# Patient Record
Sex: Female | Born: 1945
Health system: Southern US, Community
[De-identification: ages and names within clinical notes are randomized; demographics above are authoritative.]

## PROBLEM LIST (undated history)

## (undated) DIAGNOSIS — N200 Calculus of kidney: Secondary | ICD-10-CM

## (undated) DIAGNOSIS — B019 Varicella without complication: Secondary | ICD-10-CM

## (undated) DIAGNOSIS — L570 Actinic keratosis: Secondary | ICD-10-CM

## (undated) DIAGNOSIS — T7840XA Allergy, unspecified, initial encounter: Secondary | ICD-10-CM

## (undated) HISTORY — PX: SHOULDER SURGERY: SHX246

## (undated) HISTORY — DX: Calculus of kidney: N20.0

## (undated) HISTORY — PX: KNEE ARTHROCENTESIS: SUR44

## (undated) HISTORY — DX: Allergy, unspecified, initial encounter: T78.40XA

## (undated) HISTORY — DX: Varicella without complication: B01.9

## (undated) HISTORY — DX: Actinic keratosis: L57.0

## (undated) HISTORY — PX: TONSILLECTOMY: SUR1361

---

## 1998-06-05 ENCOUNTER — Other Ambulatory Visit: Admission: RE | Admit: 1998-06-05 | Discharge: 1998-06-05 | Payer: Self-pay | Admitting: Obstetrics and Gynecology

## 1999-07-02 ENCOUNTER — Other Ambulatory Visit: Admission: RE | Admit: 1999-07-02 | Discharge: 1999-07-02 | Payer: Self-pay | Admitting: Obstetrics and Gynecology

## 2001-01-05 ENCOUNTER — Other Ambulatory Visit: Admission: RE | Admit: 2001-01-05 | Discharge: 2001-01-05 | Payer: Self-pay | Admitting: Obstetrics and Gynecology

## 2002-03-25 ENCOUNTER — Other Ambulatory Visit: Admission: RE | Admit: 2002-03-25 | Discharge: 2002-03-25 | Payer: Self-pay | Admitting: Obstetrics and Gynecology

## 2003-05-26 ENCOUNTER — Other Ambulatory Visit: Admission: RE | Admit: 2003-05-26 | Discharge: 2003-05-26 | Payer: Self-pay | Admitting: Obstetrics and Gynecology

## 2004-07-27 ENCOUNTER — Ambulatory Visit: Payer: Self-pay | Admitting: Obstetrics and Gynecology

## 2005-08-20 ENCOUNTER — Ambulatory Visit: Payer: Self-pay | Admitting: Obstetrics and Gynecology

## 2005-08-23 ENCOUNTER — Ambulatory Visit: Payer: Self-pay | Admitting: Obstetrics and Gynecology

## 2005-09-18 ENCOUNTER — Other Ambulatory Visit: Admission: RE | Admit: 2005-09-18 | Discharge: 2005-09-18 | Payer: Self-pay | Admitting: Radiology

## 2009-09-12 DIAGNOSIS — C4491 Basal cell carcinoma of skin, unspecified: Secondary | ICD-10-CM

## 2009-09-12 HISTORY — DX: Basal cell carcinoma of skin, unspecified: C44.91

## 2011-01-11 ENCOUNTER — Encounter: Payer: Self-pay | Admitting: Family Medicine

## 2011-01-11 ENCOUNTER — Ambulatory Visit (INDEPENDENT_AMBULATORY_CARE_PROVIDER_SITE_OTHER): Payer: PRIVATE HEALTH INSURANCE | Admitting: Family Medicine

## 2011-01-11 VITALS — BP 125/81 | HR 72 | Temp 97.7°F | Ht 70.0 in | Wt 135.0 lb

## 2011-01-11 DIAGNOSIS — M25559 Pain in unspecified hip: Secondary | ICD-10-CM

## 2011-01-11 DIAGNOSIS — M25551 Pain in right hip: Secondary | ICD-10-CM

## 2011-01-11 NOTE — Patient Instructions (Signed)
Your history, exam are consistent with what started as a piriformis strain 6 months ago that has not been properly rehabilitated. Along the way you have developed overuse/weakness of hip external rotators, abductors, and flexors. Physical therapy is the most important part of your treatment at this point and should help you significantly. Tennis ball massage is something to consider as well for 15 minutes at a time. Ice after doing physical therapy for 15 minutes at a time. Absolutely avoid hills and stairs as much as possible. Use pain as a guide for activities (walking, elliptical should be ok - would avoid running for the time being). Start with 3 sets of 10 of each exercise, advance to 20-30 then add ankle weights. Focus on hip abduction and hip rotation exercises until you start physical therapy. For piriformis stretches - pick 2 or 3 - hold for 20-30 seconds (do not push beyond stretch feeling) and do 3 times. Follow up with me in 6 weeks for a recheck on your status.

## 2011-01-11 NOTE — Progress Notes (Signed)
  Subjective:    Patient ID: Julie Rivera, female    DOB: July 15, 1945, 65 y.o.   MRN: [id number]  PCP: Dr. Halina Maidens  HPI 65 yo F here for right hip/buttock pain.  Patient is very active and plays tennis regularly. States about 6 months ago during a match she went to the side and felt a sharp pain in right buttock. Continued with current match and one more but had to stop. She rested, saw a physician at [facility] as well. Pain radiates at times down posterior right leg to the knee. No numbness or tingling. No back pain. No bowel/bladder dysfunction. Reports pain did improve after several weeks - went back to some like tennis then when she went to regular pace in tennis pain became severe again in this area. Went to a [facility] and the physician at [facility] - referred for physical therapy (hasn't started yet) and started on naprosyn. Noticed if she drinks plenty of fluids now, pain does not seem to radiate down posterior leg.  History reviewed. No pertinent past medical history.  No current outpatient prescriptions on file prior to visit.    Past Surgical History  Procedure Date  . Shoulder surgery     No Known Allergies  History   Social History  . Marital Status: Unknown    Spouse Name: N/A    Number of Children: N/A  . Years of Education: N/A   Occupational History  . Not on file.   Social History Main Topics  . Smoking status: Never Smoker   . Smokeless tobacco: Not on file  . Alcohol Use: Not on file  . Drug Use: Not on file  . Sexually Active: Not on file   Other Topics Concern  . Not on file   Social History Narrative  . No narrative on file    Family History  Problem Relation Age of Onset  . Hypertension Sister   . Sudden death Neg Hx   . Hyperlipidemia Neg Hx   . Heart attack Neg Hx   . Diabetes Neg Hx     BP 125/81  Pulse 72  Temp(Src) 97.7 F (36.5 C) (Oral)  Ht 5\' 10"  (1.778 m)  Wt 135 lb (61.236 kg)  BMI 19.37 kg/m2  Review of Systems See  HPI above.    Objective:   Physical Exam Gen: NAD  Back/R hip: No gross deformity, scoliosis. No paraspinal TTP.  No midline or bony TTP.  TTP deep right piriformis.  No tenderness hamstrings. FROM without pain. Strength 3+/5 right hip abduction, 4/5 with hip external rotation, 5-/5 with knee flexion at 90 and 30 degrees. 2+ MSRs in patellar and achilles tendons, equal bilaterally. Negative SLRs. Sensation intact to light touch bilaterally. Negative logroll bilateral hips Stretch in painful area reproduced with right piriformis stretch.  Negative fabers.  MSK u/s: No evidence bursal inflammation, muscle tear in location of pain deep at piriformis.  No abnormalities hamstring tendons.    Assessment & Plan:  1. Right hip pain - history and exam consistent with piriformis strain/spasms that she has not adequately rehabilitated to this point.  Associated weakness in right lower extremity muscle groups as well.  Discussed possibility of lumbar radiculopathy but no numbness/tingling and pain specific to deep in right buttock especially with her acute injury 6 months ago.  Start PT, strengthening exercises and stretches.  Continue with naproxen.  See instructions for further.

## 2011-01-11 NOTE — Assessment & Plan Note (Signed)
history and exam consistent with piriformis strain/spasms that she has not adequately rehabilitated to this point.  Associated weakness in right lower extremity muscle groups as well.  Discussed possibility of lumbar radiculopathy but no numbness/tingling and pain specific to deep in right buttock especially with her acute injury 6 months ago.  Start PT, strengthening exercises and stretches.  Continue with naproxen.  See instructions for further.

## 2011-02-18 ENCOUNTER — Other Ambulatory Visit: Payer: Self-pay | Admitting: Sports Medicine

## 2011-02-21 ENCOUNTER — Ambulatory Visit: Payer: PRIVATE HEALTH INSURANCE | Admitting: Family Medicine

## 2011-02-22 ENCOUNTER — Ambulatory Visit: Payer: PRIVATE HEALTH INSURANCE | Admitting: Family Medicine

## 2012-12-29 DIAGNOSIS — M25519 Pain in unspecified shoulder: Secondary | ICD-10-CM | POA: Insufficient documentation

## 2013-01-26 DIAGNOSIS — M7541 Impingement syndrome of right shoulder: Secondary | ICD-10-CM | POA: Insufficient documentation

## 2013-03-18 DIAGNOSIS — M7520 Bicipital tendinitis, unspecified shoulder: Secondary | ICD-10-CM | POA: Insufficient documentation

## 2013-03-18 DIAGNOSIS — M67919 Unspecified disorder of synovium and tendon, unspecified shoulder: Secondary | ICD-10-CM | POA: Insufficient documentation

## 2013-03-18 DIAGNOSIS — S43499A Other sprain of unspecified shoulder joint, initial encounter: Secondary | ICD-10-CM | POA: Insufficient documentation

## 2013-03-19 DIAGNOSIS — M75120 Complete rotator cuff tear or rupture of unspecified shoulder, not specified as traumatic: Secondary | ICD-10-CM | POA: Insufficient documentation

## 2013-05-10 DIAGNOSIS — Z1231 Encounter for screening mammogram for malignant neoplasm of breast: Secondary | ICD-10-CM | POA: Diagnosis not present

## 2013-06-01 DIAGNOSIS — Z1211 Encounter for screening for malignant neoplasm of colon: Secondary | ICD-10-CM | POA: Diagnosis not present

## 2013-06-01 DIAGNOSIS — K648 Other hemorrhoids: Secondary | ICD-10-CM | POA: Diagnosis not present

## 2013-06-01 DIAGNOSIS — K5730 Diverticulosis of large intestine without perforation or abscess without bleeding: Secondary | ICD-10-CM | POA: Diagnosis not present

## 2013-06-08 DIAGNOSIS — L820 Inflamed seborrheic keratosis: Secondary | ICD-10-CM | POA: Diagnosis not present

## 2013-06-08 DIAGNOSIS — Z85828 Personal history of other malignant neoplasm of skin: Secondary | ICD-10-CM | POA: Diagnosis not present

## 2013-07-15 DIAGNOSIS — M75120 Complete rotator cuff tear or rupture of unspecified shoulder, not specified as traumatic: Secondary | ICD-10-CM | POA: Diagnosis not present

## 2014-01-25 DIAGNOSIS — Z779 Other contact with and (suspected) exposures hazardous to health: Secondary | ICD-10-CM | POA: Diagnosis not present

## 2014-01-25 DIAGNOSIS — R231 Pallor: Secondary | ICD-10-CM | POA: Diagnosis not present

## 2014-03-07 DIAGNOSIS — D0439 Carcinoma in situ of skin of other parts of face: Secondary | ICD-10-CM | POA: Diagnosis not present

## 2014-03-07 DIAGNOSIS — L720 Epidermal cyst: Secondary | ICD-10-CM | POA: Diagnosis not present

## 2014-03-07 DIAGNOSIS — Z85828 Personal history of other malignant neoplasm of skin: Secondary | ICD-10-CM | POA: Diagnosis not present

## 2014-03-07 DIAGNOSIS — L578 Other skin changes due to chronic exposure to nonionizing radiation: Secondary | ICD-10-CM | POA: Diagnosis not present

## 2014-05-24 DIAGNOSIS — R002 Palpitations: Secondary | ICD-10-CM | POA: Diagnosis not present

## 2014-05-24 DIAGNOSIS — R079 Chest pain, unspecified: Secondary | ICD-10-CM | POA: Diagnosis not present

## 2014-05-30 DIAGNOSIS — Z1231 Encounter for screening mammogram for malignant neoplasm of breast: Secondary | ICD-10-CM | POA: Diagnosis not present

## 2014-06-07 DIAGNOSIS — R079 Chest pain, unspecified: Secondary | ICD-10-CM | POA: Diagnosis not present

## 2014-06-14 DIAGNOSIS — R079 Chest pain, unspecified: Secondary | ICD-10-CM | POA: Diagnosis not present

## 2014-06-14 DIAGNOSIS — I340 Nonrheumatic mitral (valve) insufficiency: Secondary | ICD-10-CM | POA: Diagnosis not present

## 2014-07-01 DIAGNOSIS — R921 Mammographic calcification found on diagnostic imaging of breast: Secondary | ICD-10-CM | POA: Diagnosis not present

## 2014-07-13 DIAGNOSIS — Z0000 Encounter for general adult medical examination without abnormal findings: Secondary | ICD-10-CM | POA: Diagnosis not present

## 2014-07-13 DIAGNOSIS — N6012 Diffuse cystic mastopathy of left breast: Secondary | ICD-10-CM | POA: Diagnosis not present

## 2014-07-13 DIAGNOSIS — R920 Mammographic microcalcification found on diagnostic imaging of breast: Secondary | ICD-10-CM | POA: Diagnosis not present

## 2014-07-26 DIAGNOSIS — H2513 Age-related nuclear cataract, bilateral: Secondary | ICD-10-CM | POA: Diagnosis not present

## 2014-08-04 DIAGNOSIS — M1990 Unspecified osteoarthritis, unspecified site: Secondary | ICD-10-CM | POA: Diagnosis not present

## 2014-08-04 DIAGNOSIS — M19019 Primary osteoarthritis, unspecified shoulder: Secondary | ICD-10-CM | POA: Insufficient documentation

## 2014-09-21 DIAGNOSIS — M23301 Other meniscus derangements, unspecified lateral meniscus, left knee: Secondary | ICD-10-CM | POA: Diagnosis not present

## 2014-09-21 DIAGNOSIS — M11262 Other chondrocalcinosis, left knee: Secondary | ICD-10-CM | POA: Diagnosis not present

## 2014-09-21 DIAGNOSIS — M25562 Pain in left knee: Secondary | ICD-10-CM | POA: Diagnosis not present

## 2014-09-23 DIAGNOSIS — M23301 Other meniscus derangements, unspecified lateral meniscus, left knee: Secondary | ICD-10-CM | POA: Diagnosis not present

## 2014-09-23 DIAGNOSIS — M25562 Pain in left knee: Secondary | ICD-10-CM | POA: Diagnosis not present

## 2014-09-23 DIAGNOSIS — S83289A Other tear of lateral meniscus, current injury, unspecified knee, initial encounter: Secondary | ICD-10-CM | POA: Diagnosis not present

## 2014-10-05 DIAGNOSIS — S83282D Other tear of lateral meniscus, current injury, left knee, subsequent encounter: Secondary | ICD-10-CM | POA: Diagnosis not present

## 2014-10-05 DIAGNOSIS — M794 Hypertrophy of (infrapatellar) fat pad: Secondary | ICD-10-CM | POA: Diagnosis not present

## 2014-10-18 DIAGNOSIS — M65862 Other synovitis and tenosynovitis, left lower leg: Secondary | ICD-10-CM | POA: Diagnosis not present

## 2014-10-18 DIAGNOSIS — M2242 Chondromalacia patellae, left knee: Secondary | ICD-10-CM | POA: Diagnosis not present

## 2014-10-18 DIAGNOSIS — M6752 Plica syndrome, left knee: Secondary | ICD-10-CM | POA: Diagnosis not present

## 2014-10-18 DIAGNOSIS — S83282A Other tear of lateral meniscus, current injury, left knee, initial encounter: Secondary | ICD-10-CM | POA: Diagnosis not present

## 2014-10-18 DIAGNOSIS — M11262 Other chondrocalcinosis, left knee: Secondary | ICD-10-CM | POA: Diagnosis not present

## 2014-10-18 DIAGNOSIS — M1120 Other chondrocalcinosis, unspecified site: Secondary | ICD-10-CM | POA: Diagnosis not present

## 2014-10-18 DIAGNOSIS — M23242 Derangement of anterior horn of lateral meniscus due to old tear or injury, left knee: Secondary | ICD-10-CM | POA: Diagnosis not present

## 2014-10-18 DIAGNOSIS — S83282D Other tear of lateral meniscus, current injury, left knee, subsequent encounter: Secondary | ICD-10-CM | POA: Diagnosis not present

## 2014-10-18 DIAGNOSIS — M794 Hypertrophy of (infrapatellar) fat pad: Secondary | ICD-10-CM | POA: Diagnosis not present

## 2014-11-30 DIAGNOSIS — M79645 Pain in left finger(s): Secondary | ICD-10-CM | POA: Diagnosis not present

## 2014-11-30 DIAGNOSIS — M189 Osteoarthritis of first carpometacarpal joint, unspecified: Secondary | ICD-10-CM | POA: Diagnosis not present

## 2015-01-11 DIAGNOSIS — M659 Synovitis and tenosynovitis, unspecified: Secondary | ICD-10-CM | POA: Diagnosis not present

## 2015-02-08 DIAGNOSIS — M794 Hypertrophy of (infrapatellar) fat pad: Secondary | ICD-10-CM | POA: Diagnosis not present

## 2015-02-08 DIAGNOSIS — S83282D Other tear of lateral meniscus, current injury, left knee, subsequent encounter: Secondary | ICD-10-CM | POA: Diagnosis not present

## 2015-02-08 DIAGNOSIS — M2242 Chondromalacia patellae, left knee: Secondary | ICD-10-CM | POA: Diagnosis not present

## 2015-02-08 DIAGNOSIS — M6752 Plica syndrome, left knee: Secondary | ICD-10-CM | POA: Diagnosis not present

## 2015-02-08 DIAGNOSIS — M1120 Other chondrocalcinosis, unspecified site: Secondary | ICD-10-CM | POA: Diagnosis not present

## 2015-02-22 DIAGNOSIS — M899 Disorder of bone, unspecified: Secondary | ICD-10-CM | POA: Diagnosis not present

## 2015-02-23 DIAGNOSIS — Z23 Encounter for immunization: Secondary | ICD-10-CM | POA: Diagnosis not present

## 2015-02-28 DIAGNOSIS — G8929 Other chronic pain: Secondary | ICD-10-CM | POA: Diagnosis not present

## 2015-02-28 DIAGNOSIS — M25562 Pain in left knee: Secondary | ICD-10-CM | POA: Diagnosis not present

## 2015-03-21 DIAGNOSIS — M25562 Pain in left knee: Secondary | ICD-10-CM | POA: Diagnosis not present

## 2015-03-21 DIAGNOSIS — G8929 Other chronic pain: Secondary | ICD-10-CM | POA: Diagnosis not present

## 2015-04-07 DIAGNOSIS — H2513 Age-related nuclear cataract, bilateral: Secondary | ICD-10-CM | POA: Diagnosis not present

## 2015-04-09 HISTORY — PX: BACK SURGERY: SHX140

## 2015-04-19 DIAGNOSIS — Z1231 Encounter for screening mammogram for malignant neoplasm of breast: Secondary | ICD-10-CM | POA: Diagnosis not present

## 2015-04-25 DIAGNOSIS — Z23 Encounter for immunization: Secondary | ICD-10-CM | POA: Diagnosis not present

## 2015-05-04 DIAGNOSIS — M7052 Other bursitis of knee, left knee: Secondary | ICD-10-CM | POA: Diagnosis not present

## 2015-08-03 DIAGNOSIS — M5416 Radiculopathy, lumbar region: Secondary | ICD-10-CM | POA: Diagnosis not present

## 2015-08-03 DIAGNOSIS — M47896 Other spondylosis, lumbar region: Secondary | ICD-10-CM | POA: Diagnosis not present

## 2015-08-07 DIAGNOSIS — M545 Low back pain: Secondary | ICD-10-CM | POA: Diagnosis not present

## 2015-08-15 DIAGNOSIS — M545 Low back pain: Secondary | ICD-10-CM | POA: Diagnosis not present

## 2015-08-22 DIAGNOSIS — M545 Low back pain: Secondary | ICD-10-CM | POA: Diagnosis not present

## 2015-09-07 DIAGNOSIS — M545 Low back pain: Secondary | ICD-10-CM | POA: Diagnosis not present

## 2015-09-22 DIAGNOSIS — M5416 Radiculopathy, lumbar region: Secondary | ICD-10-CM | POA: Diagnosis not present

## 2015-10-17 ENCOUNTER — Other Ambulatory Visit: Payer: Self-pay | Admitting: Orthopedic Surgery

## 2015-10-17 DIAGNOSIS — M47816 Spondylosis without myelopathy or radiculopathy, lumbar region: Secondary | ICD-10-CM

## 2015-11-03 DIAGNOSIS — M47896 Other spondylosis, lumbar region: Secondary | ICD-10-CM | POA: Diagnosis not present

## 2015-11-09 DIAGNOSIS — S83282D Other tear of lateral meniscus, current injury, left knee, subsequent encounter: Secondary | ICD-10-CM | POA: Diagnosis not present

## 2015-11-09 DIAGNOSIS — M6752 Plica syndrome, left knee: Secondary | ICD-10-CM | POA: Diagnosis not present

## 2015-11-09 DIAGNOSIS — M2242 Chondromalacia patellae, left knee: Secondary | ICD-10-CM | POA: Diagnosis not present

## 2015-11-09 DIAGNOSIS — M47896 Other spondylosis, lumbar region: Secondary | ICD-10-CM | POA: Diagnosis not present

## 2015-11-09 DIAGNOSIS — M5416 Radiculopathy, lumbar region: Secondary | ICD-10-CM | POA: Diagnosis not present

## 2015-11-25 DIAGNOSIS — Z91030 Bee allergy status: Secondary | ICD-10-CM | POA: Diagnosis not present

## 2015-12-07 DIAGNOSIS — M5416 Radiculopathy, lumbar region: Secondary | ICD-10-CM | POA: Diagnosis not present

## 2015-12-07 DIAGNOSIS — M4807 Spinal stenosis, lumbosacral region: Secondary | ICD-10-CM | POA: Diagnosis not present

## 2015-12-20 DIAGNOSIS — Z23 Encounter for immunization: Secondary | ICD-10-CM | POA: Diagnosis not present

## 2016-01-02 DIAGNOSIS — M5416 Radiculopathy, lumbar region: Secondary | ICD-10-CM | POA: Diagnosis not present

## 2016-01-02 DIAGNOSIS — M7130 Other bursal cyst, unspecified site: Secondary | ICD-10-CM | POA: Diagnosis not present

## 2016-01-02 DIAGNOSIS — M47896 Other spondylosis, lumbar region: Secondary | ICD-10-CM | POA: Diagnosis not present

## 2016-01-05 DIAGNOSIS — L578 Other skin changes due to chronic exposure to nonionizing radiation: Secondary | ICD-10-CM | POA: Diagnosis not present

## 2016-01-05 DIAGNOSIS — Z85828 Personal history of other malignant neoplasm of skin: Secondary | ICD-10-CM | POA: Diagnosis not present

## 2016-01-05 DIAGNOSIS — L570 Actinic keratosis: Secondary | ICD-10-CM | POA: Diagnosis not present

## 2016-01-09 DIAGNOSIS — R030 Elevated blood-pressure reading, without diagnosis of hypertension: Secondary | ICD-10-CM | POA: Diagnosis not present

## 2016-01-09 DIAGNOSIS — Z681 Body mass index (BMI) 19 or less, adult: Secondary | ICD-10-CM | POA: Diagnosis not present

## 2016-01-09 DIAGNOSIS — M7138 Other bursal cyst, other site: Secondary | ICD-10-CM | POA: Diagnosis not present

## 2016-01-09 DIAGNOSIS — M4316 Spondylolisthesis, lumbar region: Secondary | ICD-10-CM | POA: Diagnosis not present

## 2016-01-09 DIAGNOSIS — M48062 Spinal stenosis, lumbar region with neurogenic claudication: Secondary | ICD-10-CM | POA: Diagnosis not present

## 2016-01-09 DIAGNOSIS — M5416 Radiculopathy, lumbar region: Secondary | ICD-10-CM | POA: Diagnosis not present

## 2016-02-12 DIAGNOSIS — M1120 Other chondrocalcinosis, unspecified site: Secondary | ICD-10-CM | POA: Diagnosis not present

## 2016-02-12 DIAGNOSIS — M25562 Pain in left knee: Secondary | ICD-10-CM | POA: Diagnosis not present

## 2016-02-12 DIAGNOSIS — S83282D Other tear of lateral meniscus, current injury, left knee, subsequent encounter: Secondary | ICD-10-CM | POA: Diagnosis not present

## 2016-02-12 DIAGNOSIS — M1712 Unilateral primary osteoarthritis, left knee: Secondary | ICD-10-CM | POA: Diagnosis not present

## 2016-02-12 DIAGNOSIS — M2242 Chondromalacia patellae, left knee: Secondary | ICD-10-CM | POA: Diagnosis not present

## 2016-02-15 DIAGNOSIS — M7138 Other bursal cyst, other site: Secondary | ICD-10-CM | POA: Diagnosis not present

## 2016-02-15 DIAGNOSIS — Z981 Arthrodesis status: Secondary | ICD-10-CM | POA: Diagnosis not present

## 2016-02-15 DIAGNOSIS — M48061 Spinal stenosis, lumbar region without neurogenic claudication: Secondary | ICD-10-CM | POA: Diagnosis not present

## 2016-02-16 DIAGNOSIS — M7138 Other bursal cyst, other site: Secondary | ICD-10-CM | POA: Diagnosis not present

## 2016-02-16 DIAGNOSIS — M48061 Spinal stenosis, lumbar region without neurogenic claudication: Secondary | ICD-10-CM | POA: Diagnosis not present

## 2016-03-06 DIAGNOSIS — H35363 Drusen (degenerative) of macula, bilateral: Secondary | ICD-10-CM | POA: Diagnosis not present

## 2016-03-06 DIAGNOSIS — H2513 Age-related nuclear cataract, bilateral: Secondary | ICD-10-CM | POA: Diagnosis not present

## 2016-03-06 DIAGNOSIS — H25013 Cortical age-related cataract, bilateral: Secondary | ICD-10-CM | POA: Diagnosis not present

## 2016-03-14 ENCOUNTER — Ambulatory Visit (INDEPENDENT_AMBULATORY_CARE_PROVIDER_SITE_OTHER): Payer: Medicare Other | Admitting: Family

## 2016-03-14 ENCOUNTER — Encounter: Payer: Self-pay | Admitting: Family

## 2016-03-14 VITALS — BP 116/86 | HR 79 | Temp 97.6°F | Ht 69.0 in | Wt 139.2 lb

## 2016-03-14 DIAGNOSIS — G4700 Insomnia, unspecified: Secondary | ICD-10-CM | POA: Diagnosis not present

## 2016-03-14 DIAGNOSIS — Z7689 Persons encountering health services in other specified circumstances: Secondary | ICD-10-CM

## 2016-03-14 DIAGNOSIS — Z136 Encounter for screening for cardiovascular disorders: Secondary | ICD-10-CM | POA: Insufficient documentation

## 2016-03-14 NOTE — Assessment & Plan Note (Signed)
Reviewed past medical, social, family history with patient. She'll return for CPE in 2018.

## 2016-03-14 NOTE — Progress Notes (Signed)
Subjective:    Patient ID: Julie Rivera, female    DOB: 30-Aug-1945, 70 y.o.   MRN: [id number]  CC: Julie Rivera is a 70 y.o. female who presents today to establish care.    HPI: Here to establish care as new patient. No prior PCP.   Insomnia- Wilburn Cornelia once every 2 months when husband's snoring 'keeps her up'. No refills needed at this time. No strange dreams, grogginess from medication.   Low back pain- chronic.  follows with orthopedic, Dr Jefferey Pica.3 weeks ago cyst removed from lumbar spine. Takes tramadol PRN.   Follows with GYN, Dr. Elvis Coil who prescribed HRT. Will do pap with her next year.      HISTORY:  Past Medical History:  Diagnosis Date  . Allergy   . Chicken pox   . Kidney stones    Past Surgical History:  Procedure Laterality Date  . BACK SURGERY    . KNEE ARTHROCENTESIS    . SHOULDER SURGERY     Family History  Problem Relation Age of Onset  . Hypertension Sister   . Sudden death Neg Hx   . Hyperlipidemia Neg Hx   . Heart attack Neg Hx   . Diabetes Neg Hx     Allergies: Patient has no known allergies. No current outpatient prescriptions on file prior to visit.   No current facility-administered medications on file prior to visit.     Social History  Substance Use Topics  . Smoking status: Never Smoker  . Smokeless tobacco: Never Used  . Alcohol use No    Review of Systems  Constitutional: Negative for chills and fever.  Respiratory: Negative for cough.   Cardiovascular: Negative for chest pain and palpitations.  Gastrointestinal: Negative for nausea and vomiting.  Psychiatric/Behavioral: Positive for sleep disturbance (occasional).      Objective:    BP 116/86   Pulse 79   Temp 97.6 F (36.4 C) (Oral)   Ht 5\' 9"  (1.753 m)   Wt 139 lb 3.2 oz (63.1 kg)   LMP 02/07/2016   SpO2 98%   BMI 20.56 kg/m  BP Readings from Last 3 Encounters:  03/14/16 116/86  01/11/11 125/81   Wt Readings from Last 3 Encounters:    03/14/16 139 lb 3.2 oz (63.1 kg)  01/11/11 135 lb (61.2 kg)    Physical Exam  Constitutional: She appears well-developed and well-nourished.  Eyes: Conjunctivae are normal.  Cardiovascular: Normal rate, regular rhythm, normal heart sounds and normal pulses.   Pulmonary/Chest: Effort normal and breath sounds normal. She has no wheezes. She has no rhonchi. She has no rales.  Neurological: She is alert.  Skin: Skin is warm and dry.  Psychiatric: She has a normal mood and affect. Her speech is normal and behavior is normal. Thought content normal.  Vitals reviewed.      Assessment & Plan:   Problem List Items Addressed This Visit      Other   Insomnia - Primary    Wellcontrolled with occasional use of Ambien. No AE. Will continue medication.      Encounter to establish care    Reviewed past medical, social, family history with patient. She'll return for CPE in 2018.          I have discontinued Ms. Paci's calcium carbonate, fish oil-omega-3 fatty acids, multivitamin, and naproxen. I am also having her maintain her conjugated estrogens-medroxyprogesteron, zolpidem, and traMADol.   Meds ordered this encounter  Medications  . conjugated estrogens-medroxyprogesteron (PREMPHASE) TABS  tablet    Sig: Take by mouth.  . zolpidem (AMBIEN) 5 MG tablet    Sig: Take 5 mg by mouth at bedtime as needed for sleep.  . traMADol (ULTRAM) 50 MG tablet    Sig: Take by mouth every 6 (six) hours as needed.    Return precautions given.   Risks, benefits, and alternatives of the medications and treatment plan prescribed today were discussed, and patient expressed understanding.   Education regarding symptom management and diagnosis given to patient on AVS.  Continue to follow with Mable Paris, FNP for routine health maintenance.   Julie Rivera and I agreed with plan.   Mable Paris, FNP

## 2016-03-14 NOTE — Assessment & Plan Note (Addendum)
Well controlled with occasional use of Ambien. No AE. Will continue medication.

## 2016-03-14 NOTE — Patient Instructions (Signed)
Pleasure meeting you.    Physical with fasting  Labs next year.

## 2016-03-14 NOTE — Progress Notes (Signed)
Pre visit review using our clinic review tool, if applicable. No additional management support is needed unless otherwise documented below in the visit note. 

## 2016-03-18 ENCOUNTER — Telehealth: Payer: Self-pay | Admitting: Family

## 2016-03-18 ENCOUNTER — Encounter: Payer: Self-pay | Admitting: Family

## 2016-03-18 DIAGNOSIS — Z79890 Hormone replacement therapy (postmenopausal): Secondary | ICD-10-CM

## 2016-03-18 NOTE — Telephone Encounter (Signed)
Patient stated she was seen last week to est care.  Patient stated she was going to stop going to OB/GYN. Please advise.

## 2016-03-18 NOTE — Telephone Encounter (Signed)
Pt called and was wondering if M. Arnett would refill a medication that she was getting from her Ob/Gyn. IT is conjugated estrogens-medroxyprogesteron (PREMPHASE) TABS tablet. Please advise, thank you!  Call pt @ [phone]

## 2016-03-18 NOTE — Telephone Encounter (Signed)
Ok to do so

## 2016-03-18 NOTE — Telephone Encounter (Signed)
Pt will need to schedule an appt to discuss HRT Sorry- just need to know entire h/o this before I am confortable prescribing. Otherwise, she could stay with GYN

## 2016-03-19 ENCOUNTER — Encounter: Payer: Self-pay | Admitting: Family

## 2016-03-19 DIAGNOSIS — Z79890 Hormone replacement therapy (postmenopausal): Secondary | ICD-10-CM | POA: Insufficient documentation

## 2016-03-19 MED ORDER — CONJ ESTROG-MEDROXYPROGEST ACE PO TABS: 1.0000 | ORAL_TABLET | Freq: Every day | ORAL | 1 refills | Status: DC

## 2016-03-19 NOTE — Telephone Encounter (Signed)
Spoke with pt-  Started HRT 56 yrs for hot flashes and wanted benefit of bone health which is 'worth the risk'.  No personal or family h/o ovarian, breast cancer.   Doesn't desire to see GYN anymore. Dr. Bayard MalesRonnald Ramp. Would like premphase refill from me.   Continues to have period since she started premphase 15+ years ago. Advised call GYN as concerned that this is abnormal.  She will call GYN and make an appt to refill premphase.   I dc/ed refill. pateint verbalized understanding

## 2016-03-25 DIAGNOSIS — M1712 Unilateral primary osteoarthritis, left knee: Secondary | ICD-10-CM | POA: Diagnosis not present

## 2016-03-25 DIAGNOSIS — M2242 Chondromalacia patellae, left knee: Secondary | ICD-10-CM | POA: Diagnosis not present

## 2016-03-25 DIAGNOSIS — S83282D Other tear of lateral meniscus, current injury, left knee, subsequent encounter: Secondary | ICD-10-CM | POA: Diagnosis not present

## 2016-03-27 DIAGNOSIS — R232 Flushing: Secondary | ICD-10-CM | POA: Diagnosis not present

## 2016-04-22 DIAGNOSIS — C4492 Squamous cell carcinoma of skin, unspecified: Secondary | ICD-10-CM

## 2016-04-22 DIAGNOSIS — L578 Other skin changes due to chronic exposure to nonionizing radiation: Secondary | ICD-10-CM | POA: Diagnosis not present

## 2016-04-22 DIAGNOSIS — Z1283 Encounter for screening for malignant neoplasm of skin: Secondary | ICD-10-CM | POA: Diagnosis not present

## 2016-04-22 DIAGNOSIS — Z85828 Personal history of other malignant neoplasm of skin: Secondary | ICD-10-CM | POA: Diagnosis not present

## 2016-04-22 DIAGNOSIS — D692 Other nonthrombocytopenic purpura: Secondary | ICD-10-CM | POA: Diagnosis not present

## 2016-04-22 DIAGNOSIS — L570 Actinic keratosis: Secondary | ICD-10-CM | POA: Diagnosis not present

## 2016-04-22 DIAGNOSIS — D485 Neoplasm of uncertain behavior of skin: Secondary | ICD-10-CM | POA: Diagnosis not present

## 2016-04-22 DIAGNOSIS — L820 Inflamed seborrheic keratosis: Secondary | ICD-10-CM | POA: Diagnosis not present

## 2016-04-22 DIAGNOSIS — L853 Xerosis cutis: Secondary | ICD-10-CM | POA: Diagnosis not present

## 2016-04-22 DIAGNOSIS — D0471 Carcinoma in situ of skin of right lower limb, including hip: Secondary | ICD-10-CM | POA: Diagnosis not present

## 2016-04-22 HISTORY — DX: Squamous cell carcinoma of skin, unspecified: C44.92

## 2016-04-24 DIAGNOSIS — M25551 Pain in right hip: Secondary | ICD-10-CM | POA: Diagnosis not present

## 2016-04-24 DIAGNOSIS — M545 Low back pain: Secondary | ICD-10-CM | POA: Diagnosis not present

## 2016-04-26 DIAGNOSIS — Z1231 Encounter for screening mammogram for malignant neoplasm of breast: Secondary | ICD-10-CM | POA: Diagnosis not present

## 2016-04-29 DIAGNOSIS — M545 Low back pain: Secondary | ICD-10-CM | POA: Diagnosis not present

## 2016-04-29 DIAGNOSIS — M25551 Pain in right hip: Secondary | ICD-10-CM | POA: Diagnosis not present

## 2016-05-01 DIAGNOSIS — M545 Low back pain: Secondary | ICD-10-CM | POA: Diagnosis not present

## 2016-05-01 DIAGNOSIS — M25551 Pain in right hip: Secondary | ICD-10-CM | POA: Diagnosis not present

## 2016-05-06 DIAGNOSIS — M25551 Pain in right hip: Secondary | ICD-10-CM | POA: Diagnosis not present

## 2016-05-06 DIAGNOSIS — M545 Low back pain: Secondary | ICD-10-CM | POA: Diagnosis not present

## 2016-05-08 DIAGNOSIS — M25551 Pain in right hip: Secondary | ICD-10-CM | POA: Diagnosis not present

## 2016-05-08 DIAGNOSIS — M545 Low back pain: Secondary | ICD-10-CM | POA: Diagnosis not present

## 2016-05-20 DIAGNOSIS — M25551 Pain in right hip: Secondary | ICD-10-CM | POA: Diagnosis not present

## 2016-05-20 DIAGNOSIS — M545 Low back pain: Secondary | ICD-10-CM | POA: Diagnosis not present

## 2016-05-29 DIAGNOSIS — M25551 Pain in right hip: Secondary | ICD-10-CM | POA: Diagnosis not present

## 2016-05-29 DIAGNOSIS — M545 Low back pain: Secondary | ICD-10-CM | POA: Diagnosis not present

## 2016-06-03 DIAGNOSIS — S83282D Other tear of lateral meniscus, current injury, left knee, subsequent encounter: Secondary | ICD-10-CM | POA: Diagnosis not present

## 2016-06-03 DIAGNOSIS — M25551 Pain in right hip: Secondary | ICD-10-CM | POA: Diagnosis not present

## 2016-06-03 DIAGNOSIS — M1120 Other chondrocalcinosis, unspecified site: Secondary | ICD-10-CM | POA: Diagnosis not present

## 2016-06-03 DIAGNOSIS — M2242 Chondromalacia patellae, left knee: Secondary | ICD-10-CM | POA: Diagnosis not present

## 2016-06-03 DIAGNOSIS — M1712 Unilateral primary osteoarthritis, left knee: Secondary | ICD-10-CM | POA: Diagnosis not present

## 2016-06-03 DIAGNOSIS — M545 Low back pain: Secondary | ICD-10-CM | POA: Diagnosis not present

## 2016-06-05 ENCOUNTER — Telehealth: Payer: Self-pay | Admitting: Family

## 2016-06-05 NOTE — Telephone Encounter (Signed)
I left pt a vm to call the office to sch AWV. Thank you!

## 2016-06-10 DIAGNOSIS — M545 Low back pain: Secondary | ICD-10-CM | POA: Diagnosis not present

## 2016-06-10 DIAGNOSIS — M25551 Pain in right hip: Secondary | ICD-10-CM | POA: Diagnosis not present

## 2016-06-12 DIAGNOSIS — M545 Low back pain: Secondary | ICD-10-CM | POA: Diagnosis not present

## 2016-06-12 DIAGNOSIS — M25551 Pain in right hip: Secondary | ICD-10-CM | POA: Diagnosis not present

## 2016-06-17 DIAGNOSIS — M25551 Pain in right hip: Secondary | ICD-10-CM | POA: Diagnosis not present

## 2016-06-17 DIAGNOSIS — M545 Low back pain: Secondary | ICD-10-CM | POA: Diagnosis not present

## 2016-06-19 DIAGNOSIS — M545 Low back pain: Secondary | ICD-10-CM | POA: Diagnosis not present

## 2016-06-19 DIAGNOSIS — M25551 Pain in right hip: Secondary | ICD-10-CM | POA: Diagnosis not present

## 2016-06-24 DIAGNOSIS — M545 Low back pain: Secondary | ICD-10-CM | POA: Diagnosis not present

## 2016-06-24 DIAGNOSIS — D0471 Carcinoma in situ of skin of right lower limb, including hip: Secondary | ICD-10-CM | POA: Diagnosis not present

## 2016-06-24 DIAGNOSIS — M25551 Pain in right hip: Secondary | ICD-10-CM | POA: Diagnosis not present

## 2016-06-30 NOTE — Progress Notes (Signed)
Subjective:    Patient ID: Julie Rivera, female    DOB: Mar 17, 1946, 71 y.o.   MRN: [id number]  CC: Julie Rivera is a 71 y.o. female who presents today for physical exam.    HPI: Insomnia- stable. Takes [place] as needed  HRT- still receives from [employer]. She started this medication years ago for bone health.   Low  Back pain- Takes PRN tramadol for chronic low back pain. Follows with Dr Jefferey Pica      Colorectal Cancer Screening: UTD 2017, no polyps. Returns in 10 years.  Breast Cancer Screening: Sent to [facility] 04/2016 Normal.  Cervical Cancer Screening: No longer. Last pap 2 years and states normal.  Bone Health screening/DEXA for 65+: No increased fracture risk. Had DEXA 20 years ago.  Lung Cancer Screening: Doesn't have 30 year pack year history and age > 1 years.       Tetanus - Due        Pneumococcal - Had pneumococcal vaccine at [facility].  Hepatitis C screening - Candidate for; declines  Labs: declines due to concern of Medicare  Not paying for.  Exercise: Gets regular exercise.  Alcohol use: None Smoking/tobacco use: Nonsmoker.  Regular dental exams: UTD Wears seat belt: Yes. Skin: Follows with dermatology. No melanoma.   HISTORY:  Past Medical History:  Diagnosis Date  . Allergy   . Chicken pox   . Kidney stones     Past Surgical History:  Procedure Laterality Date  . BACK SURGERY  2017  . KNEE ARTHROCENTESIS    . SHOULDER SURGERY     Family History  Problem Relation Age of Onset  . Hypertension Sister   . Sudden death Neg Hx   . Hyperlipidemia Neg Hx   . Heart attack Neg Hx   . Diabetes Neg Hx   . Ovarian cancer Neg Hx   . Breast cancer Neg Hx       ALLERGIES: Patient has no known allergies.  Current Outpatient Prescriptions on File Prior to Visit  Medication Sig Dispense Refill  . traMADol (ULTRAM) 50 MG tablet Take by mouth every 6 (six) hours as needed.    . zolpidem (AMBIEN) 5 MG tablet Take 5 mg by mouth at bedtime as needed  for sleep.     No current facility-administered medications on file prior to visit.     Social History  Substance Use Topics  . Smoking status: Never Smoker  . Smokeless tobacco: Never Used  . Alcohol use No    Review of Systems  Constitutional: Negative for chills, fever and unexpected weight change.  HENT: Negative for congestion.   Respiratory: Negative for cough.   Cardiovascular: Negative for chest pain, palpitations and leg swelling.  Gastrointestinal: Negative for nausea and vomiting.  Musculoskeletal: Negative for arthralgias and myalgias.  Skin: Negative for rash.  Neurological: Negative for headaches.  Hematological: Negative for adenopathy.  Psychiatric/Behavioral: Negative for confusion.      Objective:    BP 134/78   Pulse 63   Temp 97.6 F (36.4 C) (Oral)   Ht 5\' 9"  (1.753 m)   Wt 139 lb 14.4 oz (63.5 kg)   SpO2 97%   BMI 20.66 kg/m   BP Readings from Last 3 Encounters:  07/01/16 134/78  03/14/16 116/86  01/11/11 125/81   Wt Readings from Last 3 Encounters:  07/01/16 139 lb 14.4 oz (63.5 kg)  03/14/16 139 lb 3.2 oz (63.1 kg)  01/11/11 135 lb (61.2 kg)    Physical  Exam  Constitutional: She appears well-developed and well-nourished.  Eyes: Conjunctivae are normal.  Neck: No thyroid mass and no thyromegaly present.  Cardiovascular: Normal rate, regular rhythm, normal heart sounds and normal pulses.   Pulmonary/Chest: Effort normal and breath sounds normal. She has no wheezes. She has no rhonchi. She has no rales.  Lymphadenopathy:       Head (right side): No submental, no submandibular, no tonsillar, no preauricular, no posterior auricular and no occipital adenopathy present.       Head (left side): No submental, no submandibular, no tonsillar, no preauricular, no posterior auricular and no occipital adenopathy present.    She has no cervical adenopathy.  Neurological: She is alert.  Skin: Skin is warm and dry.  Psychiatric: She has a normal mood  and affect. Her speech is normal and behavior is normal. Thought content normal.  Vitals reviewed.      Assessment & Plan:   Problem List Items Addressed This Visit      Other   Insomnia - Primary    Doing well on ambien. Understands risks of medication. Takes very occasionally.  Will continue.       Encounter for screening and preventative care    UTD colonoscopy, mammogram. No longer does pap based on age and preference. Declines DEXA scan as would not want to start medication. Due for subsequent pneumococcal and tdap, advised to have done at local pharmacy. Concern as to whether medicare will cover screening labs, I advised to have them done however patient declines at this time. Follows with dermatology.       Hormone replacement therapy    On premphase. Takes for bone health per patient. No longer dose pap smear due to age and preference.       Low back pain    Chronic. Takes tramadol as needed for pain.       Relevant Medications   meloxicam (MOBIC) 7.5 MG tablet       I am having Ms. Stegner maintain her zolpidem, traMADol, meloxicam, and conjugated estrogens-medroxyprogesteron.   Meds ordered this encounter  Medications  . meloxicam (MOBIC) 7.5 MG tablet    Sig: Take 7.5 mg by mouth daily.  Marland Kitchen conjugated estrogens-medroxyprogesteron (PREMPHASE) TABS tablet    Sig: Take 1 tablet by mouth daily.    Return precautions given.   Risks, benefits, and alternatives of the medications and treatment plan prescribed today were discussed, and patient expressed understanding.   Education regarding symptom management and diagnosis given to patient on AVS.   Continue to follow with Mable Paris, FNP for routine health maintenance.   Julie Rivera and I agreed with plan.   Mable Paris, FNP

## 2016-07-01 ENCOUNTER — Ambulatory Visit (INDEPENDENT_AMBULATORY_CARE_PROVIDER_SITE_OTHER): Payer: Medicare Other | Admitting: Family

## 2016-07-01 ENCOUNTER — Encounter: Payer: Self-pay | Admitting: Family

## 2016-07-01 VITALS — BP 134/78 | HR 63 | Temp 97.6°F | Ht 69.0 in | Wt 139.9 lb

## 2016-07-01 DIAGNOSIS — M545 Low back pain, unspecified: Secondary | ICD-10-CM | POA: Insufficient documentation

## 2016-07-01 DIAGNOSIS — Z79890 Hormone replacement therapy (postmenopausal): Secondary | ICD-10-CM | POA: Diagnosis not present

## 2016-07-01 DIAGNOSIS — G4700 Insomnia, unspecified: Secondary | ICD-10-CM

## 2016-07-01 DIAGNOSIS — G8929 Other chronic pain: Secondary | ICD-10-CM

## 2016-07-01 DIAGNOSIS — Z0000 Encounter for general adult medical examination without abnormal findings: Secondary | ICD-10-CM

## 2016-07-01 NOTE — Assessment & Plan Note (Addendum)
On premphase. Takes for bone health per patient. No longer dose pap smear due to age and preference.

## 2016-07-01 NOTE — Patient Instructions (Addendum)
Great seeing you.  Please be sure to get subsequent pneumococcal vaccine and tdap ( tetanus)  at [facility].   Let me know if you need anything else.   Health Maintenance, Female Adopting a healthy lifestyle and getting preventive care can go a long way to promote health and wellness. Talk with your health care provider about what schedule of regular examinations is right for you. This is a good chance for you to check in with your provider about disease prevention and staying healthy. In between checkups, there are plenty of things you can do on your own. Experts have done a lot of research about which lifestyle changes and preventive measures are most likely to keep you healthy. Ask your health care provider for more information. Weight and diet Eat a healthy diet  Be sure to include plenty of vegetables, fruits, low-fat dairy products, and lean protein.  Do not eat a lot of foods high in solid fats, added sugars, or salt.  Get regular exercise. This is one of the most important things you can do for your health.  Most adults should exercise for at least 150 minutes each week. The exercise should increase your heart rate and make you sweat (moderate-intensity exercise).  Most adults should also do strengthening exercises at least twice a week. This is in addition to the moderate-intensity exercise. Maintain a healthy weight  Body mass index (BMI) is a measurement that can be used to identify possible weight problems. It estimates body fat based on height and weight. Your health care provider can help determine your BMI and help you achieve or maintain a healthy weight.  For females 43 years of age and older:  A BMI below 18.5 is considered underweight.  A BMI of 18.5 to 24.9 is normal.  A BMI of 25 to 29.9 is considered overweight.  A BMI of 30 and above is considered obese. Watch levels of cholesterol and blood lipids  You should start having your blood tested for lipids and  cholesterol at 70 years of age, then have this test every 5 years.  You may need to have your cholesterol levels checked more often if:  Your lipid or cholesterol levels are high.  You are older than 70 years of age.  You are at high risk for heart disease. Cancer screening Lung Cancer  Lung cancer screening is recommended for adults 65-51 years old who are at high risk for lung cancer because of a history of smoking.  A yearly low-dose CT scan of the lungs is recommended for people who:  Currently smoke.  Have quit within the past 15 years.  Have at least a 30-pack-year history of smoking. A pack year is smoking an average of one pack of cigarettes a day for 1 year.  Yearly screening should continue until it has been 15 years since you quit.  Yearly screening should stop if you develop a health problem that would prevent you from having lung cancer treatment. Breast Cancer  Practice breast self-awareness. This means understanding how your breasts normally appear and feel.  It also means doing regular breast self-exams. Let your health care provider know about any changes, no matter how small.  If you are in your 20s or 30s, you should have a clinical breast exam (CBE) by a health care provider every 1-3 years as part of a regular health exam.  If you are 26 or older, have a CBE every year. Also consider having a breast X-ray (mammogram) every  year.  If you have a family history of breast cancer, talk to your health care provider about genetic screening.  If you are at high risk for breast cancer, talk to your health care provider about having an MRI and a mammogram every year.  Breast cancer gene (BRCA) assessment is recommended for women who have family members with BRCA-related cancers. BRCA-related cancers include:  Breast.  Ovarian.  Tubal.  Peritoneal cancers.  Results of the assessment will determine the need for genetic counseling and BRCA1 and BRCA2  testing. Cervical Cancer  Your health care provider may recommend that you be screened regularly for cancer of the pelvic organs (ovaries, uterus, and vagina). This screening involves a pelvic examination, including checking for microscopic changes to the surface of your cervix (Pap test). You may be encouraged to have this screening done every 3 years, beginning at age 82.  For women ages 33-65, health care providers may recommend pelvic exams and Pap testing every 3 years, or they may recommend the Pap and pelvic exam, combined with testing for human papilloma virus (HPV), every 5 years. Some types of HPV increase your risk of cervical cancer. Testing for HPV may also be done on women of any age with unclear Pap test results.  Other health care providers may not recommend any screening for nonpregnant women who are considered low risk for pelvic cancer and who do not have symptoms. Ask your health care provider if a screening pelvic exam is right for you.  If you have had past treatment for cervical cancer or a condition that could lead to cancer, you need Pap tests and screening for cancer for at least 20 years after your treatment. If Pap tests have been discontinued, your risk factors (such as having a new sexual partner) need to be reassessed to determine if screening should resume. Some women have medical problems that increase the chance of getting cervical cancer. In these cases, your health care provider may recommend more frequent screening and Pap tests. Colorectal Cancer  This type of cancer can be detected and often prevented.  Routine colorectal cancer screening usually begins at 70 years of age and continues through 70 years of age.  Your health care provider may recommend screening at an earlier age if you have risk factors for colon cancer.  Your health care provider may also recommend using home test kits to check for hidden blood in the stool.  A small camera at the end of a  tube can be used to examine your colon directly (sigmoidoscopy or colonoscopy). This is done to check for the earliest forms of colorectal cancer.  Routine screening usually begins at age 69.  Direct examination of the colon should be repeated every 5-10 years through 70 years of age. However, you may need to be screened more often if early forms of precancerous polyps or small growths are found. Skin Cancer  Check your skin from head to toe regularly.  Tell your health care provider about any new moles or changes in moles, especially if there is a change in a mole's shape or color.  Also tell your health care provider if you have a mole that is larger than the size of a pencil eraser.  Always use sunscreen. Apply sunscreen liberally and repeatedly throughout the day.  Protect yourself by wearing long sleeves, pants, a wide-brimmed hat, and sunglasses whenever you are outside. Heart disease, diabetes, and high blood pressure  High blood pressure causes heart disease and increases  the risk of stroke. High blood pressure is more likely to develop in:  People who have blood pressure in the high end of the normal range (130-139/85-89 mm Hg).  People who are overweight or obese.  People who are African American.  If you are 75-35 years of age, have your blood pressure checked every 3-5 years. If you are 80 years of age or older, have your blood pressure checked every year. You should have your blood pressure measured twice-once when you are at a hospital or clinic, and once when you are not at a hospital or clinic. Record the average of the two measurements. To check your blood pressure when you are not at a hospital or clinic, you can use:  An automated blood pressure machine at a pharmacy.  A home blood pressure monitor.  If you are between 41 years and 32 years old, ask your health care provider if you should take aspirin to prevent strokes.  Have regular diabetes screenings. This  involves taking a blood sample to check your fasting blood sugar level.  If you are at a normal weight and have a low risk for diabetes, have this test once every three years after 70 years of age.  If you are overweight and have a high risk for diabetes, consider being tested at a younger age or more often. Preventing infection Hepatitis B  If you have a higher risk for hepatitis B, you should be screened for this virus. You are considered at high risk for hepatitis B if:  You were born in a country where hepatitis B is common. Ask your health care provider which countries are considered high risk.  Your parents were born in a high-risk country, and you have not been immunized against hepatitis B (hepatitis B vaccine).  You have HIV or AIDS.  You use needles to inject street drugs.  You live with someone who has hepatitis B.  You have had sex with someone who has hepatitis B.  You get hemodialysis treatment.  You take certain medicines for conditions, including cancer, organ transplantation, and autoimmune conditions. Hepatitis C  Blood testing is recommended for:  Everyone born from [birth year] through 1964.  Anyone with known risk factors for hepatitis C. Sexually transmitted infections (STIs)  You should be screened for sexually transmitted infections (STIs) including gonorrhea and chlamydia if:  You are sexually active and are younger than 70 years of age.  You are older than 70 years of age and your health care provider tells you that you are at risk for this type of infection.  Your sexual activity has changed since you were last screened and you are at an increased risk for chlamydia or gonorrhea. Ask your health care provider if you are at risk.  If you do not have HIV, but are at risk, it may be recommended that you take a prescription medicine daily to prevent HIV infection. This is called pre-exposure prophylaxis (PrEP). You are considered at risk if:  You are  sexually active and do not regularly use condoms or know the HIV status of your partner(s).  You take drugs by injection.  You are sexually active with a partner who has HIV. Talk with your health care provider about whether you are at high risk of being infected with HIV. If you choose to begin PrEP, you should first be tested for HIV. You should then be tested every 3 months for as long as you are taking PrEP. Pregnancy  If  you are premenopausal and you may become pregnant, ask your health care provider about preconception counseling.  If you may become pregnant, take 400 to 800 micrograms (mcg) of folic acid every day.  If you want to prevent pregnancy, talk to your health care provider about birth control (contraception). Osteoporosis and menopause  Osteoporosis is a disease in which the bones lose minerals and strength with aging. This can result in serious bone fractures. Your risk for osteoporosis can be identified using a bone density scan.  If you are 44 years of age or older, or if you are at risk for osteoporosis and fractures, ask your health care provider if you should be screened.  Ask your health care provider whether you should take a calcium or vitamin D supplement to lower your risk for osteoporosis.  Menopause may have certain physical symptoms and risks.  Hormone replacement therapy may reduce some of these symptoms and risks. Talk to your health care provider about whether hormone replacement therapy is right for you. Follow these instructions at home:  Schedule regular health, dental, and eye exams.  Stay current with your immunizations.  Do not use any tobacco products including cigarettes, chewing tobacco, or electronic cigarettes.  If you are pregnant, do not drink alcohol.  If you are breastfeeding, limit how much and how often you drink alcohol.  Limit alcohol intake to no more than 1 drink per day for nonpregnant women. One drink equals 12 ounces of  beer, 5 ounces of wine, or 1 ounces of hard liquor.  Do not use street drugs.  Do not share needles.  Ask your health care provider for help if you need support or information about quitting drugs.  Tell your health care provider if you often feel depressed.  Tell your health care provider if you have ever been abused or do not feel safe at home. This information is not intended to replace advice given to you by your health care provider. Make sure you discuss any questions you have with your health care provider. Document Released: 10/08/2010 Document Revised: 08/31/2015 Document Reviewed: 12/27/2014 Elsevier Interactive Patient Education  2017 [employer].

## 2016-07-01 NOTE — Assessment & Plan Note (Signed)
Chronic. Takes tramadol as needed for pain.

## 2016-07-01 NOTE — Assessment & Plan Note (Addendum)
UTD colonoscopy, mammogram. No longer does pap based on age and preference. Declines DEXA scan as would not want to start medication. Due for subsequent pneumococcal and tdap, advised to have done at local pharmacy. Concern as to whether medicare will cover screening labs, I advised to have them done however patient declines at this time. Follows with dermatology.

## 2016-07-01 NOTE — Progress Notes (Signed)
Pre visit review using our clinic review tool, if applicable. No additional management support is needed unless otherwise documented below in the visit note. 

## 2016-07-01 NOTE — Assessment & Plan Note (Addendum)
Doing well on ambien. Understands risks of medication. Takes very occasionally.  Will continue.

## 2016-07-05 DIAGNOSIS — Z23 Encounter for immunization: Secondary | ICD-10-CM | POA: Diagnosis not present

## 2016-07-07 ENCOUNTER — Telehealth: Payer: Self-pay | Admitting: Family

## 2016-07-07 NOTE — Telephone Encounter (Signed)
I

## 2016-07-08 ENCOUNTER — Telehealth: Payer: Self-pay | Admitting: Family

## 2016-07-08 NOTE — Telephone Encounter (Signed)
Left message for patient to return call back.  

## 2016-07-08 NOTE — Telephone Encounter (Signed)
Call pt-  I wanted to circle back as we didn't do labs last week for concern that medicare would not cover.   Most labs, they will not   Please advise patient that her plan will cover testing her cholesterol. If she would like  , we can order.   This is a fasting lab that she can do at her convenience.  Let me know

## 2016-07-10 NOTE — Telephone Encounter (Signed)
Patient stated that she does not wish to test for Cholesterol or be placed on medication,.

## 2016-07-12 DIAGNOSIS — M25551 Pain in right hip: Secondary | ICD-10-CM | POA: Diagnosis not present

## 2016-07-12 DIAGNOSIS — M545 Low back pain: Secondary | ICD-10-CM | POA: Diagnosis not present

## 2016-08-21 DIAGNOSIS — M48061 Spinal stenosis, lumbar region without neurogenic claudication: Secondary | ICD-10-CM | POA: Diagnosis not present

## 2016-08-21 DIAGNOSIS — M4310 Spondylolisthesis, site unspecified: Secondary | ICD-10-CM | POA: Diagnosis not present

## 2016-08-21 DIAGNOSIS — M545 Low back pain: Secondary | ICD-10-CM | POA: Diagnosis not present

## 2016-08-22 DIAGNOSIS — M961 Postlaminectomy syndrome, not elsewhere classified: Secondary | ICD-10-CM | POA: Diagnosis not present

## 2016-08-22 DIAGNOSIS — M5416 Radiculopathy, lumbar region: Secondary | ICD-10-CM | POA: Diagnosis not present

## 2016-08-22 DIAGNOSIS — M47816 Spondylosis without myelopathy or radiculopathy, lumbar region: Secondary | ICD-10-CM | POA: Diagnosis not present

## 2016-08-22 DIAGNOSIS — Z01812 Encounter for preprocedural laboratory examination: Secondary | ICD-10-CM | POA: Diagnosis not present

## 2016-08-27 ENCOUNTER — Telehealth: Payer: Self-pay | Admitting: Family

## 2016-08-27 DIAGNOSIS — G4700 Insomnia, unspecified: Secondary | ICD-10-CM

## 2016-08-27 NOTE — Telephone Encounter (Signed)
Spoke to pt. She is requesting refill on zolpidem (AMBIEN) 5 MG tablet  [facility]  Please advise

## 2016-08-27 NOTE — Telephone Encounter (Signed)
Spoke to pt. She declined to scheduled AWV at this time. Will be in and out of town for the summer. Pt would like me to call her back in September.

## 2016-08-28 DIAGNOSIS — M5416 Radiculopathy, lumbar region: Secondary | ICD-10-CM | POA: Diagnosis not present

## 2016-08-28 DIAGNOSIS — M47816 Spondylosis without myelopathy or radiculopathy, lumbar region: Secondary | ICD-10-CM | POA: Diagnosis not present

## 2016-08-28 MED ORDER — ZOLPIDEM TARTRATE 5 MG PO TABS: 5.0000 mg | ORAL_TABLET | Freq: Every evening | ORAL | 0 refills | Status: DC | PRN

## 2016-08-28 NOTE — Telephone Encounter (Signed)
done

## 2016-09-04 DIAGNOSIS — M25551 Pain in right hip: Secondary | ICD-10-CM | POA: Diagnosis not present

## 2016-09-04 DIAGNOSIS — M25552 Pain in left hip: Secondary | ICD-10-CM | POA: Diagnosis not present

## 2016-09-04 DIAGNOSIS — M961 Postlaminectomy syndrome, not elsewhere classified: Secondary | ICD-10-CM | POA: Diagnosis not present

## 2016-09-05 DIAGNOSIS — M47816 Spondylosis without myelopathy or radiculopathy, lumbar region: Secondary | ICD-10-CM | POA: Diagnosis not present

## 2016-09-05 DIAGNOSIS — M961 Postlaminectomy syndrome, not elsewhere classified: Secondary | ICD-10-CM | POA: Diagnosis not present

## 2016-09-05 DIAGNOSIS — M5416 Radiculopathy, lumbar region: Secondary | ICD-10-CM | POA: Diagnosis not present

## 2016-09-11 ENCOUNTER — Other Ambulatory Visit: Payer: Self-pay | Admitting: Physical Medicine and Rehabilitation

## 2016-09-12 ENCOUNTER — Other Ambulatory Visit: Payer: Self-pay | Admitting: Physical Medicine and Rehabilitation

## 2016-09-13 ENCOUNTER — Other Ambulatory Visit: Payer: Self-pay | Admitting: Physical Medicine and Rehabilitation

## 2016-09-13 DIAGNOSIS — R937 Abnormal findings on diagnostic imaging of other parts of musculoskeletal system: Secondary | ICD-10-CM

## 2016-10-04 ENCOUNTER — Ambulatory Visit
Admission: RE | Admit: 2016-10-04 | Discharge: 2016-10-04 | Disposition: A | Discharge status: Home / Self Care | Payer: Medicare Other | Source: Ambulatory Visit | Attending: Physical Medicine and Rehabilitation | Admitting: Physical Medicine and Rehabilitation

## 2016-10-04 DIAGNOSIS — R937 Abnormal findings on diagnostic imaging of other parts of musculoskeletal system: Secondary | ICD-10-CM

## 2016-10-04 DIAGNOSIS — R1907 Generalized intra-abdominal and pelvic swelling, mass and lump: Secondary | ICD-10-CM | POA: Diagnosis not present

## 2016-10-10 ENCOUNTER — Telehealth: Payer: Self-pay | Admitting: *Deleted

## 2016-10-10 NOTE — Telephone Encounter (Signed)
Patient was informed that we have not received the results.  Patient will have them resend the results to [place].

## 2016-10-10 NOTE — Telephone Encounter (Signed)
Patient has requested sonogram results , if available  Pt contact  [phone]

## 2016-10-21 DIAGNOSIS — Z85828 Personal history of other malignant neoplasm of skin: Secondary | ICD-10-CM | POA: Diagnosis not present

## 2016-10-21 DIAGNOSIS — L718 Other rosacea: Secondary | ICD-10-CM | POA: Diagnosis not present

## 2016-10-21 DIAGNOSIS — L570 Actinic keratosis: Secondary | ICD-10-CM | POA: Diagnosis not present

## 2016-10-21 DIAGNOSIS — L578 Other skin changes due to chronic exposure to nonionizing radiation: Secondary | ICD-10-CM | POA: Diagnosis not present

## 2016-11-01 ENCOUNTER — Other Ambulatory Visit: Payer: Self-pay | Admitting: Family

## 2016-11-01 DIAGNOSIS — M545 Low back pain, unspecified: Secondary | ICD-10-CM

## 2016-11-01 DIAGNOSIS — G8929 Other chronic pain: Secondary | ICD-10-CM

## 2016-11-01 NOTE — Telephone Encounter (Signed)
Refill request for Ultram, last seen [id number], last filled [id number].  Please advise.

## 2016-11-04 NOTE — Telephone Encounter (Signed)
Call pt  I thought her orthopedic, Dr Jefferey Pica wrote for her pain medication.  Please confirm

## 2016-11-05 ENCOUNTER — Telehealth: Payer: Self-pay | Admitting: Family

## 2016-11-05 NOTE — Telephone Encounter (Signed)
See RX note.

## 2016-11-05 NOTE — Telephone Encounter (Signed)
Spoke to patient, she stated that the medication was for her back surgery.  She hasnt needed any since then.  She was told it was easier if PCP followed the RX due to no longer seeing the surgeon.

## 2016-11-05 NOTE — Telephone Encounter (Signed)
Pt called back returning your call. Thank you! °

## 2016-11-05 NOTE — Telephone Encounter (Signed)
Left VM for patient to return call back

## 2016-11-05 NOTE — Telephone Encounter (Signed)
Left VM for patient to return call back.

## 2016-11-06 NOTE — Telephone Encounter (Signed)
Call pt-   Refilled for as needed use. I gave her 60 tabs. If feels like she needs a refill after this, please have her make an OV to discuss as it is a controlled substance       I looked up patient on [place] Controlled Substances Reporting System and saw no activity that raised concern of inappropriate use.

## 2016-11-15 DIAGNOSIS — M5416 Radiculopathy, lumbar region: Secondary | ICD-10-CM | POA: Diagnosis not present

## 2016-11-15 DIAGNOSIS — M961 Postlaminectomy syndrome, not elsewhere classified: Secondary | ICD-10-CM | POA: Diagnosis not present

## 2016-11-15 DIAGNOSIS — M47816 Spondylosis without myelopathy or radiculopathy, lumbar region: Secondary | ICD-10-CM | POA: Diagnosis not present

## 2016-12-10 ENCOUNTER — Other Ambulatory Visit: Payer: Self-pay | Admitting: Family

## 2016-12-10 DIAGNOSIS — M545 Low back pain, unspecified: Secondary | ICD-10-CM

## 2016-12-10 DIAGNOSIS — G8929 Other chronic pain: Secondary | ICD-10-CM

## 2016-12-10 NOTE — Telephone Encounter (Signed)
Last refilled on 11/06/2016, last OV was 07/01/2016, please advise, thanks

## 2016-12-11 ENCOUNTER — Telehealth: Payer: Self-pay | Admitting: Family

## 2016-12-11 DIAGNOSIS — M545 Low back pain, unspecified: Secondary | ICD-10-CM

## 2016-12-11 DIAGNOSIS — G8929 Other chronic pain: Secondary | ICD-10-CM

## 2016-12-11 MED ORDER — TRAMADOL HCL 50 MG PO TABS: 50.0000 mg | ORAL_TABLET | Freq: Two times a day (BID) | ORAL | 0 refills | Status: DC | PRN

## 2016-12-11 NOTE — Telephone Encounter (Signed)
Call pt Tramadol is a controlled substance and she is needs to be seen every 3-6 months for this medication Also, does she not receive this medication from Dr Jefferey Pica for her chronic low back pain?  I have given one month, no refills until she is seen.   I looked up patient on [place] Controlled Substances Reporting System and saw no activity that raised concern of inappropriate use.

## 2016-12-11 NOTE — Telephone Encounter (Signed)
Patient has been informed. She has also scheduled appointment

## 2016-12-11 NOTE — Telephone Encounter (Signed)
Left message for patient to return call back.  

## 2016-12-13 DIAGNOSIS — Z681 Body mass index (BMI) 19 or less, adult: Secondary | ICD-10-CM | POA: Diagnosis not present

## 2016-12-13 DIAGNOSIS — M4316 Spondylolisthesis, lumbar region: Secondary | ICD-10-CM | POA: Diagnosis not present

## 2016-12-13 DIAGNOSIS — R030 Elevated blood-pressure reading, without diagnosis of hypertension: Secondary | ICD-10-CM | POA: Diagnosis not present

## 2016-12-17 ENCOUNTER — Encounter: Payer: Self-pay | Admitting: Family

## 2016-12-17 ENCOUNTER — Ambulatory Visit (INDEPENDENT_AMBULATORY_CARE_PROVIDER_SITE_OTHER): Payer: Medicare Other | Admitting: Family

## 2016-12-17 VITALS — BP 140/80 | HR 83 | Temp 98.7°F | Ht 69.0 in | Wt 137.2 lb

## 2016-12-17 DIAGNOSIS — M545 Low back pain, unspecified: Secondary | ICD-10-CM

## 2016-12-17 DIAGNOSIS — M5416 Radiculopathy, lumbar region: Secondary | ICD-10-CM | POA: Diagnosis not present

## 2016-12-17 DIAGNOSIS — G8929 Other chronic pain: Secondary | ICD-10-CM

## 2016-12-17 DIAGNOSIS — Z23 Encounter for immunization: Secondary | ICD-10-CM

## 2016-12-17 MED ORDER — LIDOCAINE 5 % EX PTCH: 1.0000 | MEDICATED_PATCH | CUTANEOUS | 0 refills | Status: DC

## 2016-12-17 MED ORDER — DICLOFENAC SODIUM 75 MG PO TBEC: 75.0000 mg | DELAYED_RELEASE_TABLET | Freq: Two times a day (BID) | ORAL | 1 refills | Status: DC

## 2016-12-17 NOTE — Patient Instructions (Signed)
Trial lidocaine patches  Ensure you are taking diclofenac NOT meloxicam.   Sorry for confusion!   Pleasure seeing you!

## 2016-12-17 NOTE — Progress Notes (Signed)
Subjective:    Patient ID: Julie Rivera, female    DOB: 01-19-1946, 71 y.o.   MRN: [id number]  CC: Julie Rivera is a 71 y.o. female who presents today for follow up and medication management.  HPI: Chronic low back pain- No pain today. Had prednisone injection recently which usually lasts for a couple of months. tramadol helps with low back pain- uses sparingly. Mainly after golf. Also take diclofenac ( not mobic) and would like a refill as this helps when symptoms flare. No numbness, tingling.   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 Surgery not as effective as she has hoped.   Dr Julie Rivera - [facility] who gets prednisone injection. Recently had lumbar MRI and XR pelvis with no source for pain per patient ( unable to see these images in Epic).        normal renal function 02/2016  HISTORY:  Past Medical History:  Diagnosis Date  . Allergy   . Chicken pox   . Kidney stones    Past Surgical History:  Procedure Laterality Date  . BACK SURGERY  2017  . KNEE ARTHROCENTESIS    . SHOULDER SURGERY     Family History  Problem Relation Age of Onset  . Hypertension Sister   . Sudden death Neg Hx   . Hyperlipidemia Neg Hx   . Heart attack Neg Hx   . Diabetes Neg Hx   . Ovarian cancer Neg Hx   . Breast cancer Neg Hx     Allergies: Patient has no known allergies. Current Outpatient Prescriptions on File Prior to Visit  Medication Sig Dispense Refill  . conjugated estrogens-medroxyprogesteron (PREMPHASE) TABS tablet Take 1 tablet by mouth daily.    . traMADol (ULTRAM) 50 MG tablet Take 1 tablet (50 mg total) by mouth 2 (two) times daily as needed. For pain 60 tablet 0  . zolpidem (AMBIEN) 5 MG tablet Take 1 tablet (5 mg total) by mouth at bedtime as needed for sleep. 30 tablet 0   No current facility-administered medications on file prior to visit.     Social History  Substance Use Topics  . Smoking status: Never Smoker  .  Smokeless tobacco: Never Used  . Alcohol use No    Review of Systems  Constitutional: Negative for chills and fever.  Respiratory: Negative for cough.   Cardiovascular: Negative for chest pain and palpitations.  Gastrointestinal: Negative for nausea and vomiting.  Musculoskeletal: Positive for back pain.  Neurological: Negative for weakness and numbness.      Objective:    BP 140/80   Pulse 83   Temp 98.7 F (37.1 C) (Oral)   Ht 5\' 9"  (1.753 m)   Wt 137 lb 3.2 oz (62.2 kg)   SpO2 96%   BMI 20.26 kg/m  BP Readings from Last 3 Encounters:  12/17/16 140/80  07/01/16 134/78  03/14/16 116/86   Wt Readings from Last 3 Encounters:  12/17/16 137 lb 3.2 oz (62.2 kg)  07/01/16 139 lb 14.4 oz (63.5 kg)  03/14/16 139 lb 3.2 oz (63.1 kg)    Physical Exam  Constitutional: She appears well-developed and well-nourished.  Eyes: Conjunctivae are normal.  Cardiovascular: Normal rate, regular rhythm, normal heart sounds and normal pulses.   Pulmonary/Chest: Effort normal and breath sounds normal. She has no wheezes. She has no rhonchi. She has no rales.  Neurological: She is alert.  Skin: Skin is warm and dry.  Psychiatric: She has a  normal mood and affect. Her speech is normal and behavior is normal. Thought content normal.  Vitals reviewed.      Assessment & Plan:   Problem List Items Addressed This Visit      Other   Low back pain - Primary    Chronic. Patient using tramadol rarely. Discussed risk of medication. Will continue. Will also maintain on diclofenac. Trial of lidocaine. Patient will let me know if symptoms not well controlled.       Relevant Medications   diclofenac (VOLTAREN) 75 MG EC tablet   lidocaine (LIDODERM) 5 %    Other Visit Diagnoses    Encounter for immunization       Relevant Orders   Flu vaccine HIGH DOSE PF (Completed)       I have discontinued [name]'s meloxicam. I am also having her start on diclofenac and lidocaine. Additionally, I  am having her maintain her conjugated estrogens-medroxyprogesteron, zolpidem, and traMADol.   Meds ordered this encounter  Medications  . diclofenac (VOLTAREN) 75 MG EC tablet    Sig: Take 1 tablet (75 mg total) by mouth 2 (two) times daily.    Dispense:  90 tablet    Refill:  1    Order Specific Question:   Supervising Provider    Answer:   Julie Rivera [[id number]]  . lidocaine (LIDODERM) 5 %    Sig: Place 1 patch onto the skin daily. Remove & Discard patch within 12 hours.    Dispense:  30 patch    Refill:  0    Order Specific Question:   Supervising Provider    Answer:   Julie Rivera [[id number]]    Return precautions given.   Risks, benefits, and alternatives of the medications and treatment plan prescribed today were discussed, and patient expressed understanding.   Education regarding symptom management and diagnosis given to patient on AVS.  Continue to follow with Julie Hawthorne, FNP for routine health maintenance.   Julie Rivera and I agreed with plan.   Julie Paris, FNP

## 2016-12-17 NOTE — Assessment & Plan Note (Signed)
Chronic. Patient using tramadol rarely. Discussed risk of medication. Will continue. Will also maintain on diclofenac. Trial of lidocaine. Patient will let me know if symptoms not well controlled.

## 2016-12-26 NOTE — Telephone Encounter (Signed)
Pt declined AWV. °

## 2017-01-08 DIAGNOSIS — M47816 Spondylosis without myelopathy or radiculopathy, lumbar region: Secondary | ICD-10-CM | POA: Diagnosis not present

## 2017-01-08 DIAGNOSIS — M961 Postlaminectomy syndrome, not elsewhere classified: Secondary | ICD-10-CM | POA: Diagnosis not present

## 2017-01-08 DIAGNOSIS — M5416 Radiculopathy, lumbar region: Secondary | ICD-10-CM | POA: Diagnosis not present

## 2017-01-14 DIAGNOSIS — M545 Low back pain: Secondary | ICD-10-CM | POA: Diagnosis not present

## 2017-01-14 DIAGNOSIS — M48061 Spinal stenosis, lumbar region without neurogenic claudication: Secondary | ICD-10-CM | POA: Diagnosis not present

## 2017-01-14 DIAGNOSIS — M7062 Trochanteric bursitis, left hip: Secondary | ICD-10-CM | POA: Diagnosis not present

## 2017-01-20 DIAGNOSIS — L720 Epidermal cyst: Secondary | ICD-10-CM | POA: Diagnosis not present

## 2017-01-20 DIAGNOSIS — L821 Other seborrheic keratosis: Secondary | ICD-10-CM | POA: Diagnosis not present

## 2017-01-20 DIAGNOSIS — Z85828 Personal history of other malignant neoplasm of skin: Secondary | ICD-10-CM | POA: Diagnosis not present

## 2017-01-20 DIAGNOSIS — D2239 Melanocytic nevi of other parts of face: Secondary | ICD-10-CM | POA: Diagnosis not present

## 2017-01-20 DIAGNOSIS — L570 Actinic keratosis: Secondary | ICD-10-CM | POA: Diagnosis not present

## 2017-01-21 DIAGNOSIS — M25552 Pain in left hip: Secondary | ICD-10-CM | POA: Diagnosis not present

## 2017-01-28 DIAGNOSIS — M25552 Pain in left hip: Secondary | ICD-10-CM | POA: Diagnosis not present

## 2017-02-11 ENCOUNTER — Other Ambulatory Visit: Payer: Self-pay | Admitting: Family

## 2017-02-11 DIAGNOSIS — M545 Low back pain, unspecified: Secondary | ICD-10-CM

## 2017-02-11 DIAGNOSIS — G8929 Other chronic pain: Secondary | ICD-10-CM

## 2017-02-11 NOTE — Telephone Encounter (Signed)
Patient requesting refill on tramadol  Last fiil 9/18.

## 2017-02-12 DIAGNOSIS — M25552 Pain in left hip: Secondary | ICD-10-CM | POA: Diagnosis not present

## 2017-02-12 NOTE — Telephone Encounter (Signed)
I looked up patient on [place] Controlled Substances Reporting System and saw no activity that raised concern of inappropriate use.   

## 2017-03-03 ENCOUNTER — Ambulatory Visit: Payer: Medicare Other | Admitting: Internal Medicine

## 2017-03-07 ENCOUNTER — Other Ambulatory Visit: Payer: Self-pay | Admitting: Family

## 2017-03-07 DIAGNOSIS — G8929 Other chronic pain: Secondary | ICD-10-CM

## 2017-03-07 DIAGNOSIS — M545 Low back pain, unspecified: Secondary | ICD-10-CM

## 2017-03-13 DIAGNOSIS — M25551 Pain in right hip: Secondary | ICD-10-CM | POA: Diagnosis not present

## 2017-03-13 DIAGNOSIS — M545 Low back pain: Secondary | ICD-10-CM | POA: Diagnosis not present

## 2017-03-13 DIAGNOSIS — M4316 Spondylolisthesis, lumbar region: Secondary | ICD-10-CM | POA: Diagnosis not present

## 2017-03-13 DIAGNOSIS — M47816 Spondylosis without myelopathy or radiculopathy, lumbar region: Secondary | ICD-10-CM | POA: Diagnosis not present

## 2017-04-14 ENCOUNTER — Other Ambulatory Visit: Payer: Self-pay | Admitting: Family

## 2017-04-14 DIAGNOSIS — M545 Low back pain, unspecified: Secondary | ICD-10-CM

## 2017-04-14 DIAGNOSIS — G8929 Other chronic pain: Secondary | ICD-10-CM

## 2017-04-15 NOTE — Telephone Encounter (Signed)
Refill request for Tramadol, last seen 12/11/16, last filled 02/12/2017.  Please advise.

## 2017-04-16 DIAGNOSIS — M4316 Spondylolisthesis, lumbar region: Secondary | ICD-10-CM | POA: Diagnosis not present

## 2017-04-16 DIAGNOSIS — M47816 Spondylosis without myelopathy or radiculopathy, lumbar region: Secondary | ICD-10-CM | POA: Diagnosis not present

## 2017-04-18 NOTE — Telephone Encounter (Signed)
Printed, signed and faxed.  

## 2017-05-05 DIAGNOSIS — Z1231 Encounter for screening mammogram for malignant neoplasm of breast: Secondary | ICD-10-CM | POA: Diagnosis not present

## 2017-05-09 ENCOUNTER — Other Ambulatory Visit: Payer: Self-pay | Admitting: *Deleted

## 2017-05-09 DIAGNOSIS — M545 Low back pain, unspecified: Secondary | ICD-10-CM

## 2017-05-09 DIAGNOSIS — G8929 Other chronic pain: Secondary | ICD-10-CM

## 2017-05-09 MED ORDER — DICLOFENAC SODIUM 75 MG PO TBEC: 75.0000 mg | DELAYED_RELEASE_TABLET | Freq: Two times a day (BID) | ORAL | 1 refills | Status: DC

## 2017-05-09 MED ORDER — CONJ ESTROG-MEDROXYPROGEST ACE PO TABS: 1.0000 | ORAL_TABLET | Freq: Every day | ORAL | 0 refills | Status: DC

## 2017-05-09 NOTE — Progress Notes (Unsigned)
Refill sent for vo

## 2017-06-03 DIAGNOSIS — M47816 Spondylosis without myelopathy or radiculopathy, lumbar region: Secondary | ICD-10-CM | POA: Diagnosis not present

## 2017-06-03 DIAGNOSIS — M4316 Spondylolisthesis, lumbar region: Secondary | ICD-10-CM | POA: Diagnosis not present

## 2017-06-05 ENCOUNTER — Other Ambulatory Visit: Payer: Self-pay | Admitting: Internal Medicine

## 2017-06-05 DIAGNOSIS — Z79890 Hormone replacement therapy (postmenopausal): Secondary | ICD-10-CM

## 2017-06-06 NOTE — Telephone Encounter (Signed)
Refilled: 05/09/2017 Last OV: 12/17/2016 Next OV: not scheduled

## 2017-06-09 NOTE — Telephone Encounter (Signed)
Call pt I believe her GYN refills this medication. I refilled for one month.  Please have GYN refill future refills.

## 2017-06-12 DIAGNOSIS — M7062 Trochanteric bursitis, left hip: Secondary | ICD-10-CM | POA: Diagnosis not present

## 2017-06-16 DIAGNOSIS — M1712 Unilateral primary osteoarthritis, left knee: Secondary | ICD-10-CM | POA: Diagnosis not present

## 2017-06-20 NOTE — Telephone Encounter (Signed)
Patient advised of below and verbalized understanding , she will contact Gyn for future refills

## 2017-06-30 DIAGNOSIS — L570 Actinic keratosis: Secondary | ICD-10-CM | POA: Diagnosis not present

## 2017-07-30 DIAGNOSIS — J019 Acute sinusitis, unspecified: Secondary | ICD-10-CM | POA: Diagnosis not present

## 2017-08-21 DIAGNOSIS — Z1283 Encounter for screening for malignant neoplasm of skin: Secondary | ICD-10-CM | POA: Diagnosis not present

## 2017-08-21 DIAGNOSIS — Z85828 Personal history of other malignant neoplasm of skin: Secondary | ICD-10-CM | POA: Diagnosis not present

## 2017-08-21 DIAGNOSIS — L820 Inflamed seborrheic keratosis: Secondary | ICD-10-CM | POA: Diagnosis not present

## 2017-08-21 DIAGNOSIS — L578 Other skin changes due to chronic exposure to nonionizing radiation: Secondary | ICD-10-CM | POA: Diagnosis not present

## 2017-08-21 DIAGNOSIS — L570 Actinic keratosis: Secondary | ICD-10-CM | POA: Diagnosis not present

## 2017-08-21 DIAGNOSIS — D692 Other nonthrombocytopenic purpura: Secondary | ICD-10-CM | POA: Diagnosis not present

## 2017-08-21 DIAGNOSIS — D1801 Hemangioma of skin and subcutaneous tissue: Secondary | ICD-10-CM | POA: Diagnosis not present

## 2017-08-21 DIAGNOSIS — L812 Freckles: Secondary | ICD-10-CM | POA: Diagnosis not present

## 2017-08-21 DIAGNOSIS — D225 Melanocytic nevi of trunk: Secondary | ICD-10-CM | POA: Diagnosis not present

## 2017-08-21 DIAGNOSIS — L821 Other seborrheic keratosis: Secondary | ICD-10-CM | POA: Diagnosis not present

## 2017-10-07 ENCOUNTER — Other Ambulatory Visit: Payer: Self-pay | Admitting: Physician Assistant

## 2017-10-07 DIAGNOSIS — M958 Other specified acquired deformities of musculoskeletal system: Secondary | ICD-10-CM

## 2017-10-07 DIAGNOSIS — R590 Localized enlarged lymph nodes: Secondary | ICD-10-CM | POA: Diagnosis not present

## 2017-10-10 ENCOUNTER — Ambulatory Visit
Admission: RE | Admit: 2017-10-10 | Discharge: 2017-10-10 | Disposition: A | Discharge status: Home / Self Care | Payer: Medicare Other | Source: Ambulatory Visit | Attending: Physician Assistant | Admitting: Physician Assistant

## 2017-10-10 DIAGNOSIS — M799 Soft tissue disorder, unspecified: Secondary | ICD-10-CM | POA: Insufficient documentation

## 2017-10-10 DIAGNOSIS — R221 Localized swelling, mass and lump, neck: Secondary | ICD-10-CM | POA: Diagnosis not present

## 2017-10-10 DIAGNOSIS — M958 Other specified acquired deformities of musculoskeletal system: Secondary | ICD-10-CM

## 2017-10-16 ENCOUNTER — Telehealth: Payer: Self-pay

## 2017-10-16 NOTE — Telephone Encounter (Signed)
Copied from [place] [phone]. Topic: Medicare AWV >> Oct 16, 2017 11:42 AM Mylinda Latina, NT wrote: Reason for CRM: Patient called and is requesting a n Medicare AWV appt. Patient would like a call back to discuss the dates and times  . Please call to schedule  CB# [phone].

## 2017-10-21 NOTE — Telephone Encounter (Signed)
Please advise 

## 2017-10-21 NOTE — Telephone Encounter (Signed)
Patient is calling because she has not received a call back to schedule this. Please advise.

## 2017-10-24 ENCOUNTER — Telehealth: Payer: Self-pay

## 2017-10-24 ENCOUNTER — Ambulatory Visit (INDEPENDENT_AMBULATORY_CARE_PROVIDER_SITE_OTHER): Payer: Medicare Other | Admitting: Cardiothoracic Surgery

## 2017-10-24 ENCOUNTER — Encounter: Payer: Self-pay | Admitting: Cardiothoracic Surgery

## 2017-10-24 VITALS — BP 139/83 | HR 73 | Temp 97.6°F | Resp 18 | Ht 69.0 in | Wt 134.2 lb

## 2017-10-24 DIAGNOSIS — M89319 Hypertrophy of bone, unspecified shoulder: Secondary | ICD-10-CM

## 2017-10-24 DIAGNOSIS — R590 Localized enlarged lymph nodes: Secondary | ICD-10-CM | POA: Diagnosis not present

## 2017-10-24 NOTE — Telephone Encounter (Signed)
Spoke with Marcie Bal in speciality and US guided FNA is sent to IR for review. Marcie Bal will call back once approved.

## 2017-10-24 NOTE — Patient Instructions (Addendum)
Your CT scan is scheduled for 11/12/17 @ [facility]. See address lised below.  [email or web address]   We will call you with the follow up appointment to see Dr.Oaks after your CT scan.

## 2017-10-24 NOTE — Progress Notes (Signed)
Patient ID: Julie Rivera, female   DOB: 09/30/1945, 72 y.o.   MRN: [id number]  Chief Complaint  Patient presents with  . New Patient (Initial Visit)    Right clavical lesion w/ possible enlarged lymph node w/neoplastic involvement    Referred By Dr. Mable Paris Reason for Referral right clavicular lesion  HPI Location, Quality, Duration, Severity, Timing, Context, Modifying Factors, Associated Signs and Symptoms.  Julie Rivera is a 72 y.o. female.  Her problems began about 1 month ago when she noticed some right shoulder pain and discomfort.  She also noticed an enlarged "lymph node" at her right clavicle.  She states that over the last month the lymph node has not enlarged but there is some tenderness associated with movement of the arm.  This is worse whenever she tries to play tennis but the pain has gradually relieved some and she is able to play tennis although with some mild discomfort.  The enlarged lymph node has not changed in size.  There is no warmth or redness associated with it.  There is no other associated enlarged nodes in her neck or axilla.  She has annual mammograms.  She is a lifelong non-smoker.  She has had right shoulder surgery many years ago and has had no problems with that.  She has had several other orthopedic related injuries and states that this pain is not related to any orthopedic injury or strain.  She did have an ultrasound done which revealed a soft tissue mass at the head of the right clavicle.  This was not felt to be a lymph node but a hypervascular mass.  She comes in today for evaluation.   Past Medical History:  Diagnosis Date  . Allergy   . Chicken pox   . Kidney stones     Past Surgical History:  Procedure Laterality Date  . BACK SURGERY  2017  . KNEE ARTHROCENTESIS    . SHOULDER SURGERY      Family History  Problem Relation Age of Onset  . Hypertension Sister   . Sudden death Neg Hx   . Hyperlipidemia Neg Hx   . Heart attack  Neg Hx   . Diabetes Neg Hx   . Ovarian cancer Neg Hx   . Breast cancer Neg Hx     Social History Social History   Tobacco Use  . Smoking status: Never Smoker  . Smokeless tobacco: Never Used  Substance Use Topics  . Alcohol use: No  . Drug use: No    No Known Allergies  Current Outpatient Medications  Medication Sig Dispense Refill  . PREMPHASE 0.625-5 MG TABS tablet TAKE 1 TABLET DAILY 28 tablet 0   No current facility-administered medications for this visit.       Review of Systems A complete review of systems was asked and was negative except for the following positive findings easy bruising  Blood pressure 139/83, pulse 73, temperature 97.6 F (36.4 C), temperature source Oral, resp. rate 18, height 5\' 9"  (1.753 m), weight 134 lb 3.2 oz (60.9 kg), SpO2 97 %.  Physical Exam CONSTITUTIONAL:  Pleasant, well-developed, well-nourished, and in no acute distress. EYES: Pupils equal and reactive to light, Sclera non-icteric EARS, NOSE, MOUTH AND THROAT:  The oropharynx was clear.  Dentition is good repair.  Oral mucosa pink and moist. LYMPH NODES:  Lymph nodes in the neck and axillae were normal RESPIRATORY:  Lungs were clear.  Normal respiratory effort without pathologic use of accessory muscles of respiration  CARDIOVASCULAR: Heart was regular without murmurs.  There were no carotid bruits. GI: The abdomen was soft, nontender, and nondistended. There were no palpable masses. There was no hepatosplenomegaly. There were normal bowel sounds in all quadrants. GU:  Rectal deferred.   MUSCULOSKELETAL:  Normal muscle strength and tone.  No clubbing or cyanosis.  At the medial aspect of the right clavicle and slightly inferior to the clavicle there is a palpable soft lesion which measures about 2 cm.  There is no erythema.  It appears to be fixed to the underlying clavicle but the skin is mobile over top of it. SKIN:  There were no pathologic skin lesions.  There were no nodules on  palpation. NEUROLOGIC:  Sensation is normal.  Cranial nerves are grossly intact. PSYCH:  Oriented to person, place and time.  Mood and affect are normal.  Data Reviewed Ultrasound report  I have personally reviewed the patient's imaging, laboratory findings and medical records.    Assessment    There is a right clavicular lesion which is new but unchanged over the last month.  I am concerned that this may represent a plasmacytoma or sarcoma of the clavicle.    Plan    I would like to obtain a chest CT with contrast.  I think that this would give [place] a better view of this area and also be helpful in planning diagnostic biopsy.  We will go ahead and obtain a chest CT and I will see her back once that is complete.       Nestor Lewandowsky, MD 10/24/2017, 8:16 AM

## 2017-10-27 ENCOUNTER — Other Ambulatory Visit: Payer: Self-pay

## 2017-10-27 DIAGNOSIS — R590 Localized enlarged lymph nodes: Secondary | ICD-10-CM

## 2017-10-27 NOTE — Progress Notes (Unsigned)
Patient notified.    CT Neck / CT Chest scheduled 11/12/17  Outpatient Imaging @ 7:45 am NPO 4 hours prior.

## 2017-10-28 ENCOUNTER — Telehealth: Payer: Self-pay | Admitting: Cardiothoracic Surgery

## 2017-10-28 ENCOUNTER — Telehealth: Payer: Self-pay | Admitting: Surgery

## 2017-10-28 NOTE — Telephone Encounter (Signed)
Gave patient number to reschedule the CT scan [phone]. Patient is requesting to have FNA done same week if needed. I let her know I have requested this as well due to her schedule. We will wait on the radiologist to make the decision as to weather a biopsy is needed once we have results of CT scan.

## 2017-10-28 NOTE — Telephone Encounter (Signed)
Patient would like to change her CT for 8/5 so she can get the biopsy same week. Please call

## 2017-11-10 ENCOUNTER — Encounter: Payer: Self-pay | Admitting: Radiology

## 2017-11-10 ENCOUNTER — Ambulatory Visit
Admission: RE | Admit: 2017-11-10 | Discharge: 2017-11-10 | Disposition: A | Discharge status: Home / Self Care | Payer: Medicare Other | Source: Ambulatory Visit | Attending: Cardiothoracic Surgery | Admitting: Cardiothoracic Surgery

## 2017-11-10 DIAGNOSIS — R221 Localized swelling, mass and lump, neck: Secondary | ICD-10-CM | POA: Diagnosis not present

## 2017-11-10 DIAGNOSIS — M11212 Other chondrocalcinosis, left shoulder: Secondary | ICD-10-CM | POA: Diagnosis not present

## 2017-11-10 DIAGNOSIS — M25411 Effusion, right shoulder: Secondary | ICD-10-CM | POA: Diagnosis not present

## 2017-11-10 DIAGNOSIS — R590 Localized enlarged lymph nodes: Secondary | ICD-10-CM | POA: Diagnosis not present

## 2017-11-10 DIAGNOSIS — M11211 Other chondrocalcinosis, right shoulder: Secondary | ICD-10-CM | POA: Diagnosis not present

## 2017-11-10 DIAGNOSIS — R918 Other nonspecific abnormal finding of lung field: Secondary | ICD-10-CM | POA: Insufficient documentation

## 2017-11-10 LAB — POCT I-STAT CREATININE: Creatinine, Ser: 0.8 mg/dL (ref 0.44–1.00)

## 2017-11-10 MED ORDER — IOHEXOL 300 MG/ML  SOLN
75.0000 mL | Freq: Once | INTRAMUSCULAR | Status: AC | PRN
  Administered 2017-11-10: 75 mL via INTRAVENOUS

## 2017-11-11 ENCOUNTER — Ambulatory Visit (INDEPENDENT_AMBULATORY_CARE_PROVIDER_SITE_OTHER): Payer: Medicare Other

## 2017-11-11 VITALS — BP 110/68 | HR 71 | Temp 97.9°F | Resp 12 | Ht 69.0 in | Wt 135.1 lb

## 2017-11-11 DIAGNOSIS — Z0000 Encounter for general adult medical examination without abnormal findings: Secondary | ICD-10-CM

## 2017-11-11 NOTE — Patient Instructions (Addendum)
  Ms. Talsma , Thank you for taking time to come for your Medicare Wellness Visit. I appreciate your ongoing commitment to your health goals. Please review the following plan we discussed and let me know if I can assist you in the future.   Follow up as needed.    Bring a copy of your [place] and/or Living Will to be scanned into chart.  Have a great day!  These are the goals we discussed: Goals    . DIET - INCREASE WATER INTAKE       This is a list of the screening recommended for you and due dates:  Health Maintenance  Topic Date Due  . Mammogram  04/17/1996  . Pneumonia vaccines (2 of 2 - PCV13) 07/05/2017  . Flu Shot  11/06/2017  .  Hepatitis C: One time screening is recommended by Center for Disease Control  (CDC) for  adults born from [birth year] through 1964.   11/12/2018*  . DEXA scan (bone density measurement)  11/26/2018*  . Colon Cancer Screening  11/26/2018*  . Tetanus Vaccine  07/06/2026  *Topic was postponed. The date shown is not the original due date.

## 2017-11-11 NOTE — Progress Notes (Signed)
Subjective:   Julie Rivera is a 72 y.o. female who presents for an Initial Medicare Annual Wellness Visit.  Review of Systems    No ROS.  Medicare Wellness Visit. Additional risk factors are reflected in the social history.  Cardiac Risk Factors include: advanced age (>75men, >102 women)     Objective:    Today's Vitals   11/11/17 1141  BP: 110/68  Pulse: 71  Resp: 12  Temp: 97.9 F (36.6 C)  TempSrc: Oral  SpO2: 95%  Weight: 135 lb 1.9 oz (61.3 kg)  Height: 5\' 9"  (1.753 m)   Body mass index is 19.95 kg/m.  Advanced Directives 11/11/2017  Does Patient Have a Medical Advance Directive? Yes  Type of Advance Directive [place]  Does patient want to make changes to medical advance directive? No - Patient declined  Copy of [place] in Chart? No - copy requested    Current Medications (verified) Outpatient Encounter Medications as of 11/11/2017  Medication Sig  . PREMPHASE 0.625-5 MG TABS tablet TAKE 1 TABLET DAILY   No facility-administered encounter medications on file as of 11/11/2017.     Allergies (verified) Patient has no known allergies.   History: Past Medical History:  Diagnosis Date  . Allergy   . Chicken pox   . Kidney stones    Past Surgical History:  Procedure Laterality Date  . BACK SURGERY  2017  . KNEE ARTHROCENTESIS    . SHOULDER SURGERY     Family History  Problem Relation Age of Onset  . Hypertension Sister   . Sudden death Neg Hx   . Hyperlipidemia Neg Hx   . Heart attack Neg Hx   . Diabetes Neg Hx   . Ovarian cancer Neg Hx   . Breast cancer Neg Hx    Social History   Socioeconomic History  . Marital status: Married    Spouse name: Not on file  . Number of children: Not on file  . Years of education: Not on file  . Highest education level: Not on file  Occupational History  . Not on file  Social Needs  . Financial resource strain: Not hard at all  . Food insecurity:    Worry: Never  true    Inability: Never true  . Transportation needs:    Medical: No    Non-medical: No  Tobacco Use  . Smoking status: Never Smoker  . Smokeless tobacco: Never Used  Substance and Sexual Activity  . Alcohol use: No  . Drug use: No  . Sexual activity: Not on file  Lifestyle  . Physical activity:    Days per week: 7 days    Minutes per session: 60 min  . Stress: Not at all  Relationships  . Social connections:    Talks on phone: Not on file    Gets together: Not on file    Attends religious service: Not on file    Active member of club or organization: Not on file    Attends meetings of clubs or organizations: Not on file    Relationship status: Not on file  Other Topics Concern  . Not on file  Social History Narrative   Production assistant, radio, retired      2 children      4 grandchildren             Tobacco Counseling Counseling given: Not Answered   Clinical Intake:  Pre-visit preparation completed: Yes  Pain :  No/denies pain     Nutritional Status: BMI of 19-24  Normal Diabetes: No  How often do you need to have someone help you when you read instructions, pamphlets, or other written materials from your doctor or pharmacy?: 1 - Never  Interpreter Needed?: No      Activities of Daily Living In your present state of health, do you have any difficulty performing the following activities: 11/11/2017  Hearing? N  Vision? N  Difficulty concentrating or making decisions? N  Walking or climbing stairs? N  Dressing or bathing? N  Doing errands, shopping? N  Preparing Food and eating ? N  Using the Toilet? N  In the past six months, have you accidently leaked urine? N  Do you have problems with loss of bowel control? N  Managing your Medications? N  Managing your Finances? N  Housekeeping or managing your Housekeeping? N  Some recent data might be hidden     Immunizations and Health Maintenance Immunization History  Administered Date(s) Administered   . DTaP 07/05/2016  . Influenza, High Dose Seasonal PF 12/17/2016  . Pneumococcal Polysaccharide-23 07/05/2016  . Tdap 07/05/2016   Health Maintenance Due  Topic Date Due  . MAMMOGRAM  04/17/1996  . PNA vac Low Risk Adult (2 of 2 - PCV13) 07/05/2017  . INFLUENZA VACCINE  11/06/2017    Patient Care Team: Burnard Hawthorne, FNP as PCP - General (Family Medicine)  Indicate any recent Medical Services you may have received from other than [facility] providers in the past year (date may be approximate).     Assessment:   This is a routine wellness examination for [place].  The goal of the wellness visit is to assist the patient how to close the gaps in care and create a preventative care plan for the patient.   The roster of all physicians providing medical care to patient is listed in the Snapshot section of the chart.  Osteoporosis risk reviewed.    Safety issues reviewed; Smoke and carbon monoxide detectors in the home. No firearms in the home. Wears seatbelts when driving or riding with others. No violence in the home.  They do not have excessive sun exposure.  Discussed the need for sun protection: hats, long sleeves and the use of sunscreen if there is significant sun exposure.  Patient is alert, normal appearance, oriented to person/place/and time. Correctly identified the president of the [place] and recalls of 3/3 words.Performs simple calculations and can read correct time from watch face. Displays appropriate judgement.  No new identified risk were noted.  No failures at ADL's or IADL's.    BMI- discussed the importance of a healthy diet, water intake and the benefits of aerobic exercise. She has a healthy low carb/cholesterol diet, adequate water intake and exercises 420 minutes per week.   Dental- every 6 months.  Eye- Visual acuity not assessed per patient preference since they have regular follow up with the ophthalmologist.   Sleep patterns- Sleeps 8-10 hours at night.   Wakes feeling rested.   Colonoscopy/Cologuard discussed.  EMMI provided regarding cologuard.  Follow up with pcp.   Hep c screening declined.   Mammogram- reports completed by K. Ronnald Ramp ([place]). Annual screenings.  Dexa scan declined.   Pneumovax 23 deferred per patient request.    Patient Concerns: Reports localized enlarged lymph nodes CT complete as arthritis. Pain with movement when playing tennis.  She would like to discuss possible options (consider having it drained).  Request tramadol for intermittent pain.  Follow up scheduled with pcp.   Hearing/Vision screen Hearing Screening Comments: Patient is able to hear conversational tones without difficulty.  No issues reported.   Vision Screening Comments: Followed by Dr. Ellin Mayhew Wears corrective lenses Last OV 2018 Visual acuity not assessed per patient preference since they have regular follow up with the ophthalmologist  Dietary issues and exercise activities discussed: Current Exercise Habits: Home exercise routine, Type of exercise: calisthenics;strength training/weights(tennis, golf), Time (Minutes): 60, Frequency (Times/Week): 7, Weekly Exercise (Minutes/Week): 420, Intensity: Intense  Goals    . DIET - INCREASE WATER INTAKE      Depression Screen PHQ 2/9 Scores 11/11/2017 07/01/2016 03/14/2016  PHQ - 2 Score 0 0 0    Fall Risk Fall Risk  11/11/2017 07/01/2016 03/14/2016  Falls in the past year? No No No   Cognitive Function: MMSE - Mini Mental State Exam 11/11/2017  Orientation to time 5  Orientation to Place 5  Registration 3  Attention/ Calculation 5  Recall 3  Language- name 2 objects 2  Language- repeat 1  Language- follow 3 step command 3  Language- read & follow direction 1  Write a sentence 1  Copy design 1  Total score 30        Screening Tests Health Maintenance  Topic Date Due  . MAMMOGRAM  04/17/1996  . PNA vac Low Risk Adult (2 of 2 - PCV13) 07/05/2017  . INFLUENZA VACCINE  11/06/2017  .  Hepatitis C Screening  11/12/2018 (Originally 1945-10-21)  . DEXA SCAN  11/26/2018 (Originally 04/18/2011)  . COLONOSCOPY  11/26/2018 (Originally 04/17/1996)  . TETANUS/TDAP  07/06/2026     Plan:   End of life planning; Advance aging; Advanced directives discussed. Copy of current HCPOA/Living Will requested.    I have personally reviewed and noted the following in the patient's chart:   . Medical and social history . Use of alcohol, tobacco or illicit drugs  . Current medications and supplements . Functional ability and status . Nutritional status . Physical activity . Advanced directives . List of other physicians . Hospitalizations, surgeries, and ER visits in previous 12 months . Vitals . Screenings to include cognitive, depression, and falls . Referrals and appointments  In addition, I have reviewed and discussed with patient certain preventive protocols, quality metrics, and best practice recommendations. A written personalized care plan for preventive services as well as general preventive health recommendations were provided to patient.     Varney Biles, LPN   09/12/3417

## 2017-11-12 ENCOUNTER — Ambulatory Visit: Payer: Medicare Other

## 2017-11-12 ENCOUNTER — Telehealth: Payer: Self-pay

## 2017-11-12 NOTE — Telephone Encounter (Signed)
Spoke with patient and reviewed results of CT chest per Dr.Oaks.  Patient to have CT chest w/o contrast February 2020 and follow up with Dr.Oaks. Message sent to recall box.

## 2017-12-22 ENCOUNTER — Ambulatory Visit: Payer: Medicare Other | Admitting: Family

## 2017-12-26 DIAGNOSIS — L820 Inflamed seborrheic keratosis: Secondary | ICD-10-CM | POA: Diagnosis not present

## 2017-12-26 DIAGNOSIS — Z85828 Personal history of other malignant neoplasm of skin: Secondary | ICD-10-CM | POA: Diagnosis not present

## 2017-12-26 DIAGNOSIS — L578 Other skin changes due to chronic exposure to nonionizing radiation: Secondary | ICD-10-CM | POA: Diagnosis not present

## 2017-12-26 DIAGNOSIS — L570 Actinic keratosis: Secondary | ICD-10-CM | POA: Diagnosis not present

## 2018-01-02 ENCOUNTER — Ambulatory Visit: Payer: Medicare Other | Admitting: Family

## 2018-01-12 ENCOUNTER — Ambulatory Visit (INDEPENDENT_AMBULATORY_CARE_PROVIDER_SITE_OTHER): Payer: Medicare Other | Admitting: Family

## 2018-01-12 ENCOUNTER — Encounter: Payer: Self-pay | Admitting: Family

## 2018-01-12 VITALS — BP 116/78 | HR 70 | Temp 98.1°F | Resp 15 | Ht 69.0 in | Wt 135.4 lb

## 2018-01-12 DIAGNOSIS — R5383 Other fatigue: Secondary | ICD-10-CM

## 2018-01-12 DIAGNOSIS — Z136 Encounter for screening for cardiovascular disorders: Secondary | ICD-10-CM

## 2018-01-12 DIAGNOSIS — M19019 Primary osteoarthritis, unspecified shoulder: Secondary | ICD-10-CM

## 2018-01-12 LAB — CBC WITH DIFFERENTIAL/PLATELET
Basophils Absolute: 0.1 10*3/uL (ref 0.0–0.1)
Basophils Relative: 1.4 % (ref 0.0–3.0)
Eosinophils Absolute: 0.1 10*3/uL (ref 0.0–0.7)
Eosinophils Relative: 1.8 % (ref 0.0–5.0)
HCT: 40.2 % (ref 36.0–46.0)
Hemoglobin: 13.6 g/dL (ref 12.0–15.0)
Lymphocytes Relative: 30.6 % (ref 12.0–46.0)
Lymphs Abs: 1.3 10*3/uL (ref 0.7–4.0)
MCHC: 33.7 g/dL (ref 30.0–36.0)
MCV: 88.3 fl (ref 78.0–100.0)
Monocytes Absolute: 0.4 10*3/uL (ref 0.1–1.0)
Monocytes Relative: 8.6 % (ref 3.0–12.0)
Neutro Abs: 2.4 10*3/uL (ref 1.4–7.7)
Neutrophils Relative %: 57.6 % (ref 43.0–77.0)
Platelets: 265 10*3/uL (ref 150.0–400.0)
RBC: 4.56 Mil/uL (ref 3.87–5.11)
RDW: 13.3 % (ref 11.5–15.5)
WBC: 4.1 10*3/uL (ref 4.0–10.5)

## 2018-01-12 LAB — LIPID PANEL
Cholesterol: 172 mg/dL (ref 0–200)
HDL: 56.2 mg/dL (ref >39.00)
LDL Cholesterol: 95 mg/dL (ref 0–99)
NonHDL: 115.79
Total CHOL/HDL Ratio: 3
Triglycerides: 103 mg/dL (ref 0.0–149.0)
VLDL: 20.6 mg/dL (ref 0.0–40.0)

## 2018-01-12 LAB — TSH: TSH: 1.57 u[IU]/mL (ref 0.35–4.50)

## 2018-01-12 LAB — IBC PANEL
Iron: 102 ug/dL (ref 42–145)
Saturation Ratios: 23.6 % (ref 20.0–50.0)
Transferrin: 309 mg/dL (ref 212.0–360.0)

## 2018-01-12 LAB — COMPREHENSIVE METABOLIC PANEL
ALT: 9 U/L (ref 0–35)
AST: 15 U/L (ref 0–37)
Albumin: 4 g/dL (ref 3.5–5.2)
Alkaline Phosphatase: 46 U/L (ref 39–117)
BUN: 16 mg/dL (ref 6–23)
CO2: 29 mEq/L (ref 19–32)
Calcium: 9.8 mg/dL (ref 8.4–10.5)
Chloride: 104 mEq/L (ref 96–112)
Creatinine, Ser: 0.71 mg/dL (ref 0.40–1.20)
GFR: 86.07 mL/min (ref >60.00)
Glucose, Bld: 76 mg/dL (ref 70–99)
Potassium: 4.5 mEq/L (ref 3.5–5.1)
Sodium: 139 mEq/L (ref 135–145)
Total Bilirubin: 0.5 mg/dL (ref 0.2–1.2)
Total Protein: 6.6 g/dL (ref 6.0–8.3)

## 2018-01-12 LAB — HEMOGLOBIN A1C: Hgb A1c MFr Bld: 5 % (ref 4.6–6.5)

## 2018-01-12 LAB — VITAMIN D 25 HYDROXY (VIT D DEFICIENCY, FRACTURES): VITD: 35.25 ng/mL (ref 30.00–100.00)

## 2018-01-12 NOTE — Patient Instructions (Addendum)
Due for prevnar as discussed  Labs today  If your enlarged [facility] joint becomes any way bothersome, larger, let me know and we will consult orthopedics  Pleasure seeing you

## 2018-01-12 NOTE — Assessment & Plan Note (Signed)
Pending lipid panel 

## 2018-01-12 NOTE — Progress Notes (Signed)
Subjective:    Patient ID: Julie Rivera, female    DOB: February 09, 1946, 72 y.o.   MRN: [id number]  CC: Julie Rivera is a 72 y.o. female who presents today for follow up.   HPI: Feels well today.  Here for checkup .  She does endorse that she has felt fatigued for years, "thinks I'm just getting old". She would like lab work done regarding this.  She would like to be checked for iron deficiency.     No trouble sleeping, states she sleeps about 9 hours a night.   Regular exercise,  tennis.  No chest pain, syncopal, palpitations, shortness of breath.    She is not fatigued throughout the day.  No depression.   Right clavicular mass which she noticed about 5 months ago, not changed in size .  [facility] she had an x-ray, subsequent ultrasound.  She was referred to Dr. Genevive Bi for further evaluation.  Able to play tennis without pain. Full range of motion. Told it was arthritis per Dr Genevive Bi.  Plans to follow-up in a year for repeat CT chest to ensure stability of lung nodule.  Due for mammogram- she states she follows with GYN for this.   Dr Genevive Bi- 10/2017 CT chest and CT soft tissue neck 11/10/17 ; advised ct chest 6 months FNA Repeat ct chest 2020- f/u Oaks  HISTORY:  Past Medical History:  Diagnosis Date  . Allergy   . Chicken pox   . Kidney stones    Past Surgical History:  Procedure Laterality Date  . BACK SURGERY  2017  . KNEE ARTHROCENTESIS    . SHOULDER SURGERY     Family History  Problem Relation Age of Onset  . Hypertension Sister   . Sudden death Neg Hx   . Hyperlipidemia Neg Hx   . Heart attack Neg Hx   . Diabetes Neg Hx   . Ovarian cancer Neg Hx   . Breast cancer Neg Hx     Allergies: Patient has no known allergies. Current Outpatient Medications on File Prior to Visit  Medication Sig Dispense Refill  . PREMPHASE 0.625-5 MG TABS tablet TAKE 1 TABLET DAILY 28 tablet 0   No current facility-administered medications on  file prior to visit.     Social History   Tobacco Use  . Smoking status: Never Smoker  . Smokeless tobacco: Never Used  Substance Use Topics  . Alcohol use: No  . Drug use: No    Review of Systems  Constitutional: Positive for fatigue. Negative for appetite change, chills, fever and unexpected weight change.  Respiratory: Negative for cough.   Cardiovascular: Negative for chest pain and palpitations.  Gastrointestinal: Negative for nausea and vomiting.  Hematological: Negative for adenopathy.      Objective:    BP 116/78 (BP Location: Left Arm, Patient Position: Sitting, Cuff Size: Normal)   Pulse 70   Temp 98.1 F (36.7 C) (Oral)   Resp 15   Ht 5\' 9"  (1.753 m)   Wt 135 lb 6 oz (61.4 kg)   LMP 02/07/2016   SpO2 98%   BMI 19.99 kg/m  BP Readings from Last 3 Encounters:  01/12/18 116/78  11/11/17 110/68  10/24/17 139/83   Wt Readings from Last 3 Encounters:  01/12/18 135 lb 6 oz (61.4 kg)  11/11/17 135 lb 1.9 oz (61.3 kg)  10/24/17 134 lb 3.2 oz (60.9 kg)    Physical Exam  Constitutional:  She appears well-developed and well-nourished.  Eyes: Conjunctivae are normal.  Neck:    Nontender, discrete mass noted right sternoclavicular joint.  Cardiovascular: Normal rate, regular rhythm, normal heart sounds and normal pulses.  Pulmonary/Chest: Effort normal and breath sounds normal. She has no wheezes. She has no rhonchi. She has no rales.  Neurological: She is alert.  Skin: Skin is warm and dry.  Psychiatric: She has a normal mood and affect. Her speech is normal and behavior is normal. Thought content normal.  Vitals reviewed.      Assessment & Plan:   Problem List Items Addressed This Visit      Musculoskeletal and Integument   AC joint arthropathy - Primary    Unchanged.  Reviewed CT chest, CT soft tissue neck with patient today.  She will continue to follow Dr. Genevive Bi to  repeat CT chest 2/20.  At this time, her enlarged [place] joint is not bothersome for her,  and is not limiting her ability to exercise.  She politely declined seeing orthopedic today since it is not bothersome which I think appropriate after her evaluation with Dr Genevive Bi.  I advised her if it did change in size, or became bothersome, to let me know we could consult orthopedics/rheumatology. She will let me know      Relevant Orders   CBC with Differential/Platelet     Other   Screening for cardiovascular condition    Pending lipid panel      Relevant Orders   Hemoglobin A1c   Lipid panel   Other fatigue    Appears chronic.  Discussed with patient no known etiology at this time.  She appears to get a good amount exercise, sleep.  Will look for metabolic reasons with labs today.  If no etiologies seen in lab work, advised patient is a very vigilant regarding this symptom any new symptoms.  She will let me know if the symptoms persist so we can see further work-up.      Relevant Orders   IBC panel   CBC with Differential/Platelet   Comprehensive metabolic panel   Hemoglobin A1c   Lipid panel   VITAMIN D 25 Hydroxy (Vit-D Deficiency, Fractures)   TSH       I am having Julie Rivera. Julie "Vee" maintain her PREMPHASE.   No orders of the defined types were placed in this encounter.   Return precautions given.   Risks, benefits, and alternatives of the medications and treatment plan prescribed today were discussed, and patient expressed understanding.   Education regarding symptom management and diagnosis given to patient on AVS.  Continue to follow with Julie Hawthorne, FNP for routine health maintenance.   Julie Rivera and I agreed with plan.   Julie Paris, FNP

## 2018-01-12 NOTE — Assessment & Plan Note (Signed)
Unchanged.  Reviewed CT chest, CT soft tissue neck with patient today.  She will continue to follow Dr. Genevive Bi to  repeat CT chest 2/20.  At this time, her enlarged [place] joint is not bothersome for her, and is not limiting her ability to exercise.  She politely declined seeing orthopedic today since it is not bothersome which I think appropriate after her evaluation with Dr Genevive Bi.  I advised her if it did change in size, or became bothersome, to let me know we could consult orthopedics/rheumatology. She will let me know

## 2018-01-12 NOTE — Assessment & Plan Note (Signed)
Appears chronic.  Discussed with patient no known etiology at this time.  She appears to get a good amount exercise, sleep.  Will look for metabolic reasons with labs today.  If no etiologies seen in lab work, advised patient is a very vigilant regarding this symptom any new symptoms.  She will let me know if the symptoms persist so we can see further work-up.

## 2018-01-16 ENCOUNTER — Encounter: Payer: Self-pay | Admitting: Family

## 2018-03-10 DIAGNOSIS — N812 Incomplete uterovaginal prolapse: Secondary | ICD-10-CM | POA: Diagnosis not present

## 2018-03-10 DIAGNOSIS — N8111 Cystocele, midline: Secondary | ICD-10-CM | POA: Diagnosis not present

## 2018-03-17 DIAGNOSIS — N8111 Cystocele, midline: Secondary | ICD-10-CM | POA: Diagnosis not present

## 2018-03-17 DIAGNOSIS — N950 Postmenopausal bleeding: Secondary | ICD-10-CM | POA: Diagnosis not present

## 2018-03-24 DIAGNOSIS — N8111 Cystocele, midline: Secondary | ICD-10-CM | POA: Diagnosis not present

## 2018-03-24 DIAGNOSIS — N812 Incomplete uterovaginal prolapse: Secondary | ICD-10-CM | POA: Diagnosis not present

## 2018-04-13 ENCOUNTER — Other Ambulatory Visit: Payer: Self-pay

## 2018-04-13 DIAGNOSIS — R918 Other nonspecific abnormal finding of lung field: Secondary | ICD-10-CM

## 2018-05-11 ENCOUNTER — Encounter: Payer: Self-pay | Admitting: Family

## 2018-05-11 DIAGNOSIS — Z1231 Encounter for screening mammogram for malignant neoplasm of breast: Secondary | ICD-10-CM | POA: Diagnosis not present

## 2018-05-14 DIAGNOSIS — M25561 Pain in right knee: Secondary | ICD-10-CM | POA: Diagnosis not present

## 2018-05-14 DIAGNOSIS — G8929 Other chronic pain: Secondary | ICD-10-CM | POA: Diagnosis not present

## 2018-05-14 DIAGNOSIS — M25461 Effusion, right knee: Secondary | ICD-10-CM | POA: Diagnosis not present

## 2018-05-14 DIAGNOSIS — M1711 Unilateral primary osteoarthritis, right knee: Secondary | ICD-10-CM | POA: Diagnosis not present

## 2018-05-29 ENCOUNTER — Ambulatory Visit
Admission: RE | Admit: 2018-05-29 | Discharge: 2018-05-29 | Disposition: A | Discharge status: Home / Self Care | Payer: Medicare Other | Source: Ambulatory Visit | Attending: Cardiothoracic Surgery | Admitting: Cardiothoracic Surgery

## 2018-05-29 ENCOUNTER — Ambulatory Visit: Payer: Medicare Other | Admitting: Cardiothoracic Surgery

## 2018-05-29 DIAGNOSIS — R918 Other nonspecific abnormal finding of lung field: Secondary | ICD-10-CM | POA: Insufficient documentation

## 2018-06-10 DIAGNOSIS — M1711 Unilateral primary osteoarthritis, right knee: Secondary | ICD-10-CM | POA: Diagnosis not present

## 2018-06-12 ENCOUNTER — Ambulatory Visit: Payer: Medicare Other | Admitting: Cardiothoracic Surgery

## 2018-06-16 DIAGNOSIS — R531 Weakness: Secondary | ICD-10-CM | POA: Diagnosis not present

## 2018-06-16 DIAGNOSIS — M25561 Pain in right knee: Secondary | ICD-10-CM | POA: Diagnosis not present

## 2018-06-18 IMAGING — US US PELVIS COMPLETE
1 series · 14 of 25 positions shown · non-contrast
Comparison: Outside MRI report from 09/04/2016. Images are not
available for direct comparison.

CLINICAL DATA: Right hemipelvis cystic abnormality by outside MRI

EXAM:
TRANSABDOMINAL ULTRASOUND OF PELVIS
TECHNIQUE: Transabdominal ultrasound examination of the pelvis was performed
including evaluation of the uterus, ovaries, adnexal regions, and
pelvic cul-de-sac.

[Series 1: us pelvis complete · 0.23mm/px · 14 of 31 slices shown]
[im 1/31]
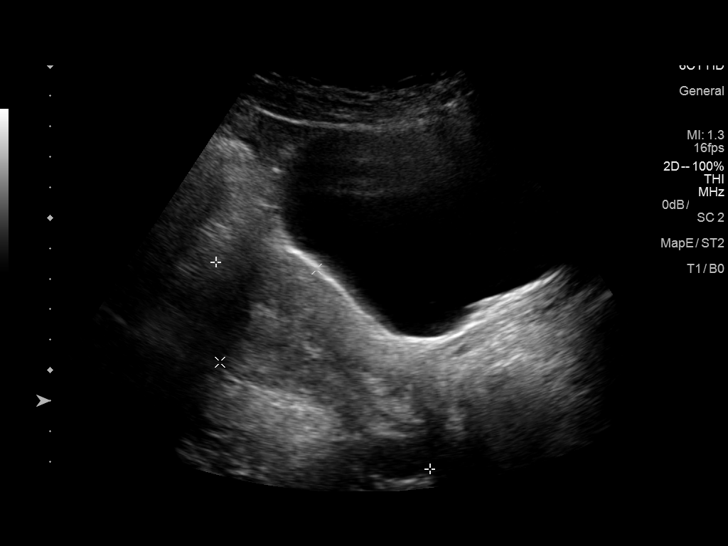
[im 3/31]
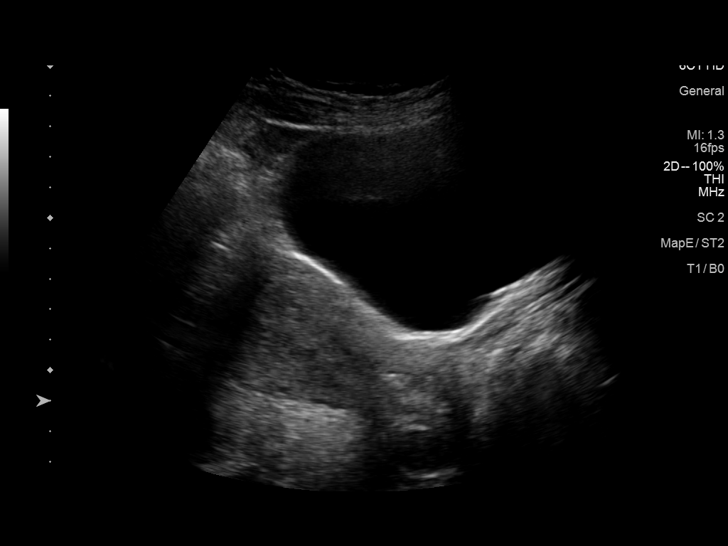
[im 6/31]
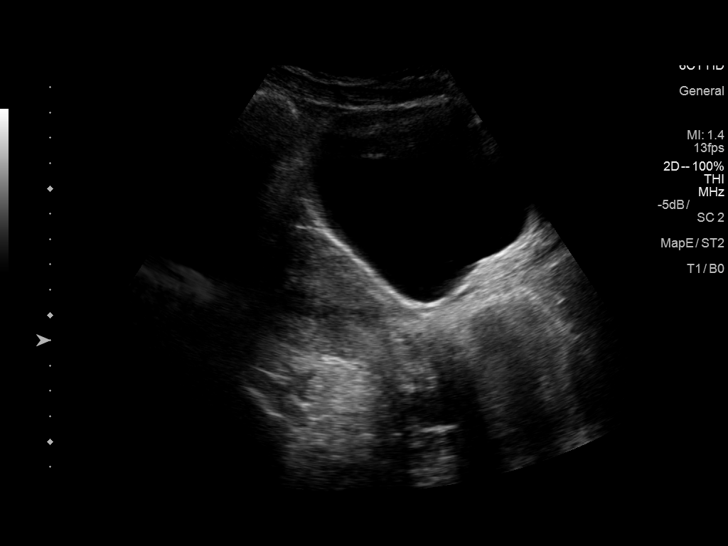
[im 8/31]
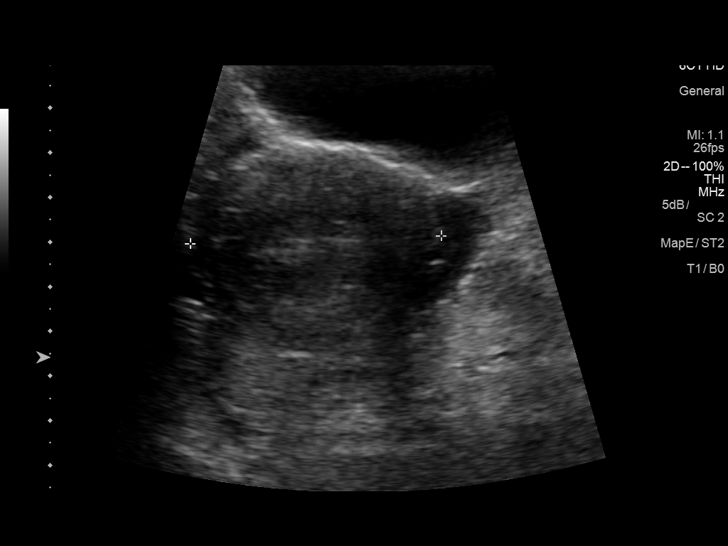
[im 11/31]
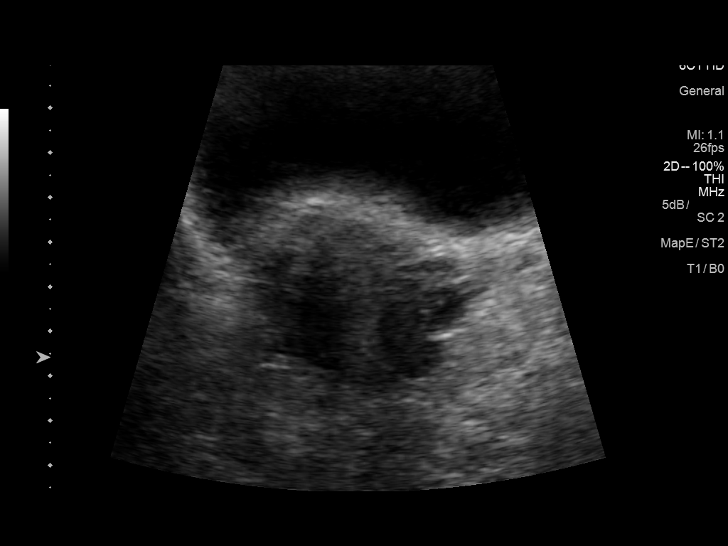
[im 12/31]
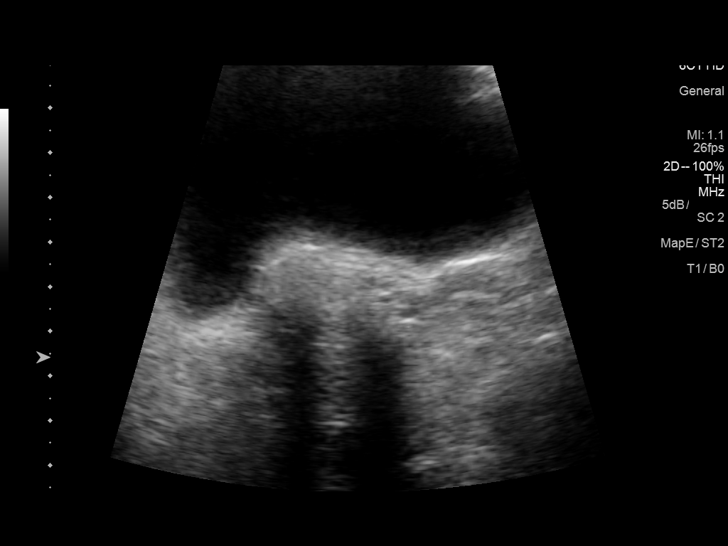
[im 14/31]
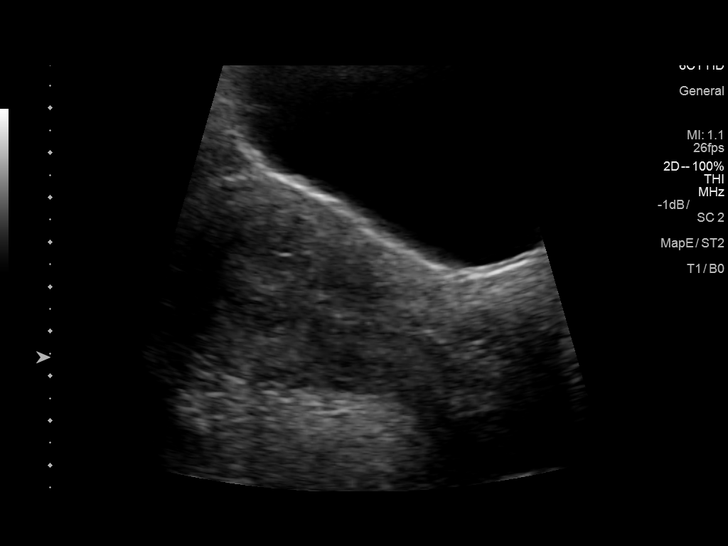
[im 17/31]
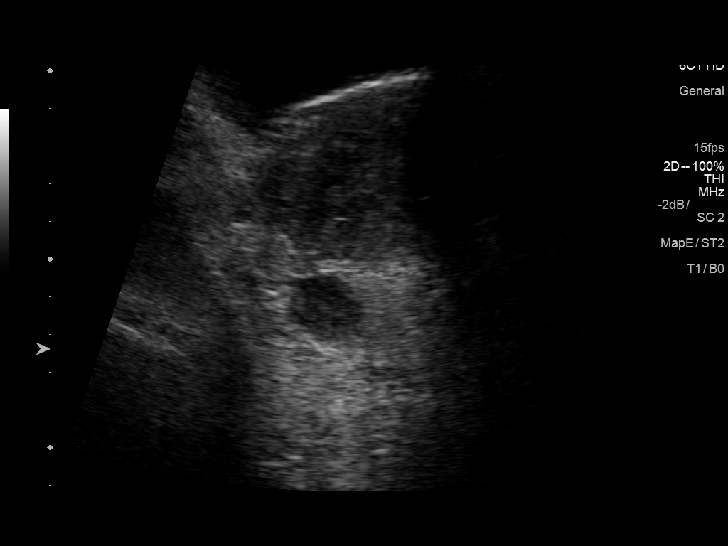
[im 19/31]
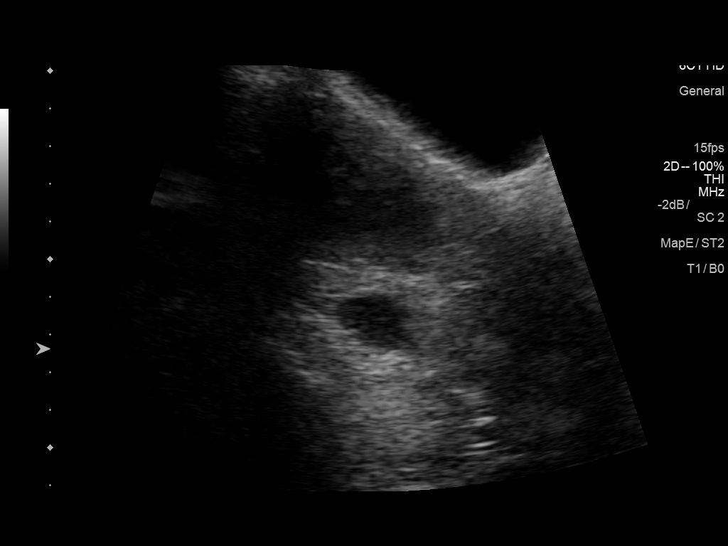
[im 21/31]
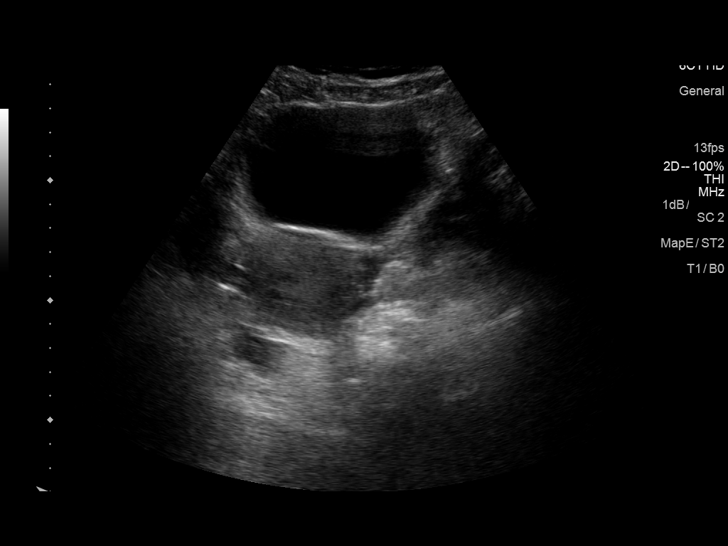
[im 23/31]
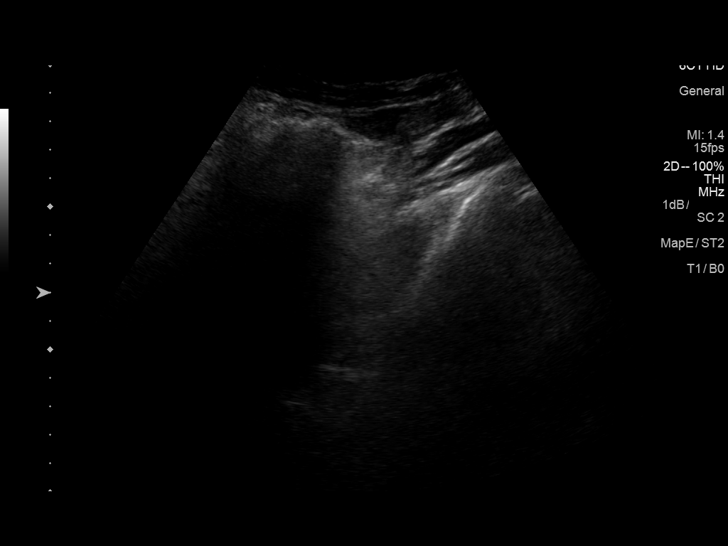
[im 26/31]
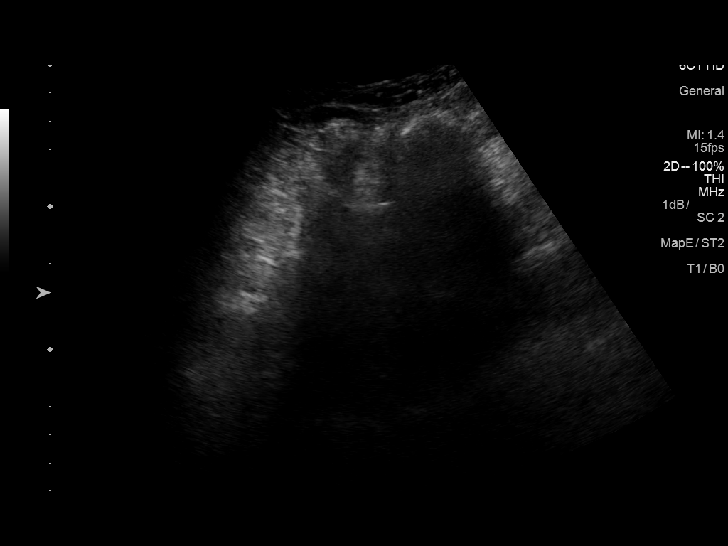
[im 28/31]
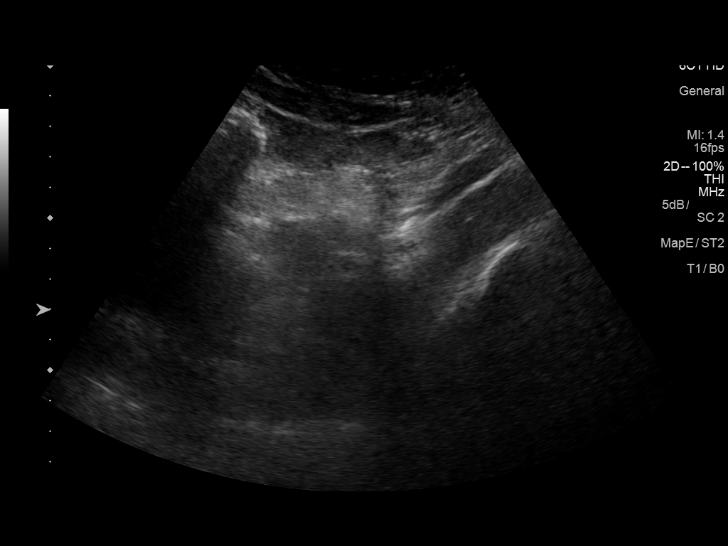
[im 31/31]
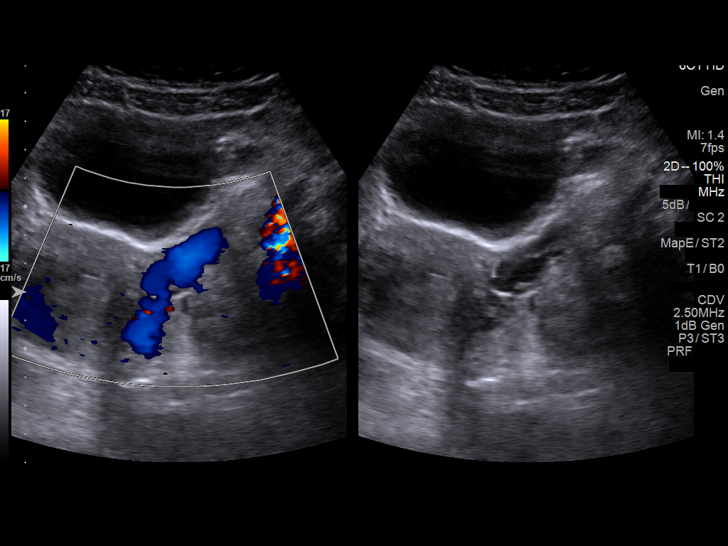

[14 of 25 positions shown; findings below may reference images not displayed]

FINDINGS: Uterus

Measurements: 9.8 x 4.4 x 5.6 cm. No fibroids or other mass
visualized.

Endometrium

Thickness: 6.9 mm.  No focal abnormality visualized.

Right ovary

Measurements: 2.1 x 1.6 x 1.9 cm. Right ovary appears prominent with
a small probable hypoechoic 2 cm cyst. This likely accounts for the
MRI finding.

Left ovary

Not visualized.

Other findings:  No free fluid.

Study is limited. Patient refused transvaginal imaging for better
visualization
IMPRESSION: Probable 2 cm right ovarian cyst. Limited assessment by
transabdominal imaging only. Patient refused transvaginal imaging.

No other significant uterine or endometrial abnormality.

Nonvisualization of left ovary

No free fluid.

## 2018-06-19 DIAGNOSIS — M25561 Pain in right knee: Secondary | ICD-10-CM | POA: Diagnosis not present

## 2018-06-19 DIAGNOSIS — R531 Weakness: Secondary | ICD-10-CM | POA: Diagnosis not present

## 2018-06-22 ENCOUNTER — Encounter: Payer: Self-pay | Admitting: Family

## 2018-06-26 DIAGNOSIS — D485 Neoplasm of uncertain behavior of skin: Secondary | ICD-10-CM | POA: Diagnosis not present

## 2018-06-26 DIAGNOSIS — L718 Other rosacea: Secondary | ICD-10-CM | POA: Diagnosis not present

## 2018-07-01 DIAGNOSIS — N812 Incomplete uterovaginal prolapse: Secondary | ICD-10-CM | POA: Diagnosis not present

## 2018-07-01 DIAGNOSIS — N8111 Cystocele, midline: Secondary | ICD-10-CM | POA: Diagnosis not present

## 2018-07-01 DIAGNOSIS — Z4689 Encounter for fitting and adjustment of other specified devices: Secondary | ICD-10-CM | POA: Diagnosis not present

## 2018-07-13 ENCOUNTER — Telehealth: Payer: Self-pay

## 2018-07-13 DIAGNOSIS — M2550 Pain in unspecified joint: Secondary | ICD-10-CM

## 2018-07-13 NOTE — Telephone Encounter (Signed)
Copied from [place] [phone]. Topic: Referral - Request for Referral >> Jul 13, 2018  9:05 AM [name] wrote: Has patient seen PCP for this complaint? Yes -  *If NO, is insurance requiring patient see PCP for this issue before PCP can refer them? - no but specialist office does require referral Referral for which specialty: rheumatologist Preferred provider/office: [facility]. [facility]  [place], [place] [address]  Office: [phone] Reason for referral: arthritis in collar bone & arthritis in right knee

## 2018-07-15 NOTE — Addendum Note (Signed)
Addended by: Burnard Hawthorne on: 07/15/2018 01:07 PM   Modules accepted: Orders

## 2018-07-15 NOTE — Telephone Encounter (Signed)
I left detailed message stating that referral was placed, but I was unsure if they were seeing patients in office. I offered a Doxy visit with Joycelyn Schmid if pain has worsened.

## 2018-07-15 NOTE — Telephone Encounter (Signed)
Call pt  I saw the patient has been seen by sports medicine earlier this year.  In terms of joint pain, sometimes rheumatology requires labs done in advance to autoimmune disease.  We will go ahead and place referral, if any pushback rheumatology in terms of it being an autoimmune condition versus osteoarthritis which is typically treated by orthopedic,  will call patient.    If she is having worsening  joint pain, please advise WebEx with me.

## 2018-07-20 ENCOUNTER — Encounter: Payer: Self-pay | Admitting: Family

## 2018-07-20 ENCOUNTER — Ambulatory Visit (INDEPENDENT_AMBULATORY_CARE_PROVIDER_SITE_OTHER): Payer: Medicare Other | Admitting: Family

## 2018-07-20 ENCOUNTER — Other Ambulatory Visit: Payer: Self-pay

## 2018-07-20 DIAGNOSIS — E785 Hyperlipidemia, unspecified: Secondary | ICD-10-CM | POA: Diagnosis not present

## 2018-07-20 DIAGNOSIS — M25561 Pain in right knee: Secondary | ICD-10-CM | POA: Diagnosis not present

## 2018-07-20 DIAGNOSIS — Z87898 Personal history of other specified conditions: Secondary | ICD-10-CM

## 2018-07-20 DIAGNOSIS — R5383 Other fatigue: Secondary | ICD-10-CM

## 2018-07-20 MED ORDER — MELOXICAM 7.5 MG PO TABS: 7.5000 mg | ORAL_TABLET | Freq: Every day | ORAL | 3 refills | Status: DC

## 2018-07-20 NOTE — Progress Notes (Addendum)
This visit type was conducted due to national recommendations for restrictions regarding the COVID-19 pandemic (e.g. social distancing).  This format is felt to be most appropriate for this patient at this time.  All issues noted in this document were discussed and addressed.  No physical exam was performed (except for noted visual exam findings with Video Visits). Virtual Visit via Video Note  I connected with@  on 07/20/18 at 11:30 AM EDT by a video enabled telemedicine application and verified that I am speaking with the correct person using two identifiers.  Location patient: home Location provider:work  Persons participating in the virtual visit: patient, provider  I discussed the limitations of evaluation and management by telemedicine and the availability of in person appointments. The patient expressed understanding and agreed to proceed. Interactive audio and video telecommunications were attempted between this provider and patient, however failed, due to patient having technical difficulties or patient did not have access to video capability.  I was able to see patient initially and she could see me as well however no audio.  Note limited exam below.  We continued and completed visit with audio only.    HPI: Right knee pain  X approx 3 months, worsening.   Started after tennis after 'hard 2 hour game.' Did not hear popping or know of actual injury. Woke up the following day with pain in right knee. No giving way in knee. Painful with standing on right knee alone, however if standing with both feet on the ground, weight , she doesn't have pain. Painful to walk for when puts weight on right knee.  No hip pain, numbness in legs. No fever, N, V. Otherwise feels well.  Pain has worsened even with rest from tennis.Swelling on medial side of right knee. No calf swelling, SOB. Icing without much relief. Has been taking tumeric. Not on NSAIDs.  Tried a neoprene sleeve without relief. Using band  under knee cap when plays tennis. Has been using voltaren gel in the morning and feels right knee is warm only when applying gel. Feels knees while on the phone and states they feel similar, no increased warmth. No redness.    Has been seeing [facility] Dr Marlou Starks 06/2018 ; had pain and no swelling at that time.  Xray done which showed OA. Advised to order MRI knee if PT doesn't improve. PT canceled due to pandemic.  Also seen by [facility] 05/14/2018 and had cortizone injection.  however cannot see her until end of July therefore would like to see Dr Barb Merino.  Referral to Dr Barb Merino- Appointment in May.   She is no longer worried about fatigue.  States that she "feels well"   labs 01/2018. Normal iron, thyroid HLD- declines cholesterol medication  Right clavicular mass- per patient, no further follow up.  Has seen Dr Genevive Bi. Repeat CT chest 05/2018.  Stable.   Patient states no metal in her body  ROS: See pertinent positives and negatives per HPI.  Past Medical History:  Diagnosis Date  . Allergy   . Chicken pox   . Kidney stones     Past Surgical History:  Procedure Laterality Date  . BACK SURGERY  2017  . KNEE ARTHROCENTESIS    . SHOULDER SURGERY      Family History  Problem Relation Age of Onset  . Hypertension Sister   . Sudden death Neg Hx   . Hyperlipidemia Neg Hx   . Heart attack Neg Hx   . Diabetes Neg Hx   .  Ovarian cancer Neg Hx   . Breast cancer Neg Hx     SOCIAL HX: never smoker   Current Outpatient Medications:  .  meloxicam (MOBIC) 7.5 MG tablet, Take 1 tablet (7.5 mg total) by mouth daily., Disp: 30 tablet, Rfl: 3 .  PREMPHASE 0.625-5 MG TABS tablet, TAKE 1 TABLET DAILY, Disp: 28 tablet, Rfl: 0  EXAM:  VITALS per patient if applicable:    PSYCH/NEURO: pleasant and cooperative, no obvious depression or anxiety, speech and thought processing grossly intact  ASSESSMENT AND PLAN:  Discussed the following assessment and plan:  Acute pain of right  knee - Plan: meloxicam (MOBIC) 7.5 MG tablet, MR Knee Right Wo Contrast  Other fatigue  Hyperlipidemia, unspecified hyperlipidemia type    Problem List Items Addressed This Visit      Other   Other fatigue    States she feels well, no longer concerned with fatigue.  Advised patient to let me know certainly if this were to change.      Acute pain of right knee - Primary    Acute on chronic.  Over phone call, I am unable to assess patient.  Certainly I discussed with her differentials including gout, septic arthritis, acute on chronic meniscal injury as well as osteoarthritis.  This may be progression of osteoarthritis and/or a meniscal in injury as in  particular symptoms on the medial side of the knee however I am concerned as I am unable to physically assess patient.  At this time, my nurse will be calling Dr. Ethlyn Gallery office who is seen her twice for this issue to see if we can do an urgent appointment with them.  I ordered urgent MRI.  Patient politely declines any lab work at this time (CBC, uric acid, sed rate. )  Advised very close vigilance and patient let me know if she appreciates warmth in the knee, certainly erythema, increased swelling or pain.  We will do a short trial of meloxicam.  Advised take with food and for short term.  We will also work to see if we get patient seen earlier by Dr. Barb Merino as she already has an appointment scheduled in 1 month.      Relevant Medications   meloxicam (MOBIC) 7.5 MG tablet   Other Relevant Orders   MR Knee Right Wo Contrast   HLD (hyperlipidemia)    Declines medication treatment at this time           I discussed the assessment and treatment plan with the patient. The patient was provided an opportunity to ask questions and all were answered. The patient agreed with the plan and demonstrated an understanding of the instructions.   The patient was advised to call back or seek an in-person evaluation if the symptoms worsen or if the  condition fails to improve as anticipated.   Mable Paris, FNP   I spent 25 min non face to face w/ pt. Note crutches ( no. 7703)given 07/24/18.

## 2018-07-20 NOTE — Patient Instructions (Addendum)
May hold tumeric.   Trial Mobic. Take with food.

## 2018-07-20 NOTE — Assessment & Plan Note (Signed)
States she feels well, no longer concerned with fatigue.  Advised patient to let me know certainly if this were to change.

## 2018-07-20 NOTE — Progress Notes (Signed)
lmtcb to either me or margaret Arnette. I left a detailed message stating that the patient needs a urgent care appt for her right knee swelling to rule out septis, pt has a MRI scheduled for 4/14/ on tht right knee.

## 2018-07-20 NOTE — Assessment & Plan Note (Signed)
Declines medication treatment at this time

## 2018-07-20 NOTE — Assessment & Plan Note (Addendum)
Acute on chronic.  Over phone call, I am unable to assess patient.  Certainly I discussed with her differentials including gout, septic arthritis, acute on chronic meniscal injury as well as osteoarthritis.  This may be progression of osteoarthritis and/or a meniscal in injury as in  particular symptoms on the medial side of the knee however I am concerned as I am unable to physically assess patient.  At this time, my nurse will be calling Dr. Ethlyn Gallery office who is seen her twice for this issue to see if we can do an urgent appointment with them.  I ordered urgent MRI.  Patient politely declines any lab work at this time (CBC, uric acid, sed rate. )  Advised very close vigilance and patient let me know if she appreciates warmth in the knee, certainly erythema, increased swelling or pain.  We will do a short trial of meloxicam.  Advised take with food and for short term.  We will also work to see if we get patient seen earlier by Dr. Barb Merino as she already has an appointment scheduled in 1 month.

## 2018-07-21 ENCOUNTER — Telehealth: Payer: Self-pay | Admitting: Family

## 2018-07-21 ENCOUNTER — Other Ambulatory Visit: Payer: Self-pay

## 2018-07-21 ENCOUNTER — Ambulatory Visit
Admission: RE | Admit: 2018-07-21 | Discharge: 2018-07-21 | Disposition: A | Discharge status: Home / Self Care | Payer: Medicare Other | Source: Ambulatory Visit | Attending: Family | Admitting: Family

## 2018-07-21 DIAGNOSIS — M25561 Pain in right knee: Secondary | ICD-10-CM | POA: Diagnosis not present

## 2018-07-21 DIAGNOSIS — S83241A Other tear of medial meniscus, current injury, right knee, initial encounter: Secondary | ICD-10-CM | POA: Diagnosis not present

## 2018-07-21 NOTE — Telephone Encounter (Signed)
Julie Rivera from Dr. Meda Coffee, Rheumatologist called stating she was returning a call to [facility].  Request call back at [phone].

## 2018-07-21 NOTE — Telephone Encounter (Signed)
Patient called and left a message stating that she needs a call back to discuss with her why she is not getting a stronger dosage of meloxicam. She states her "friend has the same issue and the dosage they received was over double that strong." She feels she will need the stronger dosage as well. Please call patient at [phone] (mobile)

## 2018-07-21 NOTE — Progress Notes (Signed)
I called the office of Dr. Barb Merino and spoke with the nurse there and informed her of the things that pertained to the pt concerning her right knee, The nurse documented everything including your cell phone number, to reach out if needed, I informed her of her MRI today as well,  she stated that she would give the message to Dr. Barb Merino and see what she would like to do because they are limited on seeing patients at this time, but she is seeing urgent care patients, I also called [facility] and left a voicemail for the nurse administrator Dr. Marlou Starks to return a call back to me, I did leave the patients information on the voicemail and your information. Hopefully they will  Reach out to one of [place] about this patients knee concerns.

## 2018-07-22 ENCOUNTER — Telehealth: Payer: Self-pay

## 2018-07-22 ENCOUNTER — Telehealth: Payer: Self-pay | Admitting: Family

## 2018-07-22 DIAGNOSIS — S83241A Other tear of medial meniscus, current injury, right knee, initial encounter: Secondary | ICD-10-CM | POA: Diagnosis not present

## 2018-07-22 DIAGNOSIS — M25561 Pain in right knee: Secondary | ICD-10-CM | POA: Diagnosis not present

## 2018-07-22 DIAGNOSIS — S83203D Other tear of unspecified meniscus, current injury, right knee, subsequent encounter: Secondary | ICD-10-CM | POA: Diagnosis not present

## 2018-07-22 NOTE — Telephone Encounter (Signed)
I spoke with patent see result note 07/22/18.

## 2018-07-22 NOTE — Telephone Encounter (Signed)
I spoke with Julie Rivera from [facility] & patient is going into the walk-in clinic in [place] today before 7:30. Walk-in clinics are open & seeing patients ONLY AFTER BEING SCREENED.

## 2018-07-22 NOTE — Telephone Encounter (Signed)
Please see result note 07/20/18. Patient not agreeable to try to see [facility]. I have called & since she has not been seen there before I had to fax her notes/records to [place]. I have faxed & will follow-up to make sure that she received this fax. I also included note that we feel patient needs to be seen ASAP due to swelling & some heat to her knee.

## 2018-07-22 NOTE — Telephone Encounter (Signed)
Please call [facility], Dr. Marlou Starks.  You can find information in patients recent office visits.  She saw her in March.  I would like you to call our office and ask again her nurse to give [place] a call today.  I also like for you make aware that we did an MRI right knee with the following results:   IMPRESSION: 1. Subchondral insufficiency fracture of the medial femoral condyle. 2. Focal complex tear of the medial meniscus anterior horn/body junction. 3. Tricompartmental cartilage thinning without focal defect. 4. Minimal MCL and pes anserine bursitis.  patient is having increased pain and swelling.  Is her office seeing any patients at all?  I also like for you to call rheumatology, Dr. Barb Merino at [facility] as pt has  an appointment to see her in May.  They have any earlier appointments?Marland Kitchen

## 2018-07-22 NOTE — Telephone Encounter (Signed)
noted 

## 2018-07-22 NOTE — Telephone Encounter (Signed)
I called [facility] & asked for Julie Rivera. I was sent to her VM & left a detailed message asking her to please call back to f/u on fax that I sent.

## 2018-07-22 NOTE — Progress Notes (Signed)
I received a call from Dr. Ethlyn Gallery office about the pt and the nurse states that the pt would need to go to the ER if you really think her knee is septus, he stated the provider triaged all his pt's and the June appt is where they placed her because she is an arthritis patient and he is not seeing any patients at this time. He said the appt in June is for her knee replacement.   Larenda Reedy,cma

## 2018-07-22 NOTE — Telephone Encounter (Signed)
Copied from Julie Rivera [phone]. Topic: General - Other >> Jul 22, 2018  3:44 PM Oneta Rack wrote: Relation to pt: self  Call back number: [phone]   Reason for call:  Patient requesting referral to [facility] for right knee, specialist advised patient referral expired, please advise patient when referral is placed.

## 2018-07-23 ENCOUNTER — Telehealth: Payer: Self-pay

## 2018-07-23 NOTE — Telephone Encounter (Signed)
I mailed and faxed the MRI results to Dr. Ethlyn Gallery office per the patients request.  Lesia Hausen

## 2018-07-23 NOTE — Telephone Encounter (Signed)
Copied from [place] [phone]. Topic: General - Other >> Jul 23, 2018  8:12 AM Keene Breath wrote: Reason for CRM: Patient called to request that the nurse or doctor call her back regarding her recent MRI.   Patient stated that she needs the MRI results sent to her doctor at [facility] and she has the instructions on how it should be sent.  Please call patient back as soon as possible at [phone]

## 2018-07-23 NOTE — Telephone Encounter (Signed)
Copied from [place] [phone]. Topic: General - Other >> Jul 23, 2018  8:12 AM Keene Breath wrote: Reason for CRM: Patient called to request that the nurse or doctor call her back regarding her recent MRI.   Patient stated that she needs the MRI results sent to her doctor at [facility] and she has the instructions on how it should be sent.  Please call patient back as soon as possible at [phone] >> Jul 23, 2018  1:32 PM Alanda Slim E wrote: It should go to [employer] (electronic way to send it) or if it needs to be mailed, it needs to go to  Dr. Ricardo Jericho at  [address], [address]  [place] [address]

## 2018-07-24 ENCOUNTER — Other Ambulatory Visit: Payer: Self-pay | Admitting: Family

## 2018-07-24 DIAGNOSIS — M25561 Pain in right knee: Secondary | ICD-10-CM

## 2018-07-24 NOTE — Telephone Encounter (Signed)
Julie Rivera,  Ensure you address the following:   Call patient Please inform her that I have made referral to physical therapy as she requested.  We have not received a note from [facility].  Please ask what the status is?  I presume that she was seen.  Will she continue to follow with [facility]? Is she just doing PT or any surgery required?  Wanted to update Dr. Sharyn Dross office, I did get a message from them and it seemed that they preferred the patient would continue to follow with [facility] or her orthopedic at [facility].    However I certainly wanted patients opinion in regards to keeping her appointment with rheumatology in May.  If she needs crutches, she can have some from [place]. Ask [name] what the cost for this is.

## 2018-07-24 NOTE — Telephone Encounter (Signed)
Call pt  I placed referral to Dr. Marry Guan although not sure if she would require. We will certainly try to move this forward from our end however asked patient to call his office as well.  Certainly keep [place] posted  Melissa, how soon could [facility] with ortho see patient?

## 2018-07-24 NOTE — Telephone Encounter (Signed)
Call patient Please inform her that I have made referral to physical therapy as she requested.  We have not received a note from [facility].  Please ask what the status is?  I presume that she was seen.  Will she continue to follow with [facility]? Is she just doing PT or any surgery required?  Wanted to update Dr. Izola Price office, I did get a message from them and it seemed that they preferred the patient would continue to follow with [facility] or her orthopedic at [facility].    However I certainly wanted patients opinion in regards to keeping her appointment with rheumatology in May.

## 2018-07-24 NOTE — Telephone Encounter (Signed)
LMTCB

## 2018-07-24 NOTE — Progress Notes (Signed)
Patient saw [facility] 07/22/18.

## 2018-07-24 NOTE — Addendum Note (Signed)
Addended by: Burnard Hawthorne on: 07/24/2018 01:03 PM   Modules accepted: Orders

## 2018-07-24 NOTE — Addendum Note (Signed)
Addended by: Burnard Hawthorne on: 07/24/2018 08:39 AM   Modules accepted: Orders

## 2018-07-24 NOTE — Telephone Encounter (Signed)
Pt states she did got to [facility] and she will not continue with therapy because of her fracture, She said surgery is required and she wants it done by Dr. Marry Guan here at [facility].  She is keeping the appt with rheumatology at present but she has not decided on whether she needs it or not but will not cancel yet.  She will come in today and pick up her crutches I told her to ask for me.  Gae Bon.,cma

## 2018-07-28 DIAGNOSIS — G8929 Other chronic pain: Secondary | ICD-10-CM | POA: Diagnosis not present

## 2018-07-28 DIAGNOSIS — M25561 Pain in right knee: Secondary | ICD-10-CM | POA: Diagnosis not present

## 2018-07-29 NOTE — Telephone Encounter (Signed)
Does pt have appt with [facility] yet?  Thank you1

## 2018-07-29 NOTE — Telephone Encounter (Signed)
No appt yet, it is being reviewed @ [facility].

## 2018-08-03 NOTE — Telephone Encounter (Signed)
Any updates? 

## 2018-08-05 NOTE — Telephone Encounter (Signed)
noted 

## 2018-08-05 NOTE — Telephone Encounter (Signed)
She was seen on 4/21. Notes from Dr. Marry Guan in your box. Julie Rivera

## 2018-08-05 NOTE — Telephone Encounter (Signed)
Call pt  Just checking on her; understand she saw Dr Marry Guan 07/28/18. Awaiting on notes from consult  Wanted to ensure patient was doing well.

## 2018-10-09 ENCOUNTER — Other Ambulatory Visit: Payer: Self-pay | Admitting: Family

## 2018-10-09 DIAGNOSIS — M25561 Pain in right knee: Secondary | ICD-10-CM

## 2018-11-05 ENCOUNTER — Other Ambulatory Visit: Payer: Self-pay | Admitting: Family

## 2018-11-05 DIAGNOSIS — M25561 Pain in right knee: Secondary | ICD-10-CM

## 2018-12-24 ENCOUNTER — Ambulatory Visit: Payer: Medicare Other

## 2018-12-25 ENCOUNTER — Other Ambulatory Visit: Payer: Self-pay

## 2018-12-25 ENCOUNTER — Ambulatory Visit: Payer: Medicare Other | Admitting: Family

## 2018-12-25 ENCOUNTER — Ambulatory Visit (INDEPENDENT_AMBULATORY_CARE_PROVIDER_SITE_OTHER): Payer: Medicare Other

## 2018-12-25 DIAGNOSIS — Z0000 Encounter for general adult medical examination without abnormal findings: Secondary | ICD-10-CM

## 2018-12-25 NOTE — Patient Instructions (Addendum)
  Ms. Maslen , Thank you for taking time to come for your Medicare Wellness Visit. I appreciate your ongoing commitment to your health goals. Please review the following plan we discussed and let me know if I can assist you in the future.   These are the goals we discussed: Goals    . DIET - INCREASE WATER INTAKE       This is a list of the screening recommended for you and due dates:  Health Maintenance  Topic Date Due  . Flu Shot  07/07/2019*  . Pneumonia vaccines (2 of 2 - PCV13) 12/25/2019*  . Mammogram  05/11/2020  . Colon Cancer Screening  04/08/2024  . Tetanus Vaccine  07/06/2026  . DEXA scan (bone density measurement)  Discontinued  .  Hepatitis C: One time screening is recommended by Center for Disease Control  (CDC) for  adults born from [birth year] through 1964.   Discontinued  *Topic was postponed. The date shown is not the original due date.

## 2018-12-25 NOTE — Progress Notes (Signed)
Subjective:   Julie Rivera is a 73 y.o. female who presents for Medicare Annual (Subsequent) preventive examination.  Review of Systems:  No ROS.  Medicare Wellness Virtual Visit.  Visual/audio telehealth visit, UTA vital signs.   See social history for additional risk factors.   Cardiac Risk Factors include: advanced age (>20men, >28 women)     Objective:     Vitals: LMP 02/07/2016   There is no height or weight on file to calculate BMI.  Advanced Directives 12/25/2018 11/11/2017  Does Patient Have a Medical Advance Directive? Yes Yes  Type of Paramedic of [place];Living will [place]  Does patient want to make changes to medical advance directive? No - Patient declined No - Patient declined  Copy of [place] in Chart? No - copy requested No - copy requested    Tobacco Social History   Tobacco Use  Smoking Status Never Smoker  Smokeless Tobacco Never Used     Counseling given: Not Answered   Clinical Intake:  Pre-visit preparation completed: Yes        Diabetes: No  How often do you need to have someone help you when you read instructions, pamphlets, or other written materials from your doctor or pharmacy?: 1 - Never  Interpreter Needed?: No     Past Medical History:  Diagnosis Date  . Allergy   . Chicken pox   . Kidney stones    Past Surgical History:  Procedure Laterality Date  . BACK SURGERY  2017  . KNEE ARTHROCENTESIS    . SHOULDER SURGERY     Family History  Problem Relation Age of Onset  . Hypertension Sister   . Sudden death Neg Hx   . Hyperlipidemia Neg Hx   . Heart attack Neg Hx   . Diabetes Neg Hx   . Ovarian cancer Neg Hx   . Breast cancer Neg Hx    Social History   Socioeconomic History  . Marital status: Married    Spouse name: Not on file  . Number of children: Not on file  . Years of education: Not on file  . Highest education level: Not on file   Occupational History  . Not on file  Social Needs  . Financial resource strain: Not hard at all  . Food insecurity    Worry: Never true    Inability: Never true  . Transportation needs    Medical: No    Non-medical: No  Tobacco Use  . Smoking status: Never Smoker  . Smokeless tobacco: Never Used  Substance and Sexual Activity  . Alcohol use: No  . Drug use: No  . Sexual activity: Not on file  Lifestyle  . Physical activity    Days per week: 7 days    Minutes per session: 60 min  . Stress: Not at all  Relationships  . Social Herbalist on phone: Not on file    Gets together: Not on file    Attends religious service: Not on file    Active member of club or organization: Not on file    Attends meetings of clubs or organizations: Not on file    Relationship status: Not on file  Other Topics Concern  . Not on file  Social History Narrative   Production assistant, radio, retired      2 children      4 grandchildren  Outpatient Encounter Medications as of 12/25/2018  Medication Sig  . PREMPHASE 0.625-5 MG TABS tablet TAKE 1 TABLET DAILY  . meloxicam (MOBIC) 7.5 MG tablet Take 1 tablet (7.5 mg total) by mouth daily as needed for pain. (Patient not taking: Reported on 12/25/2018)   No facility-administered encounter medications on file as of 12/25/2018.     Activities of Daily Living In your present state of health, do you have any difficulty performing the following activities: 12/25/2018  Hearing? N  Vision? N  Difficulty concentrating or making decisions? N  Walking or climbing stairs? N  Dressing or bathing? N  Doing errands, shopping? N  Preparing Food and eating ? N  Using the Toilet? N  In the past six months, have you accidently leaked urine? N  Do you have problems with loss of bowel control? N  Managing your Medications? N  Managing your Finances? N  Housekeeping or managing your Housekeeping? N  Some recent data might be hidden     Patient Care Team: Burnard Hawthorne, FNP as PCP - General (Family Medicine)    Assessment:   This is a routine wellness examination for [place].  I connected with patient 12/25/18 at 10:00 AM EDT by an audio enabled telemedicine application and verified that I am speaking with the correct person using two identifiers. Patient stated full name and DOB. Patient gave permission to continue with virtual visit. Patient's location was at home and Nurse's location was at [place] office.   Health Maintenance Due: -Influenza vaccine 2020- discussed; to be completed in season with doctor or local pharmacy.   -PNA - discussed; to be completed with doctor in visit or local pharmacy.  Update all pending maintenance due as appropriate.   See completed HM at the end of note.   Eye: Visual acuity not assessed. Virtual visit.   Dental: Visits every 6 months.    Hearing: Demonstrates normal hearing during visit.  Safety:  Patient feels safe at home- yes Patient does have smoke detectors at home- yes Patient does wear sunscreen or protective clothing when in direct sunlight - yes Patient does wear seat belt when in a moving vehicle - yes Patient drives- yes Adequate lighting in walkways free from debris- yes Grab bars and handrails used as appropriate- yes Ambulates with no assistive device Cell phone on person when ambulating outside of the home- yes  Social: Alcohol intake - no     Smoking history- never   Smokers in home? none Illicit drug use? none  Depression: PHQ 2 &9 complete. See screening below. Denies irritability, anhedonia, sadness/tearfullness.  Stable.   Falls: See screening below.    Medication: Taking as directed and without issues.   Covid-19: Precautions and sickness symptoms discussed. Wears mask, social distancing, hand hygiene as appropriate.   Activities of Daily Living Patient denies needing assistance with: household chores, feeding themselves, getting from  bed to chair, getting to the toilet, bathing/showering, dressing, managing money, or preparing meals.   Memory: Patient is alert. Patient denies difficulty focusing or concentrating. Correctly identified the president of the [place], season and recall. Patient likes to read for brain stimulation.  BMI- discussed the importance of a healthy diet, water intake and the benefits of aerobic exercise.  Educational material provided.  Physical activity-  Plays tennis 2-3 times per week, 60 minutes  Diet: Regular Water: good intake  Advanced Directive: End of life planning; Advance aging; Advanced directives discussed.  Copy of current HCPOA/Living Will requested.  Other Providers Patient Care Team: Burnard Hawthorne, FNP as PCP - General (Family Medicine)   Exercise Activities and Dietary recommendations Current Exercise Habits: Home exercise routine, Type of exercise: calisthenics, Time (Minutes): 60, Frequency (Times/Week): 3, Weekly Exercise (Minutes/Week): 180  Goals    . DIET - INCREASE WATER INTAKE       Fall Risk Fall Risk  12/25/2018 11/11/2017 07/01/2016 03/14/2016  Falls in the past year? 0 No No No   Timed Get Up and Go performed: no, virtual visit  Depression Screen PHQ 2/9 Scores 12/25/2018 11/11/2017 07/01/2016 03/14/2016  PHQ - 2 Score 0 0 0 0     Cognitive Function MMSE - Mini Mental State Exam 11/11/2017  Orientation to time 5  Orientation to Place 5  Registration 3  Attention/ Calculation 5  Recall 3  Language- name 2 objects 2  Language- repeat 1  Language- follow 3 step command 3  Language- read & follow direction 1  Write a sentence 1  Copy design 1  Total score 30     6CIT Screen 12/25/2018  What Year? 0 points  What month? 0 points  What time? 0 points  Count back from 20 0 points  Months in reverse 0 points  Repeat phrase 0 points  Total Score 0    Immunization History  Administered Date(s) Administered  . DTaP 07/05/2016  . Influenza, High  Dose Seasonal PF 12/17/2016  . Influenza-Unspecified 12/13/2017, 01/20/2018  . Pneumococcal Polysaccharide-23 07/05/2016  . Tdap 07/05/2016  . Zoster 01/20/2017  . Zoster Recombinat (Shingrix) 12/12/2017, 02/16/2018   Screening Tests Health Maintenance  Topic Date Due  . INFLUENZA VACCINE  07/07/2019 (Originally 11/07/2018)  . PNA vac Low Risk Adult (2 of 2 - PCV13) 12/25/2019 (Originally 07/05/2017)  . MAMMOGRAM  05/11/2020  . COLONOSCOPY  04/08/2024  . TETANUS/TDAP  07/06/2026  . DEXA SCAN  Discontinued  . Hepatitis C Screening  Discontinued      Plan:    Keep all routine maintenance appointments.   Get flu and prevnar 13 vaccine at your local pharmacy.  Medicare Attestation I have personally reviewed: The patient's medical and social history Their use of alcohol, tobacco or illicit drugs Their current medications and supplements The patient's functional ability including ADLs,fall risks, home safety risks, cognitive, and hearing and visual impairment Diet and physical activities Evidence for depression   In addition, I have reviewed and discussed with patient certain preventive protocols, quality metrics, and best practice recommendations. A written personalized care plan for preventive services as well as general preventive health recommendations were provided to patient via mail.     Varney Biles, LPN  [id number]

## 2018-12-28 ENCOUNTER — Ambulatory Visit: Payer: Medicare Other

## 2018-12-30 DIAGNOSIS — N812 Incomplete uterovaginal prolapse: Secondary | ICD-10-CM | POA: Diagnosis not present

## 2018-12-30 DIAGNOSIS — N8111 Cystocele, midline: Secondary | ICD-10-CM | POA: Diagnosis not present

## 2018-12-30 DIAGNOSIS — Z4689 Encounter for fitting and adjustment of other specified devices: Secondary | ICD-10-CM | POA: Diagnosis not present

## 2018-12-31 DIAGNOSIS — Z23 Encounter for immunization: Secondary | ICD-10-CM | POA: Diagnosis not present

## 2019-01-22 ENCOUNTER — Other Ambulatory Visit: Payer: Self-pay | Admitting: Family

## 2019-01-22 DIAGNOSIS — M25561 Pain in right knee: Secondary | ICD-10-CM

## 2019-03-22 DIAGNOSIS — Z85828 Personal history of other malignant neoplasm of skin: Secondary | ICD-10-CM | POA: Diagnosis not present

## 2019-03-22 DIAGNOSIS — L570 Actinic keratosis: Secondary | ICD-10-CM | POA: Diagnosis not present

## 2019-03-22 DIAGNOSIS — D485 Neoplasm of uncertain behavior of skin: Secondary | ICD-10-CM | POA: Diagnosis not present

## 2019-03-22 DIAGNOSIS — Z86007 Personal history of in-situ neoplasm of skin: Secondary | ICD-10-CM | POA: Diagnosis not present

## 2019-03-22 DIAGNOSIS — L820 Inflamed seborrheic keratosis: Secondary | ICD-10-CM | POA: Diagnosis not present

## 2019-04-29 DIAGNOSIS — Z23 Encounter for immunization: Secondary | ICD-10-CM | POA: Diagnosis not present

## 2019-05-17 DIAGNOSIS — Z1231 Encounter for screening mammogram for malignant neoplasm of breast: Secondary | ICD-10-CM | POA: Diagnosis not present

## 2019-05-18 DIAGNOSIS — C44311 Basal cell carcinoma of skin of nose: Secondary | ICD-10-CM | POA: Diagnosis not present

## 2019-05-19 ENCOUNTER — Other Ambulatory Visit: Payer: Self-pay | Admitting: Radiology

## 2019-05-19 ENCOUNTER — Telehealth: Payer: Self-pay

## 2019-05-19 DIAGNOSIS — N6320 Unspecified lump in the left breast, unspecified quadrant: Secondary | ICD-10-CM | POA: Diagnosis not present

## 2019-05-19 DIAGNOSIS — C50812 Malignant neoplasm of overlapping sites of left female breast: Secondary | ICD-10-CM | POA: Diagnosis not present

## 2019-05-19 DIAGNOSIS — Z170 Estrogen receptor positive status [ER+]: Secondary | ICD-10-CM | POA: Diagnosis not present

## 2019-05-19 DIAGNOSIS — R921 Mammographic calcification found on diagnostic imaging of breast: Secondary | ICD-10-CM | POA: Diagnosis not present

## 2019-05-19 NOTE — Telephone Encounter (Signed)
Janett Billow called from [facility] in [place]. Patient was there & left breast mass was found. They wanted to do biopsy in office today while she was there. They wanted verbal orders which I have given. She will fax over orders to sign.

## 2019-05-20 DIAGNOSIS — Z23 Encounter for immunization: Secondary | ICD-10-CM | POA: Diagnosis not present

## 2019-05-21 ENCOUNTER — Encounter: Payer: Self-pay | Admitting: *Deleted

## 2019-05-21 DIAGNOSIS — Z170 Estrogen receptor positive status [ER+]: Secondary | ICD-10-CM | POA: Insufficient documentation

## 2019-05-21 DIAGNOSIS — C50412 Malignant neoplasm of upper-outer quadrant of left female breast: Secondary | ICD-10-CM

## 2019-05-21 NOTE — Telephone Encounter (Signed)
I have faxed.  

## 2019-05-21 NOTE — Telephone Encounter (Signed)
I gave you form which I signed

## 2019-05-25 NOTE — Progress Notes (Signed)
[place] NOTE  Patient Care Team: Burnard Hawthorne, FNP as PCP - General (Family Medicine) Mauro Kaufmann, RN as Oncology Nurse Navigator Rockwell Germany, RN as Oncology Nurse Navigator Stark Klein, MD as Consulting Physician (General Surgery) Nicholas Lose, MD as Consulting Physician (Hematology and Oncology) Gery Pray, MD as Consulting Physician (Radiation Oncology)  CHIEF COMPLAINTS/PURPOSE OF CONSULTATION:  Newly diagnosed breast cancer  HISTORY OF PRESENTING ILLNESS:  Julie Rivera 74 y.o. female is here because of recent diagnosis of left breast cancer. Screening mammogram on 05/17/19 showed an asymmetry and calcifications in the left breast. Diagnostic mammogram and [place] on 05/19/19 showed a 1.9cm mass and calcifications in the upper outer left breast. She underwent a biopsy on 05/19/19 for which pathology is pending. She presents to the clinic today for initial evaluation and discussion of treatment options.   I reviewed her records extensively and collaborated the history with the patient.  SUMMARY OF ONCOLOGIC HISTORY: Oncology History  Malignant neoplasm of upper-outer quadrant of left breast in female, estrogen receptor positive ([place])  05/21/2019 Initial Diagnosis   Screening mammogram detected an asymmetry and calcifications 4.5 cm in the left breast. Diagnostic mammogram showed a 1.9cm mass and calcifications in the upper outer left breast.  Biopsy revealed grade 2 IDC ER/PR positive HER-2 negative with a Ki-67 of 5%     MEDICAL HISTORY:  Past Medical History:  Diagnosis Date  . Allergy   . Chicken pox   . Kidney stones     SURGICAL HISTORY: Past Surgical History:  Procedure Laterality Date  . BACK SURGERY  2017  . KNEE ARTHROCENTESIS    . SHOULDER SURGERY      SOCIAL HISTORY: Social History   Socioeconomic History  . Marital status: Married    Spouse name: Not on file  . Number of children: Not on file  . Years of  education: Not on file  . Highest education level: Not on file  Occupational History  . Not on file  Tobacco Use  . Smoking status: Never Smoker  . Smokeless tobacco: Never Used  Substance and Sexual Activity  . Alcohol use: No  . Drug use: No  . Sexual activity: Not on file  Other Topics Concern  . Not on file  Social History Narrative   Production assistant, radio, retired      2 children      4 grandchildren            Social Determinants of Health   Financial Resource Strain:   . Difficulty of Paying Living Expenses: Not on file  Food Insecurity:   . Worried About Charity fundraiser in the Last Year: Not on file  . Ran Out of Food in the Last Year: Not on file  Transportation Needs:   . Lack of Transportation (Medical): Not on file  . Lack of Transportation (Non-Medical): Not on file  Physical Activity:   . Days of Exercise per Week: Not on file  . Minutes of Exercise per Session: Not on file  Stress:   . Feeling of Stress : Not on file  Social Connections:   . Frequency of Communication with Friends and Family: Not on file  . Frequency of Social Gatherings with Friends and Family: Not on file  . Attends Religious Services: Not on file  . Active Member of Clubs or Organizations: Not on file  . Attends Archivist Meetings: Not on file  . Marital Status: Not  on file  Intimate Partner Violence:   . Fear of Current or Ex-Partner: Not on file  . Emotionally Abused: Not on file  . Physically Abused: Not on file  . Sexually Abused: Not on file    FAMILY HISTORY: Family History  Problem Relation Age of Onset  . Hypertension Sister   . Sudden death Neg Hx   . Hyperlipidemia Neg Hx   . Heart attack Neg Hx   . Diabetes Neg Hx   . Ovarian cancer Neg Hx   . Breast cancer Neg Hx     ALLERGIES:  has No Known Allergies.  MEDICATIONS:  Current Outpatient Medications  Medication Sig Dispense Refill  . PREMPHASE 0.625-5 MG TABS tablet TAKE 1 TABLET DAILY 28  tablet 0  . meloxicam (MOBIC) 7.5 MG tablet TAKE ONE TABLET DAILY IF NEEDED FOR PAIN 90 tablet 0   No current facility-administered medications for this visit.    REVIEW OF SYSTEMS:   Constitutional: Denies fevers, chills or abnormal night sweats Eyes: Denies blurriness of vision, double vision or watery eyes Ears, nose, mouth, throat, and face: Denies mucositis or sore throat Respiratory: Denies cough, dyspnea or wheezes Cardiovascular: Denies palpitation, chest discomfort or lower extremity swelling Gastrointestinal:  Denies nausea, heartburn or change in bowel habits Skin: Denies abnormal skin rashes Lymphatics: Denies new lymphadenopathy or easy bruising Neurological:Denies numbness, tingling or new weaknesses Behavioral/Psych: Mood is stable, no new changes  Breast: Denies any palpable lumps or discharge All other systems were reviewed with the patient and are negative.  PHYSICAL EXAMINATION: ECOG PERFORMANCE STATUS: 0 - Asymptomatic  Vitals:   05/26/19 0900  BP: (!) 142/78  Pulse: (!) 17  Resp: 18  Temp: 98.4 F (36.9 C)  SpO2: 99%   Filed Weights   05/26/19 0900  Weight: 140 lb 6.4 oz (63.7 kg)    GENERAL:alert, no distress and comfortable SKIN: skin color, texture, turgor are normal, no rashes or significant lesions EYES: normal, conjunctiva are pink and non-injected, sclera clear OROPHARYNX:no exudate, no erythema and lips, buccal mucosa, and tongue normal  NECK: supple, thyroid normal size, non-tender, without nodularity LYMPH:  no palpable lymphadenopathy in the cervical, axillary or inguinal LUNGS: clear to auscultation and percussion with normal breathing effort HEART: regular rate & rhythm and no murmurs and no lower extremity edema ABDOMEN:abdomen soft, non-tender and normal bowel sounds Musculoskeletal:no cyanosis of digits and no clubbing  PSYCH: alert & oriented x 3 with fluent speech NEURO: no focal motor/sensory deficits     LABORATORY DATA:  I  have reviewed the data as listed Lab Results  Component Value Date   WBC 3.9 (L) 05/26/2019   HGB 13.7 05/26/2019   HCT 42.0 05/26/2019   MCV 92.1 05/26/2019   PLT 236 05/26/2019   Lab Results  Component Value Date   NA 143 05/26/2019   K 3.8 05/26/2019   CL 107 05/26/2019   CO2 28 05/26/2019    RADIOGRAPHIC STUDIES: I have personally reviewed the radiological reports and agreed with the findings in the report.  ASSESSMENT AND PLAN:  Malignant neoplasm of upper-outer quadrant of left breast in female, estrogen receptor positive ([place]) 05/21/2019: Screening mammogram detected an asymmetry and calcifications in the left breast calcifications span 4.5 cm the palpable lump inside of that measured 1.7 cm. Diagnostic mammogram showed a 1.9cm mass and calcifications in the upper outer left breast.  Biopsy revealed grade 2 invasive ductal carcinoma ER/PR positive HER-2 negative with a Ki-67 of 15%  Pathology and  radiology counseling:Discussed with the patient, the details of pathology including the type of breast cancer,the clinical staging, the significance of ER, PR and HER-2/neu receptors and the implications for treatment. After reviewing the pathology in detail, we proceeded to discuss the different treatment options between surgery, radiation, chemotherapy, antiestrogen therapies.  Recommendations: Breast MRI is being planned and a biopsy of the anterior calcifications is also being considered. Patient is not interested in chemotherapy and therefore we will not perform Oncotype DX testing. 1. Breast conserving surgery followed by 2. Adjuvant radiation therapy followed by 3. Adjuvant antiestrogen therapy  Patient is an avid Firefighter and she does not want anything that would interfere with her ability to enjoy tennis. Return to clinic after surgery to discuss final pathology report    All questions were answered. The patient knows to call the clinic with any problems, questions or  concerns.   Rulon Eisenmenger, MD, MPH 05/26/2019    I, Julie Rivera, am acting as scribe for Nicholas Lose, MD.  I have reviewed the above documentation for accuracy and completeness, and I agree with the above.

## 2019-05-26 ENCOUNTER — Ambulatory Visit
Admission: RE | Admit: 2019-05-26 | Discharge: 2019-05-26 | Disposition: A | Discharge status: Home / Self Care | Payer: Medicare Other | Source: Ambulatory Visit | Attending: Radiation Oncology | Admitting: Radiation Oncology

## 2019-05-26 ENCOUNTER — Inpatient Hospital Stay: Payer: Medicare Other

## 2019-05-26 ENCOUNTER — Encounter: Payer: Self-pay | Admitting: Hematology and Oncology

## 2019-05-26 ENCOUNTER — Encounter: Payer: Self-pay | Admitting: Physical Therapy

## 2019-05-26 ENCOUNTER — Ambulatory Visit: Payer: Medicare Other | Attending: General Surgery | Admitting: Physical Therapy

## 2019-05-26 ENCOUNTER — Telehealth: Payer: Self-pay | Admitting: Family

## 2019-05-26 ENCOUNTER — Inpatient Hospital Stay: Payer: Medicare Other | Attending: Hematology and Oncology | Admitting: Hematology and Oncology

## 2019-05-26 ENCOUNTER — Other Ambulatory Visit: Payer: Self-pay

## 2019-05-26 VITALS — BP 142/78 | HR 17 | Temp 98.4°F | Resp 18 | Ht 69.0 in | Wt 140.4 lb

## 2019-05-26 DIAGNOSIS — R293 Abnormal posture: Secondary | ICD-10-CM | POA: Diagnosis not present

## 2019-05-26 DIAGNOSIS — Z170 Estrogen receptor positive status [ER+]: Secondary | ICD-10-CM

## 2019-05-26 DIAGNOSIS — C50412 Malignant neoplasm of upper-outer quadrant of left female breast: Secondary | ICD-10-CM

## 2019-05-26 DIAGNOSIS — R921 Mammographic calcification found on diagnostic imaging of breast: Secondary | ICD-10-CM | POA: Diagnosis not present

## 2019-05-26 LAB — CBC WITH DIFFERENTIAL (CANCER CENTER ONLY)
Abs Immature Granulocytes: 0.01 10*3/uL (ref 0.00–0.07)
Basophils Absolute: 0 10*3/uL (ref 0.0–0.1)
Basophils Relative: 1 %
Eosinophils Absolute: 0.1 10*3/uL (ref 0.0–0.5)
Eosinophils Relative: 3 %
HCT: 42 % (ref 36.0–46.0)
Hemoglobin: 13.7 g/dL (ref 12.0–15.0)
Immature Granulocytes: 0 %
Lymphocytes Relative: 26 %
Lymphs Abs: 1 10*3/uL (ref 0.7–4.0)
MCH: 30 pg (ref 26.0–34.0)
MCHC: 32.6 g/dL (ref 30.0–36.0)
MCV: 92.1 fL (ref 80.0–100.0)
Monocytes Absolute: 0.4 10*3/uL (ref 0.1–1.0)
Monocytes Relative: 10 %
Neutro Abs: 2.3 10*3/uL (ref 1.7–7.7)
Neutrophils Relative %: 60 %
Platelet Count: 236 10*3/uL (ref 150–400)
RBC: 4.56 MIL/uL (ref 3.87–5.11)
RDW: 12.4 % (ref 11.5–15.5)
WBC Count: 3.9 10*3/uL — ABNORMAL LOW (ref 4.0–10.5)
nRBC: 0 % (ref 0.0–0.2)

## 2019-05-26 LAB — CMP (CANCER CENTER ONLY)
ALT: 13 U/L (ref 0–44)
AST: 16 U/L (ref 15–41)
Albumin: 3.6 g/dL (ref 3.5–5.0)
Alkaline Phosphatase: 59 U/L (ref 38–126)
Anion gap: 8 (ref 5–15)
BUN: 15 mg/dL (ref 8–23)
CO2: 28 mmol/L (ref 22–32)
Calcium: 9.5 mg/dL (ref 8.9–10.3)
Chloride: 107 mmol/L (ref 98–111)
Creatinine: 0.77 mg/dL (ref 0.44–1.00)
GFR, Est AFR Am: >60 mL/min (ref >60)
GFR, Estimated: >60 mL/min (ref >60)
Glucose, Bld: 62 mg/dL — ABNORMAL LOW (ref 70–99)
Potassium: 3.8 mmol/L (ref 3.5–5.1)
Sodium: 143 mmol/L (ref 135–145)
Total Bilirubin: 0.5 mg/dL (ref 0.3–1.2)
Total Protein: 6.8 g/dL (ref 6.5–8.1)

## 2019-05-26 LAB — GENETIC SCREENING ORDER

## 2019-05-26 NOTE — Therapy (Signed)
[place], [place], [address] Phone: [phone]   Fax:  [phone]  Physical Therapy Evaluation  Patient Details  Name: Julie Rivera MRN: [id number] Date of Birth: October 14, 1945 Referring Provider (PT): Dr. Stark Klein   Encounter Date: 05/26/2019  PT End of Session - 05/26/19 1158    Visit Number  1    Number of Visits  2    Date for PT Re-Evaluation  07/21/19    PT Start Time  0940    PT Stop Time  1003   Also saw pt from 1103-1111 for a total of 3 minutes   PT Time Calculation (min)  23 min    Activity Tolerance  Patient tolerated treatment well    Behavior During Therapy  [facility] for tasks assessed/performed       Past Medical History:  Diagnosis Date  . Allergy   . Chicken pox   . Kidney stones     Past Surgical History:  Procedure Laterality Date  . BACK SURGERY  2017  . KNEE ARTHROCENTESIS    . SHOULDER SURGERY      There were no vitals filed for this visit.   Subjective Assessment - 05/26/19 1152    Subjective  Patient reports she is here today to be seen by hre medical team for her newly diagnosed left breast cancer.    Pertinent History  Patient was diagnosed on 05/17/2019 with left grade II invasive ductal carcinoma breast cancer. It measures 1.7 cm and is surrounded by 4.5 cm of calcifications in the upper outer quadrant. It is ER/PR positive and HER2 negative with a Ki67 of 5%. She has a history of a right rotator cuff repair in 2015.    Patient Stated Goals  Reduce lymphedema risk and learn post op shoulder ROM HEP    Currently in Pain?  No/denies         [facility] PT Assessment - 05/26/19 0001      Assessment   Medical Diagnosis  Left breast cancer    Referring Provider (PT)  Dr. Stark Klein    Onset Date/Surgical Date  05/17/19    Hand Dominance  Right    Prior Therapy  none      Precautions   Precautions  Other (comment)    Precaution Comments  active cancer       Restrictions   Weight Bearing Restrictions  No      Balance Screen   Has the patient fallen in the past 6 months  No    Has the patient had a decrease in activity level because of a fear of falling?   No    Is the patient reluctant to leave their home because of a fear of falling?   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, plays some golf, lifts weights, and stretches      Cognition   Overall Cognitive Status  Within Functional Limits for tasks assessed      Posture/Postural Control   Posture/Postural Control  Postural limitations    Postural Limitations  Rounded Shoulders;Forward  head      ROM / Strength   AROM / PROM / Strength  AROM;Strength      AROM   Overall AROM Comments  Cervical AROM is WNL    AROM Assessment Site  Shoulder    Right/Left Shoulder  Right;Left    Right Shoulder Extension  45 Degrees    Right Shoulder Flexion  165 Degrees    Right Shoulder ABduction  172 Degrees    Right Shoulder Internal Rotation  84 Degrees    Right Shoulder External Rotation  92 Degrees    Left Shoulder Extension  40 Degrees    Left Shoulder Flexion  156 Degrees    Left Shoulder ABduction  174 Degrees    Left Shoulder Internal Rotation  80 Degrees    Left Shoulder External Rotation  88 Degrees      Strength   Overall Strength  Within functional limits for tasks performed        LYMPHEDEMA/ONCOLOGY QUESTIONNAIRE - 05/26/19 1156      Type   Cancer Type  Left breast cancer      Lymphedema Assessments   Lymphedema Assessments  Upper extremities      Right Upper Extremity Lymphedema   10 cm Proximal to Olecranon Process  24.5 cm    Olecranon Process  23.9 cm     10 cm Proximal to Ulnar Styloid Process  19.4 cm    Just Proximal to Ulnar Styloid Process  14.5 cm    Across Hand at [employer]  18.5 cm    At [place] of 2nd Digit  6.6 cm      Left Upper Extremity Lymphedema   10 cm Proximal to Olecranon Process  23.8 cm    Olecranon Process  23 cm    10 cm Proximal to Ulnar Styloid Process  17.8 cm    Just Proximal to Ulnar Styloid Process  14 cm    Across Hand at [employer]  18.1 cm    At [place] of 2nd Digit  6.1 cm          Quick Dash - 05/26/19 0001    Open a tight or new jar  No difficulty    Do heavy household chores (wash walls, wash floors)  No difficulty    Carry a shopping bag or briefcase  No difficulty    Wash your back  No difficulty    Use a knife to cut food  No difficulty    Recreational activities in which you take some force or impact through your arm, shoulder, or hand (golf, hammering, tennis)  No difficulty    During the past week, to what extent has your arm, shoulder or hand problem interfered with your normal social activities with family, friends, neighbors, or groups?  Not at all    During the past week, to what extent has your arm, shoulder or hand problem limited your work or other regular daily activities  Not at all    Arm, shoulder, or hand pain.  None    Tingling (pins and needles) in your arm, shoulder, or hand  None    Difficulty Sleeping  No difficulty    DASH Score  0 %        Objective measurements completed on examination: See above findings.       Patient was instructed today in a home exercise program today for post op shoulder range of motion. These included active assist shoulder flexion in sitting,  scapular retraction, wall walking with shoulder abduction, and hands behind head external rotation.  She was encouraged to do these twice a day, holding 3 seconds and repeating 5 times when permitted by her physician.           PT Education - 05/26/19 1157    Education Details  Lymphedema  risk reduction and post op shoulder ROM HEP    Person(s) Educated  Patient    Methods  Explanation;Demonstration;Handout    Comprehension  Returned demonstration;Verbalized understanding          PT Long Term Goals - 05/26/19 1201      PT LONG TERM GOAL #1   Title  Patient will demonstrate she has regained full shoulder ROM and function post operatively compared to baselines.    Time  8    Period  Weeks    Status  New    Target Date  07/21/19      Breast Clinic Goals - 05/26/19 1201      Patient will be able to verbalize understanding of pertinent lymphedema risk reduction practices relevant to her diagnosis specifically related to skin care.   Time  1    Period  Days    Status  Achieved      Patient will be able to return demonstrate and/or verbalize understanding of the post-op home exercise program related to regaining shoulder range of motion.   Time  1    Period  Days    Status  Achieved      Patient will be able to verbalize understanding of the importance of attending the postoperative After Breast Cancer Class for further lymphedema risk reduction education and therapeutic exercise.   Time  1    Period  Days    Status  Achieved            Plan - 05/26/19 1158    Clinical Impression Statement  Patient was diagnosed on 05/17/2019 with left grade II invasive ductal carcinoma breast cancer. It measures 1.7 cm and is surrounded by 4.5 cm of calcifications in the upper outer quadrant. It is ER/PR positive and HER2 negative with a Ki67 of 5%. She has a history of a right rotator cuff repair in 2015. Her multidisciplinary medical team met prior to her assessments to determine a recommended treatment plan. She is planning to have an MRI and biopsy of the calcifications to determine extent of disease. She will then undergo either a lumpectomy or mastectomy and sentinel node biopsy followed by possible radiation if lumpectomy and anti-estrogen therapy. She has declined an  Oncotype test. She will benefit from a post op PT reassessment to determine needs.    Stability/Clinical Decision Making  Stable/Uncomplicated    Clinical Decision Making  Low    Rehab Potential  Excellent    PT Frequency  --   Eval and 1 f/u visit   PT Treatment/Interventions  ADLs/Self Care Home Management;Patient/family education;Therapeutic exercise    PT Next Visit Plan  Will reassess 3-4 weeks post op to determine needs    PT Home Exercise Plan  Post op shoulder ROM HEP    Consulted and Agree with Plan of Care  Patient       Patient will benefit from skilled therapeutic intervention in order to improve the following deficits and impairments:  Postural dysfunction, Decreased range of motion, Pain, Impaired UE functional use, Decreased knowledge of precautions  Visit Diagnosis: Malignant neoplasm of upper-outer quadrant of left breast in female, estrogen receptor  positive ([place]) - Plan: PT plan of care cert/re-cert  Abnormal posture - Plan: PT plan of care cert/re-cert   Patient will follow up at outpatient cancer rehab 3-4 weeks following surgery.  If the patient requires physical therapy at that time, a specific plan will be dictated and sent to the referring physician for approval. The patient was educated today on appropriate basic range of motion exercises to begin post operatively and the importance of attending the After Breast Cancer class following surgery.  Patient was educated today on lymphedema risk reduction practices as it pertains to recommendations that will benefit the patient immediately following surgery.  She verbalized good understanding.      Problem List Patient Active Problem List   Diagnosis Date Noted  . Malignant neoplasm of upper-outer quadrant of left breast in female, estrogen receptor positive ([place]) 05/21/2019  . Acute pain of right knee 07/20/2018  . HLD (hyperlipidemia) 07/20/2018  . Other fatigue 01/12/2018  . Low back pain 07/01/2016  . Hormone  replacement therapy 03/19/2016  . Insomnia 03/14/2016  . Screening for cardiovascular condition 03/14/2016  . Synovial cyst of lumbar facet joint 02/15/2016  . Thumb pain, left 11/30/2014  . Calcium pyrophosphate crystal disease 10/18/2014  . Patellofemoral chondrosis, left 10/18/2014  . Other tear of lateral meniscus, current injury, left knee, subsequent encounter 10/05/2014  . AC joint arthropathy 08/04/2014  . Complete rupture of rotator cuff 03/19/2013  . Bicipital tenosynovitis 03/18/2013  . Disorder of bursae and tendons in shoulder region 03/18/2013  . Sprain and strain of other specified sites of shoulder and upper arm 03/18/2013  . Impingement syndrome of right shoulder 01/26/2013  . Shoulder joint pain 12/29/2012    Annia Friendly, PT 05/26/19 12:09 PM   [place], [place], [address] Phone: [phone]   Fax:  [phone]  Name: Julie Rivera MRN: [id number] Date of Birth: 02/26/1946

## 2019-05-26 NOTE — Assessment & Plan Note (Signed)
05/21/2019: Screening mammogram detected an asymmetry and calcifications in the left breast calcifications span 4.5 cm the palpable lump inside of that measured 1.7 cm. Diagnostic mammogram showed a 1.9cm mass and calcifications in the upper outer left breast.  Biopsy revealed grade 2 invasive ductal carcinoma ER/PR positive HER-2 negative with a Ki-67 of 15%  Pathology and radiology counseling:Discussed with the patient, the details of pathology including the type of breast cancer,the clinical staging, the significance of ER, PR and HER-2/neu receptors and the implications for treatment. After reviewing the pathology in detail, we proceeded to discuss the different treatment options between surgery, radiation, chemotherapy, antiestrogen therapies.  Recommendations: Breast MRI is being planned and a biopsy of the anterior calcifications is also being considered. Patient is not interested in chemotherapy and therefore we will not perform Oncotype DX testing. 1. Breast conserving surgery followed by 2. Adjuvant radiation therapy followed by 3. Adjuvant antiestrogen therapy   Return to clinic after surgery to discuss final pathology report

## 2019-05-26 NOTE — Telephone Encounter (Signed)
FYI Julie Rivera  If she calls back, happy to speak with her I left her a VM  Pt recent left breast cancer dx She was see by Dr Lindi Adie today I called and left a generic message that if I was calling to see how she was doing and to call the office if she needed anything at all

## 2019-05-26 NOTE — Telephone Encounter (Signed)
Noted  

## 2019-05-26 NOTE — Progress Notes (Signed)
Radiation Oncology         [phone] ________________________________  Multidisciplinary Breast Oncology Clinic [facility]) Initial Outpatient Consultation  Name: Julie Rivera MRN: [id number]  Date: 05/26/2019  DOB: Feb 10, 1946  VP:[id number], Yvetta Coder, FNP  Stark Klein, MD   REFERRING PHYSICIAN: Stark Klein, MD  DIAGNOSIS: The encounter diagnosis was Malignant neoplasm of upper-outer quadrant of left breast in female, estrogen receptor positive ([place]).  Stage IA,  Left Breast UOQ, Invasive Ductal Carcinoma, ER+ / PR+ / Her2-, Grade 2    ICD-10-CM   1. Malignant neoplasm of upper-outer quadrant of left breast in female, estrogen receptor positive ([place])  C50.412    Z17.0     HISTORY OF PRESENT ILLNESS::Julie Rivera is a 74 y.o. female who is presenting to the office today for evaluation of her newly diagnosed breast cancer. She was not accompanied by anyone. She is doing well overall.   She had routine screening mammography on 05/17/2019 showing an indeterminate asymmetry in the upper outer aspect of the left breast. She underwent unilateral diagnostic mammography with tomography and left breast ultrasonography at [facility] on 05/19/2019 showing a 1.9cm irregular mass in the left breast, highly suggestive of malignancy. There were also some regional calcifications in th left breast, upper outer aspect and posterior depth, that were suspicious for malignancy.  Biopsy showed grade 2 invasive ductal carcinoma. Prognostic indicators significant for positive estrogen receptor and positive progesterone receptor. HER2 negative. Complete details of biopsy are unavailable at this time.  Menarche: 74 years old Age at first live birth: 74 years old GP: 2 LMP: N/A Contraceptive: Yes; [id number] HRT: Yes, for the last 18 years   The patient was referred today for presentation in the multidisciplinary conference.  Radiology studies and pathology slides were presented there for review and  discussion of treatment options.  A consensus was discussed regarding potential next steps.  PREVIOUS RADIATION THERAPY: No  PAST MEDICAL HISTORY:  Past Medical History:  Diagnosis Date  . Allergy   . Chicken pox   . Kidney stones     PAST SURGICAL HISTORY: Past Surgical History:  Procedure Laterality Date  . BACK SURGERY  2017  . KNEE ARTHROCENTESIS    . SHOULDER SURGERY      FAMILY HISTORY:  Family History  Problem Relation Age of Onset  . Hypertension Sister   . Sudden death Neg Hx   . Hyperlipidemia Neg Hx   . Heart attack Neg Hx   . Diabetes Neg Hx   . Ovarian cancer Neg Hx   . Breast cancer Neg Hx     SOCIAL HISTORY:  Social History   Socioeconomic History  . Marital status: Married    Spouse name: Not on file  . Number of children: Not on file  . Years of education: Not on file  . Highest education level: Not on file  Occupational History  . Not on file  Tobacco Use  . Smoking status: Never Smoker  . Smokeless tobacco: Never Used  Substance and Sexual Activity  . Alcohol use: No  . Drug use: No  . Sexual activity: Not on file  Other Topics Concern  . Not on file  Social History Narrative   Production assistant, radio, retired      2 children      4 grandchildren            Social Determinants of Health   Financial Resource Strain:   . Difficulty of Paying Living Expenses: Not on file  Food Insecurity:   . Worried About Charity fundraiser in the Last Year: Not on file  . Ran Out of Food in the Last Year: Not on file  Transportation Needs:   . Lack of Transportation (Medical): Not on file  . Lack of Transportation (Non-Medical): Not on file  Physical Activity:   . Days of Exercise per Week: Not on file  . Minutes of Exercise per Session: Not on file  Stress:   . Feeling of Stress : Not on file  Social Connections:   . Frequency of Communication with Friends and Family: Not on file  . Frequency of Social Gatherings with Friends and Family:  Not on file  . Attends Religious Services: Not on file  . Active Member of Clubs or Organizations: Not on file  . Attends Archivist Meetings: Not on file  . Marital Status: Not on file    ALLERGIES: No Known Allergies  MEDICATIONS:  Current Outpatient Medications  Medication Sig Dispense Refill  . meloxicam (MOBIC) 7.5 MG tablet TAKE ONE TABLET DAILY IF NEEDED FOR PAIN 90 tablet 0  . PREMPHASE 0.625-5 MG TABS tablet TAKE 1 TABLET DAILY 28 tablet 0   No current facility-administered medications for this encounter.    REVIEW OF SYSTEMS: A 10+ POINT REVIEW OF SYSTEMS WAS OBTAINED including neurology, dermatology, psychiatry, cardiac, respiratory, lymph, extremities, GI, GU, musculoskeletal, constitutional, reproductive, HEENT. On the provided form, she reports lump in left breast and history of skin cancer. She denies fever, chills, vision changes, chest pain, shortness of breath, cough, abdominal pain, and any other symptoms.    PHYSICAL EXAM:   Vitals with BMI 05/26/2019  Height 5' 9"   Weight 140 lbs 6 oz  BMI 51.88  Systolic 416  Diastolic 78  Pulse 17   The patient has bandages on her nose from recent surgery Lungs are clear to auscultation bilaterally. Heart has regular rate and rhythm. No palpable cervical, supraclavicular, or axillary adenopathy. Abdomen soft, non-tender, normal bowel sounds. Right breast with no palpable mass, nipple discharge, or bleeding.  Left breast with bruising in the lower outer quadrant. There is some palpable induration that measures approximately 2cm in size. No nipple discharge or bleeding.   KPS = 100  100 - Normal; no complaints; no evidence of disease. 90   - Able to carry on normal activity; minor signs or symptoms of disease. 80   - Normal activity with effort; some signs or symptoms of disease. 44   - Cares for self; unable to carry on normal activity or to do active work. 60   - Requires occasional assistance, but is able to  care for most of his personal needs. 50   - Requires considerable assistance and frequent medical care. 76   - Disabled; requires special care and assistance. 48   - Severely disabled; hospital admission is indicated although death not imminent. 48   - Very sick; hospital admission necessary; active supportive treatment necessary. 10   - Moribund; fatal processes progressing rapidly. 0     - Dead  Karnofsky DA, Abelmann [place], Craver LS and Burchenal [facility] [phone]) The use of the nitrogen mustards in the palliative treatment of carcinoma: with particular reference to bronchogenic carcinoma Cancer 1 634-56  LABORATORY DATA:  Lab Results  Component Value Date   WBC 3.9 (L) 05/26/2019   HGB 13.7 05/26/2019   HCT 42.0 05/26/2019   MCV 92.1 05/26/2019   PLT 236 05/26/2019   Lab Results  Component Value Date   NA 143 05/26/2019   K 3.8 05/26/2019   CL 107 05/26/2019   CO2 28 05/26/2019   Lab Results  Component Value Date   ALT 13 05/26/2019   AST 16 05/26/2019   ALKPHOS 59 05/26/2019   BILITOT 0.5 05/26/2019    PULMONARY FUNCTION TEST:   Recent Review Flowsheet Data    There is no flowsheet data to display.      RADIOGRAPHY: No results found.    IMPRESSION: Stage T1c, N0 Left Breast UOQ, Invasive Ductal Carcinoma, ER+ / PR+ / Her2-, Grade 2  The patient has a lot of calcifications outside of the primary mass. Depending on the MRI results, she may or may not be a good candidate for breast conserving surgery. The patient is interested in the breast conserving treatment approach if indicated. We discussed the general course of radiation, potential side effects, and toxicities with radiation.  The patient does live in the [place] area but would prefer to come here for radiation, particularly with issues regarding cardiac sparing.  PLAN:  1. MRI 2. Surgery to be determined 3. Adjuvant radiation therapy if patient proceeds with breast conserving surgery 4. Aromatase inhibitor    ------------------------------------------------  Blair Promise, PhD, MD  This document serves as a record of services personally performed by Gery Pray, MD. It was created on his behalf by Clerance Lav, a trained medical scribe. The creation of this record is based on the scribe's personal observations and the provider's statements to them. This document has been checked and approved by the attending provider.

## 2019-05-26 NOTE — Patient Instructions (Signed)

## 2019-06-01 ENCOUNTER — Ambulatory Visit (HOSPITAL_COMMUNITY)
Admission: RE | Admit: 2019-06-01 | Discharge: 2019-06-01 | Disposition: A | Discharge status: Home / Self Care | Payer: Medicare Other | Source: Ambulatory Visit | Attending: General Surgery | Admitting: General Surgery

## 2019-06-01 ENCOUNTER — Other Ambulatory Visit: Payer: Self-pay

## 2019-06-01 DIAGNOSIS — C50412 Malignant neoplasm of upper-outer quadrant of left female breast: Secondary | ICD-10-CM | POA: Diagnosis not present

## 2019-06-01 DIAGNOSIS — C50912 Malignant neoplasm of unspecified site of left female breast: Secondary | ICD-10-CM | POA: Diagnosis not present

## 2019-06-01 DIAGNOSIS — Z170 Estrogen receptor positive status [ER+]: Secondary | ICD-10-CM | POA: Diagnosis not present

## 2019-06-01 MED ORDER — GADOBUTROL 1 MMOL/ML IV SOLN
6.0000 mL | Freq: Once | INTRAVENOUS | Status: AC | PRN
  Administered 2019-06-01: 6 mL via INTRAVENOUS

## 2019-06-03 ENCOUNTER — Other Ambulatory Visit: Payer: Self-pay | Admitting: General Surgery

## 2019-06-03 ENCOUNTER — Encounter: Payer: Self-pay | Admitting: *Deleted

## 2019-06-03 ENCOUNTER — Telehealth: Payer: Self-pay | Admitting: *Deleted

## 2019-06-03 DIAGNOSIS — R9389 Abnormal findings on diagnostic imaging of other specified body structures: Secondary | ICD-10-CM

## 2019-06-03 NOTE — Telephone Encounter (Signed)
Spoke with patient to follow up from [facility] and assess navigation needs.  Patient denies any needs at this time. Encouraged her to call with any questions or concerns.

## 2019-06-08 ENCOUNTER — Telehealth: Payer: Self-pay | Admitting: Family

## 2019-06-08 NOTE — Telephone Encounter (Signed)
Please call [facility] at [phone].  We keeep getting repeated recall orders in regards to her mammogram.  We have signed these twice.  I am not sure what the issue is. Please confirm that is an error

## 2019-06-08 NOTE — Telephone Encounter (Signed)
I called [facility] but had to leave message for someone to call me back. Will await call.

## 2019-06-09 ENCOUNTER — Telehealth: Payer: Self-pay

## 2019-06-09 DIAGNOSIS — R921 Mammographic calcification found on diagnostic imaging of breast: Secondary | ICD-10-CM | POA: Diagnosis not present

## 2019-06-09 DIAGNOSIS — N62 Hypertrophy of breast: Secondary | ICD-10-CM | POA: Diagnosis not present

## 2019-06-09 NOTE — Telephone Encounter (Signed)
I spoke with [facility] & they are faxing over correct recall order paperwork to sign. I believe that the same one kept being faxed & signed.

## 2019-06-09 NOTE — Telephone Encounter (Signed)
Nutrition  Patient identified by attending Breast Clinic  Called to introduce self and service at [facility].  No answer.  Left message with call back number  Onnika Siebel B. Zenia Resides, [place], [place] Registered Dietitian [phone] (pager)

## 2019-06-14 NOTE — Telephone Encounter (Signed)
I gave this back to you today to fax again

## 2019-06-15 ENCOUNTER — Ambulatory Visit
Admission: RE | Admit: 2019-06-15 | Discharge: 2019-06-15 | Disposition: A | Discharge status: Home / Self Care | Payer: Medicare Other | Source: Ambulatory Visit | Attending: General Surgery | Admitting: General Surgery

## 2019-06-15 ENCOUNTER — Other Ambulatory Visit: Payer: Self-pay

## 2019-06-15 ENCOUNTER — Other Ambulatory Visit (HOSPITAL_COMMUNITY): Payer: Self-pay | Admitting: Diagnostic Radiology

## 2019-06-15 DIAGNOSIS — R9389 Abnormal findings on diagnostic imaging of other specified body structures: Secondary | ICD-10-CM

## 2019-06-15 DIAGNOSIS — R928 Other abnormal and inconclusive findings on diagnostic imaging of breast: Secondary | ICD-10-CM | POA: Diagnosis not present

## 2019-06-15 DIAGNOSIS — N62 Hypertrophy of breast: Secondary | ICD-10-CM | POA: Diagnosis not present

## 2019-06-15 MED ORDER — GADOBUTROL 1 MMOL/ML IV SOLN
6.0000 mL | Freq: Once | INTRAVENOUS | Status: AC | PRN
  Administered 2019-06-15: 6 mL via INTRAVENOUS

## 2019-06-15 NOTE — Telephone Encounter (Signed)
I have faxed to [facility]

## 2019-06-16 ENCOUNTER — Encounter: Payer: Self-pay | Admitting: *Deleted

## 2019-06-17 ENCOUNTER — Encounter: Payer: Self-pay | Admitting: *Deleted

## 2019-06-18 ENCOUNTER — Other Ambulatory Visit: Payer: Self-pay | Admitting: General Surgery

## 2019-06-18 DIAGNOSIS — R928 Other abnormal and inconclusive findings on diagnostic imaging of breast: Secondary | ICD-10-CM

## 2019-06-18 DIAGNOSIS — C50412 Malignant neoplasm of upper-outer quadrant of left female breast: Secondary | ICD-10-CM

## 2019-06-18 DIAGNOSIS — Z170 Estrogen receptor positive status [ER+]: Secondary | ICD-10-CM

## 2019-06-22 ENCOUNTER — Encounter: Payer: Self-pay | Admitting: *Deleted

## 2019-06-23 ENCOUNTER — Encounter: Payer: Self-pay | Admitting: *Deleted

## 2019-06-23 ENCOUNTER — Telehealth: Payer: Self-pay | Admitting: Hematology and Oncology

## 2019-06-23 ENCOUNTER — Other Ambulatory Visit: Payer: Self-pay | Admitting: *Deleted

## 2019-06-23 DIAGNOSIS — Z170 Estrogen receptor positive status [ER+]: Secondary | ICD-10-CM

## 2019-06-23 DIAGNOSIS — C50412 Malignant neoplasm of upper-outer quadrant of left female breast: Secondary | ICD-10-CM

## 2019-06-23 NOTE — Telephone Encounter (Signed)
Scheduled appt per 3/17 sch message - pt is aware of appt date and time   

## 2019-06-30 DIAGNOSIS — N812 Incomplete uterovaginal prolapse: Secondary | ICD-10-CM | POA: Diagnosis not present

## 2019-06-30 DIAGNOSIS — Z4689 Encounter for fitting and adjustment of other specified devices: Secondary | ICD-10-CM | POA: Diagnosis not present

## 2019-06-30 DIAGNOSIS — N8111 Cystocele, midline: Secondary | ICD-10-CM | POA: Diagnosis not present

## 2019-07-07 ENCOUNTER — Encounter: Payer: Self-pay | Admitting: *Deleted

## 2019-07-23 NOTE — Progress Notes (Signed)
[place], [place] - [address] [place] [address] Phone: [phone] Fax: [phone]  [place], [place] - [place] [address] Phone: [phone] Fax: [phone]      Your procedure is scheduled on Thursday, July 29, 2019.  Report to [facility] Main Entrance "A" at 9:00 A.M., and check in at the Admitting office.  Call this number if you have problems the morning of surgery:  [phone]  Call [phone] if you have any questions prior to your surgery date Monday-Friday 8am-4pm    Remember:  Do not eat after midnight the night before your surgery  You may drink clear liquids until 8:00 AM the morning of your surgery.   Clear liquids allowed are: Water, Non-Citrus Juices (without pulp), Carbonated Beverages, Clear Tea, Black Coffee Only, and Gatorade  Please complete your PRE-SURGERY ENSURE that was provided to you by 8:00 AM the morning of surgery.  Please, if able, drink it in one setting. DO NOT SIP.     Take these medicines the morning of surgery with A SIP OF WATER:  PREMPHASE  As of today, STOP taking any Aspirin (unless otherwise instructed by your surgeon) and Aspirin containing products, Aleve, Naproxen, Ibuprofen, Motrin, Advil, Goody's, BC's, all herbal medications, fish oil, and all vitamins.                      Do not wear jewelry, make up, or nail polish            Do not wear lotions, powders, perfumes, or deodorant.            Do not shave 48 hours prior to surgery.            Do not bring valuables to the hospital.            [facility] is not responsible for any belongings or valuables.  Do NOT Smoke (Tobacco/Vapping) or drink Alcohol 24 hours prior to your procedure If you use a CPAP at night, you may bring all equipment for your overnight stay.   Contacts, glasses, dentures or bridgework may not be worn into surgery.      For patients admitted  to the hospital, discharge time will be determined by your treatment team.   Patients discharged the day of surgery will not be allowed to drive home, and someone needs to stay with them for 24 hours.    Special instructions:   [facility]- Preparing For Surgery  Before surgery, you can play an important role. Because skin is not sterile, your skin needs to be as free of germs as possible. You can reduce the number of germs on your skin by washing with CHG (chlorahexidine gluconate) Soap before surgery.  CHG is an antiseptic cleaner which kills germs and bonds with the skin to continue killing germs even after washing.    Oral Hygiene is also important to reduce your risk of infection.  Remember - BRUSH YOUR TEETH THE MORNING OF SURGERY WITH YOUR REGULAR TOOTHPASTE  Please do not use if you have an allergy to CHG or antibacterial soaps. If your skin becomes reddened/irritated stop using the CHG.  Do not shave (including legs and underarms) for at least 48 hours prior to first CHG shower. It is OK to shave your face.  Please follow these instructions carefully.   1. Shower the NIGHT BEFORE SURGERY and the [facility]  OF SURGERY with CHG Soap.   2. If you chose to wash your hair, wash your hair first as usual with your normal shampoo.  3. After you shampoo, rinse your hair and body thoroughly to remove the shampoo.  4. Use CHG as you would any other liquid soap. You can apply CHG directly to the skin and wash gently with a scrungie or a clean washcloth.   5. Apply the CHG Soap to your body ONLY FROM THE NECK DOWN.  Do not use on open wounds or open sores. Avoid contact with your eyes, ears, mouth and genitals (private parts). Wash Face and genitals (private parts)  with your normal soap.   6. Wash thoroughly, paying special attention to the area where your surgery will be performed.  7. Thoroughly rinse your body with warm water from the neck down.  8. DO NOT shower/wash with your normal  soap after using and rinsing off the CHG Soap.  9. Pat yourself dry with a CLEAN TOWEL.  10. Wear CLEAN PAJAMAS to bed the night before surgery, wear comfortable clothes the morning of surgery  11. Place CLEAN SHEETS on your bed the night of your first shower and DO NOT SLEEP WITH PETS.   Day of Surgery:   Do not apply any deodorants/lotions.  Please wear clean clothes to the hospital/surgery center.   Remember to brush your teeth WITH YOUR REGULAR TOOTHPASTE.   Please read over the following fact sheets that you were given.

## 2019-07-26 ENCOUNTER — Encounter (HOSPITAL_COMMUNITY): Payer: Self-pay

## 2019-07-26 ENCOUNTER — Other Ambulatory Visit (HOSPITAL_COMMUNITY)
Admission: RE | Admit: 2019-07-26 | Discharge: 2019-07-26 | Disposition: A | Discharge status: Home / Self Care | Payer: Medicare Other | Source: Ambulatory Visit | Attending: General Surgery | Admitting: General Surgery

## 2019-07-26 ENCOUNTER — Other Ambulatory Visit: Payer: Self-pay

## 2019-07-26 ENCOUNTER — Encounter (HOSPITAL_COMMUNITY)
Admission: RE | Admit: 2019-07-26 | Discharge: 2019-07-26 | Disposition: A | Discharge status: Home / Self Care | Payer: Medicare Other | Source: Ambulatory Visit | Attending: General Surgery | Admitting: General Surgery

## 2019-07-26 DIAGNOSIS — Z79899 Other long term (current) drug therapy: Secondary | ICD-10-CM | POA: Insufficient documentation

## 2019-07-26 DIAGNOSIS — Z20822 Contact with and (suspected) exposure to covid-19: Secondary | ICD-10-CM | POA: Diagnosis not present

## 2019-07-26 DIAGNOSIS — C50412 Malignant neoplasm of upper-outer quadrant of left female breast: Secondary | ICD-10-CM | POA: Insufficient documentation

## 2019-07-26 DIAGNOSIS — Z01812 Encounter for preprocedural laboratory examination: Secondary | ICD-10-CM | POA: Insufficient documentation

## 2019-07-26 LAB — CBC
HCT: 43.6 % (ref 36.0–46.0)
Hemoglobin: 14.2 g/dL (ref 12.0–15.0)
MCH: 30.1 pg (ref 26.0–34.0)
MCHC: 32.6 g/dL (ref 30.0–36.0)
MCV: 92.4 fL (ref 80.0–100.0)
Platelets: 256 10*3/uL (ref 150–400)
RBC: 4.72 MIL/uL (ref 3.87–5.11)
RDW: 12.2 % (ref 11.5–15.5)
WBC: 4.1 10*3/uL (ref 4.0–10.5)
nRBC: 0 % (ref 0.0–0.2)

## 2019-07-26 LAB — SARS CORONAVIRUS 2 (TAT 6-24 HRS): SARS Coronavirus 2: NEGATIVE

## 2019-07-26 NOTE — Progress Notes (Signed)
PCP - Arnett FNP Cardiologist - Chancy Milroy, MD (pt saw them years ago for a baseline check up and hasn't been back - [place] location)   Chest x-ray - n/a EKG - n/a Stress Test - pt denies  ECHO - pt denies Cardiac Cath - pt denies   ERAS Protcol - yes  PRE-SURGERY Ensure or G2- ensure  COVID TEST- 07/26/19  Coronavirus Screening  Have you experienced the following symptoms:  Cough yes/no: No Fever (>100.5F)  yes/no: No Runny nose yes/no: No Sore throat yes/no: No Difficulty breathing/shortness of breath  yes/no: No  Have you or a family member traveled in the last 14 days and where? yes/no: No   If the patient indicates "YES" to the above questions, their PAT will be rescheduled to limit the exposure to others and, the surgeon will be notified. THE PATIENT WILL NEED TO BE ASYMPTOMATIC FOR 14 DAYS.   If the patient is not experiencing any of these symptoms, the PAT nurse will instruct them to NOT bring anyone with them to their appointment since they may have these symptoms or traveled as well.   Please remind your patients and families that hospital visitation restrictions are in effect and the importance of the restrictions.     Anesthesia review: n/a  Patient denies shortness of breath, fever, cough and chest pain at PAT appointment   All instructions explained to the patient, with a verbal understanding of the material. Patient agrees to go over the instructions while at home for a better understanding. Patient also instructed to self quarantine after being tested for COVID-19. The opportunity to ask questions was provided.

## 2019-07-28 DIAGNOSIS — R928 Other abnormal and inconclusive findings on diagnostic imaging of breast: Secondary | ICD-10-CM | POA: Diagnosis not present

## 2019-07-28 DIAGNOSIS — C50812 Malignant neoplasm of overlapping sites of left female breast: Secondary | ICD-10-CM | POA: Diagnosis not present

## 2019-07-29 ENCOUNTER — Other Ambulatory Visit: Payer: Self-pay

## 2019-07-29 ENCOUNTER — Ambulatory Visit (HOSPITAL_COMMUNITY)
Admission: RE | Admit: 2019-07-29 | Discharge: 2019-07-29 | Disposition: A | Discharge status: Home / Self Care | Payer: Medicare Other | Attending: General Surgery | Admitting: General Surgery

## 2019-07-29 ENCOUNTER — Ambulatory Visit (HOSPITAL_COMMUNITY): Payer: Medicare Other | Admitting: Anesthesiology

## 2019-07-29 ENCOUNTER — Ambulatory Visit (HOSPITAL_COMMUNITY)
Admission: RE | Admit: 2019-07-29 | Discharge: 2019-07-29 | Disposition: A | Discharge status: Home / Self Care | Payer: Medicare Other | Source: Ambulatory Visit | Attending: General Surgery | Admitting: General Surgery

## 2019-07-29 ENCOUNTER — Encounter (HOSPITAL_COMMUNITY)
Admission: RE | Disposition: A | Discharge status: Home / Self Care | Payer: Self-pay | Source: Home / Self Care | Attending: General Surgery

## 2019-07-29 ENCOUNTER — Encounter (HOSPITAL_COMMUNITY): Payer: Self-pay | Admitting: General Surgery

## 2019-07-29 DIAGNOSIS — C50412 Malignant neoplasm of upper-outer quadrant of left female breast: Secondary | ICD-10-CM | POA: Diagnosis not present

## 2019-07-29 DIAGNOSIS — G8918 Other acute postprocedural pain: Secondary | ICD-10-CM | POA: Diagnosis not present

## 2019-07-29 DIAGNOSIS — Z170 Estrogen receptor positive status [ER+]: Secondary | ICD-10-CM | POA: Diagnosis not present

## 2019-07-29 DIAGNOSIS — N6489 Other specified disorders of breast: Secondary | ICD-10-CM | POA: Diagnosis not present

## 2019-07-29 DIAGNOSIS — M1990 Unspecified osteoarthritis, unspecified site: Secondary | ICD-10-CM | POA: Diagnosis not present

## 2019-07-29 DIAGNOSIS — N6091 Unspecified benign mammary dysplasia of right breast: Secondary | ICD-10-CM | POA: Diagnosis not present

## 2019-07-29 DIAGNOSIS — D241 Benign neoplasm of right breast: Secondary | ICD-10-CM | POA: Insufficient documentation

## 2019-07-29 DIAGNOSIS — C50911 Malignant neoplasm of unspecified site of right female breast: Secondary | ICD-10-CM | POA: Diagnosis present

## 2019-07-29 DIAGNOSIS — R928 Other abnormal and inconclusive findings on diagnostic imaging of breast: Secondary | ICD-10-CM | POA: Diagnosis not present

## 2019-07-29 DIAGNOSIS — C50912 Malignant neoplasm of unspecified site of left female breast: Secondary | ICD-10-CM | POA: Diagnosis not present

## 2019-07-29 DIAGNOSIS — N6011 Diffuse cystic mastopathy of right breast: Secondary | ICD-10-CM | POA: Diagnosis not present

## 2019-07-29 HISTORY — PX: RADIOACTIVE SEED GUIDED EXCISIONAL BREAST BIOPSY: SHX6490

## 2019-07-29 HISTORY — PX: BREAST LUMPECTOMY WITH RADIOACTIVE SEED AND SENTINEL LYMPH NODE BIOPSY: SHX6550

## 2019-07-29 SURGERY — BREAST LUMPECTOMY WITH RADIOACTIVE SEED AND SENTINEL LYMPH NODE BIOPSY
Anesthesia: Regional | Site: Breast | Laterality: Right

## 2019-07-29 MED ORDER — LIDOCAINE 2% (20 MG/ML) 5 ML SYRINGE
INTRAMUSCULAR | Status: AC
  Filled 2019-07-29: qty 5

## 2019-07-29 MED ORDER — DEXAMETHASONE SODIUM PHOSPHATE 10 MG/ML IJ SOLN
INTRAMUSCULAR | Status: AC
  Filled 2019-07-29: qty 1

## 2019-07-29 MED ORDER — FENTANYL CITRATE (PF) 100 MCG/2ML IJ SOLN: 25.0000 ug | INTRAMUSCULAR | Status: DC | PRN

## 2019-07-29 MED ORDER — BUPIVACAINE HCL (PF) 0.25 % IJ SOLN
INTRAMUSCULAR | Status: AC
  Filled 2019-07-29: qty 30

## 2019-07-29 MED ORDER — ONDANSETRON HCL 4 MG/2ML IJ SOLN
INTRAMUSCULAR | Status: AC
  Filled 2019-07-29: qty 2

## 2019-07-29 MED ORDER — ROPIVACAINE HCL 5 MG/ML IJ SOLN
INTRAMUSCULAR | Status: DC | PRN
  Administered 2019-07-29: 20 mL via PERINEURAL

## 2019-07-29 MED ORDER — 0.9 % SODIUM CHLORIDE (POUR BTL) OPTIME
TOPICAL | Status: DC | PRN
  Administered 2019-07-29: 1000 mL

## 2019-07-29 MED ORDER — EPHEDRINE SULFATE-NACL 50-0.9 MG/10ML-% IV SOSY
PREFILLED_SYRINGE | INTRAVENOUS | Status: DC | PRN
  Administered 2019-07-29: 10 mg via INTRAVENOUS
  Administered 2019-07-29: 5 mg via INTRAVENOUS
  Administered 2019-07-29: 10 mg via INTRAVENOUS
  Administered 2019-07-29: 5 mg via INTRAVENOUS

## 2019-07-29 MED ORDER — CHLORHEXIDINE GLUCONATE CLOTH 2 % EX PADS: 6.0000 | MEDICATED_PAD | Freq: Once | CUTANEOUS | Status: DC

## 2019-07-29 MED ORDER — METHYLENE BLUE 0.5 % INJ SOLN
INTRAVENOUS | Status: AC
  Filled 2019-07-29: qty 10

## 2019-07-29 MED ORDER — PROMETHAZINE HCL 25 MG/ML IJ SOLN: 6.2500 mg | INTRAMUSCULAR | Status: DC | PRN

## 2019-07-29 MED ORDER — LACTATED RINGERS IV SOLN: INTRAVENOUS | Status: DC

## 2019-07-29 MED ORDER — PROPOFOL 10 MG/ML IV BOLUS
INTRAVENOUS | Status: AC
  Filled 2019-07-29: qty 20

## 2019-07-29 MED ORDER — PROPOFOL 10 MG/ML IV BOLUS
INTRAVENOUS | Status: DC | PRN
  Administered 2019-07-29: 30 mg via INTRAVENOUS
  Administered 2019-07-29: 150 mg via INTRAVENOUS

## 2019-07-29 MED ORDER — BUPIVACAINE LIPOSOME 1.3 % IJ SUSP
INTRAMUSCULAR | Status: DC | PRN
  Administered 2019-07-29: 10 mL via PERINEURAL

## 2019-07-29 MED ORDER — CEFAZOLIN SODIUM-DEXTROSE 2-4 GM/100ML-% IV SOLN
2.0000 g | INTRAVENOUS | Status: AC
  Administered 2019-07-29: 2 g via INTRAVENOUS
  Filled 2019-07-29: qty 100

## 2019-07-29 MED ORDER — EPHEDRINE 5 MG/ML INJ
INTRAVENOUS | Status: AC
  Filled 2019-07-29: qty 10

## 2019-07-29 MED ORDER — LIDOCAINE-EPINEPHRINE 1 %-1:100000 IJ SOLN
INTRAMUSCULAR | Status: AC
  Filled 2019-07-29: qty 1

## 2019-07-29 MED ORDER — FENTANYL CITRATE (PF) 100 MCG/2ML IJ SOLN
INTRAMUSCULAR | Status: DC | PRN
  Administered 2019-07-29: 25 ug via INTRAVENOUS
  Administered 2019-07-29: 50 ug via INTRAVENOUS
  Administered 2019-07-29: 25 ug via INTRAVENOUS
  Administered 2019-07-29: 50 ug via INTRAVENOUS

## 2019-07-29 MED ORDER — ACETAMINOPHEN 500 MG PO TABS
1000.0000 mg | ORAL_TABLET | ORAL | Status: AC
  Administered 2019-07-29: 1000 mg via ORAL
  Filled 2019-07-29: qty 2

## 2019-07-29 MED ORDER — FENTANYL CITRATE (PF) 100 MCG/2ML IJ SOLN
INTRAMUSCULAR | Status: AC
  Administered 2019-07-29: 50 ug via INTRAVENOUS
  Filled 2019-07-29: qty 2

## 2019-07-29 MED ORDER — DEXAMETHASONE SODIUM PHOSPHATE 4 MG/ML IJ SOLN
INTRAMUSCULAR | Status: DC | PRN
  Administered 2019-07-29: 5 mg via INTRAVENOUS

## 2019-07-29 MED ORDER — ONDANSETRON HCL 4 MG/2ML IJ SOLN
INTRAMUSCULAR | Status: DC | PRN
  Administered 2019-07-29: 4 mg via INTRAVENOUS

## 2019-07-29 MED ORDER — LIDOCAINE HCL 1 % IJ SOLN
INTRAMUSCULAR | Status: DC | PRN
  Administered 2019-07-29: 40 mL via INTRAMUSCULAR

## 2019-07-29 MED ORDER — FENTANYL CITRATE (PF) 100 MCG/2ML IJ SOLN: 50.0000 ug | Freq: Once | INTRAMUSCULAR | Status: AC

## 2019-07-29 MED ORDER — MIDAZOLAM HCL 2 MG/2ML IJ SOLN
INTRAMUSCULAR | Status: AC
  Administered 2019-07-29: 1 mg via INTRAVENOUS
  Filled 2019-07-29: qty 2

## 2019-07-29 MED ORDER — OXYCODONE HCL 5 MG PO TABS: 5.0000 mg | ORAL_TABLET | Freq: Four times a day (QID) | ORAL | 0 refills | Status: DC | PRN

## 2019-07-29 MED ORDER — FENTANYL CITRATE (PF) 250 MCG/5ML IJ SOLN
INTRAMUSCULAR | Status: AC
  Filled 2019-07-29: qty 5

## 2019-07-29 MED ORDER — LIDOCAINE 2% (20 MG/ML) 5 ML SYRINGE
INTRAMUSCULAR | Status: DC | PRN
  Administered 2019-07-29: 20 mg via INTRAVENOUS

## 2019-07-29 MED ORDER — KETOROLAC TROMETHAMINE 15 MG/ML IJ SOLN: 15.0000 mg | Freq: Once | INTRAMUSCULAR | Status: DC | PRN

## 2019-07-29 MED ORDER — MIDAZOLAM HCL 2 MG/2ML IJ SOLN: 1.0000 mg | Freq: Once | INTRAMUSCULAR | Status: AC

## 2019-07-29 MED ORDER — ENSURE PRE-SURGERY PO LIQD: 296.0000 mL | Freq: Once | ORAL | Status: DC

## 2019-07-29 MED ORDER — TECHNETIUM TC 99M SULFUR COLLOID FILTERED
1.0000 | Freq: Once | INTRAVENOUS | Status: AC | PRN
  Administered 2019-07-29: 1 via INTRADERMAL

## 2019-07-29 SURGICAL SUPPLY — 61 items
ADH SKN CLS APL DERMABOND .7 (GAUZE/BANDAGES/DRESSINGS) ×4
APL PRP STRL LF DISP 70% ISPRP (MISCELLANEOUS) ×2
APPLIER CLIP 9.375 MED OPEN (MISCELLANEOUS)
APR CLP MED 9.3 20 MLT OPN (MISCELLANEOUS)
BINDER BREAST LRG (GAUZE/BANDAGES/DRESSINGS) ×1 IMPLANT
BINDER BREAST XLRG (GAUZE/BANDAGES/DRESSINGS) IMPLANT
BLADE SURG 10 STRL SS (BLADE) ×3 IMPLANT
BNDG COHESIVE 4X5 TAN STRL (GAUZE/BANDAGES/DRESSINGS) ×3 IMPLANT
CANISTER SUCT 3000ML PPV (MISCELLANEOUS) ×3 IMPLANT
CHLORAPREP W/TINT 26 (MISCELLANEOUS) ×3 IMPLANT
CLIP APPLIE 9.375 MED OPEN (MISCELLANEOUS) IMPLANT
CLIP VESOCCLUDE LG 6/CT (CLIP) ×4 IMPLANT
CLIP VESOCCLUDE MED 6/CT (CLIP) ×3 IMPLANT
CLIP VESOCCLUDE SM WIDE 6/CT (CLIP) ×3 IMPLANT
CNTNR URN SCR LID CUP LEK RST (MISCELLANEOUS) IMPLANT
CONT SPEC 4OZ STRL OR WHT (MISCELLANEOUS)
COVER PROBE W GEL 5X96 (DRAPES) ×3 IMPLANT
COVER SURGICAL LIGHT HANDLE (MISCELLANEOUS) ×3 IMPLANT
COVER WAND RF STERILE (DRAPES) ×3 IMPLANT
DERMABOND ADVANCED (GAUZE/BANDAGES/DRESSINGS) ×2
DERMABOND ADVANCED .7 DNX12 (GAUZE/BANDAGES/DRESSINGS) ×2 IMPLANT
DEVICE DUBIN SPECIMEN MAMMOGRA (MISCELLANEOUS) ×3 IMPLANT
DRAPE CHEST BREAST 15X10 FENES (DRAPES) ×3 IMPLANT
DRSG PAD ABDOMINAL 8X10 ST (GAUZE/BANDAGES/DRESSINGS) ×3 IMPLANT
ELECT CAUTERY BLADE 6.4 (BLADE) ×3 IMPLANT
ELECT COATED BLADE 2.86 ST (ELECTRODE) ×3 IMPLANT
ELECT NDL BLADE 2-5/6 (NEEDLE) ×2 IMPLANT
ELECT NEEDLE BLADE 2-5/6 (NEEDLE) ×3 IMPLANT
ELECT REM PT RETURN 9FT ADLT (ELECTROSURGICAL) ×3
ELECTRODE REM PT RTRN 9FT ADLT (ELECTROSURGICAL) ×2 IMPLANT
GLOVE BIO SURGEON STRL SZ 6 (GLOVE) ×3 IMPLANT
GLOVE INDICATOR 6.5 STRL GRN (GLOVE) ×3 IMPLANT
GOWN STRL REUS W/ TWL LRG LVL3 (GOWN DISPOSABLE) ×2 IMPLANT
GOWN STRL REUS W/TWL 2XL LVL3 (GOWN DISPOSABLE) ×3 IMPLANT
GOWN STRL REUS W/TWL LRG LVL3 (GOWN DISPOSABLE) ×3
ILLUMINATOR WAVEGUIDE N/F (MISCELLANEOUS) IMPLANT
KIT BASIN OR (CUSTOM PROCEDURE TRAY) ×3 IMPLANT
KIT MARKER MARGIN INK (KITS) ×3 IMPLANT
LIGHT WAVEGUIDE WIDE FLAT (MISCELLANEOUS) IMPLANT
NDL 18GX1X1/2 (RX/OR ONLY) (NEEDLE) IMPLANT
NDL FILTER BLUNT 18X1 1/2 (NEEDLE) IMPLANT
NDL HYPO 25GX1X1/2 BEV (NEEDLE) ×2 IMPLANT
NEEDLE 18GX1X1/2 (RX/OR ONLY) (NEEDLE) IMPLANT
NEEDLE FILTER BLUNT 18X 1/2SAF (NEEDLE)
NEEDLE FILTER BLUNT 18X1 1/2 (NEEDLE) IMPLANT
NEEDLE HYPO 25GX1X1/2 BEV (NEEDLE) ×3 IMPLANT
NS IRRIG 1000ML POUR BTL (IV SOLUTION) ×3 IMPLANT
PACK GENERAL/GYN (CUSTOM PROCEDURE TRAY) ×3 IMPLANT
PACK UNIVERSAL I (CUSTOM PROCEDURE TRAY) ×3 IMPLANT
PAD ABD 8X10 STRL (GAUZE/BANDAGES/DRESSINGS) ×2 IMPLANT
STOCKINETTE IMPERVIOUS 9X36 MD (GAUZE/BANDAGES/DRESSINGS) ×3 IMPLANT
STRIP CLOSURE SKIN 1/2X4 (GAUZE/BANDAGES/DRESSINGS) ×3 IMPLANT
SUT MNCRL AB 4-0 PS2 18 (SUTURE) ×3 IMPLANT
SUT VIC AB 2-0 SH 27 (SUTURE) ×3
SUT VIC AB 2-0 SH 27XBRD (SUTURE) ×2 IMPLANT
SUT VIC AB 3-0 SH 27 (SUTURE) ×3
SUT VIC AB 3-0 SH 27X BRD (SUTURE) ×2 IMPLANT
SUT VIC AB 3-0 SH 8-18 (SUTURE) ×3 IMPLANT
SYR CONTROL 10ML LL (SYRINGE) ×3 IMPLANT
TOWEL GREEN STERILE (TOWEL DISPOSABLE) ×3 IMPLANT
TOWEL GREEN STERILE FF (TOWEL DISPOSABLE) ×3 IMPLANT

## 2019-07-29 NOTE — Interval H&P Note (Signed)
History and Physical Interval Note:  07/29/2019 9:37 AM  Julie Rivera  has presented today for surgery, with the diagnosis of LEFT BREAST CANCER, RIGHT ABNORMAL MAMMOGRAM.  The various methods of treatment have been discussed with the patient and family. After consideration of risks, benefits and other options for treatment, the patient has consented to  Procedure(s) with comments: LEFT BREAST LUMPECTOMY WITH RADIOACTIVE SEED AND SENTINEL LYMPH NODE BIOPSY (Left) - PEC BLOCK RIGHT RADIOACTIVE SEED GUIDED EXCISIONAL BREAST BIOPSY (Right) - PEC BLOCK as a surgical intervention.  The patient's history has been reviewed, patient examined, no change in status, stable for surgery.  I have reviewed the patient's chart and labs.  Questions were answered to the patient's satisfaction.     Stark Klein

## 2019-07-29 NOTE — Anesthesia Procedure Notes (Signed)
Anesthesia Regional Block: Pectoralis block   Pre-Anesthetic Checklist: ,, timeout performed, Correct Patient, Correct Site, Correct Laterality, Correct Procedure, Correct Position, site marked, Risks and benefits discussed,  Surgical consent,  Pre-op evaluation,  At surgeon's request and post-op pain management  Laterality: Left  Prep: Maximum Sterile Barrier Precautions used, chloraprep       Needles:  Injection technique: Single-shot  Needle Type: Echogenic Stimulator Needle     Needle Length: 9cm  Needle Gauge: 22     Additional Needles:   Procedures:,,,, ultrasound used (permanent image in chart),,,,  Narrative:  Start time: 07/29/2019 9:55 AM End time: 07/29/2019 10:02 AM Injection made incrementally with aspirations every 5 mL.  Performed by: Personally  Anesthesiologist: Pervis Hocking, DO  Additional Notes: Monitors applied. No increased pain on injection. No increased resistance to injection. Injection made in 5cc increments. Good needle visualization. Patient tolerated procedure well.

## 2019-07-29 NOTE — Anesthesia Postprocedure Evaluation (Signed)
Anesthesia Post Note  Patient: Julie Rivera  Procedure(s) Performed: LEFT BREAST LUMPECTOMY WITH RADIOACTIVE SEED AND SENTINEL LYMPH NODE BIOPSY (Left Breast) RIGHT RADIOACTIVE SEED GUIDED EXCISIONAL BREAST BIOPSY (Right Breast)     Patient location during evaluation: PACU Anesthesia Type: Regional and General Level of consciousness: awake and alert, oriented and patient cooperative Pain management: pain level controlled Vital Signs Assessment: post-procedure vital signs reviewed and stable Respiratory status: spontaneous breathing, nonlabored ventilation and respiratory function stable Cardiovascular status: blood pressure returned to baseline and stable Postop Assessment: no apparent nausea or vomiting Anesthetic complications: no    Last Vitals:  Vitals:   07/29/19 1334 07/29/19 1353  BP: (!) 153/77 (!) 165/75  Pulse: 72 72  Resp: (!) 9 16  Temp:  (!) 36.3 C  SpO2: 96% 99%    Last Pain:  Vitals:   07/29/19 1353  TempSrc:   PainSc: 0-No pain                 Pervis Hocking

## 2019-07-29 NOTE — Transfer of Care (Signed)
Immediate Anesthesia Transfer of Care Note  Patient: Julie Rivera  Procedure(s) Performed: LEFT BREAST LUMPECTOMY WITH RADIOACTIVE SEED AND SENTINEL LYMPH NODE BIOPSY (Left Breast) RIGHT RADIOACTIVE SEED GUIDED EXCISIONAL BREAST BIOPSY (Right Breast)  Patient Location: PACU  Anesthesia Type:General and Regional  Level of Consciousness: awake, alert , oriented and drowsy  Airway & Oxygen Therapy: Patient Spontanous Breathing and Patient connected to nasal cannula oxygen  Post-op Assessment: Report given to RN and Post -op Vital signs reviewed and stable  Post vital signs: Reviewed and stable  Last Vitals:  Vitals Value Taken Time  BP 161/78 07/29/19 1320  Temp    Pulse 77 07/29/19 1322  Resp 14 07/29/19 1321  SpO2 100 % 07/29/19 1322  Vitals shown include unvalidated device data.  Last Pain:  Vitals:   07/29/19 1003  TempSrc:   PainSc: 0-No pain      Patients Stated Pain Goal: 3 ([id number])  Complications: No apparent anesthesia complications

## 2019-07-29 NOTE — Discharge Instructions (Addendum)
[facility] °Office Phone Number [phone] ° °BREAST BIOPSY/ PARTIAL MASTECTOMY: POST OP INSTRUCTIONS ° °Always review your discharge instruction sheet given to you by the facility where your surgery was performed. ° °IF YOU HAVE DISABILITY OR FAMILY LEAVE FORMS, YOU MUST BRING THEM TO THE OFFICE FOR PROCESSING.  DO NOT GIVE THEM TO YOUR DOCTOR. ° °1. A prescription for pain medication may be given to you upon discharge.  Take your pain medication as prescribed, if needed.  If narcotic pain medicine is not needed, then you may take acetaminophen (Tylenol) or ibuprofen (Advil) as needed. °2. Take your usually prescribed medications unless otherwise directed °3. If you need a refill on your pain medication, please contact your pharmacy.  They will contact our office to request authorization.  Prescriptions will not be filled after 5pm or on week-ends. °4. You should eat very light the first 24 hours after surgery, such as soup, crackers, pudding, etc.  Resume your normal diet the day after surgery. °5. Most patients will experience some swelling and bruising in the breast.  Ice packs and a good support bra will help.  Swelling and bruising can take several days to resolve.  °6. It is common to experience some constipation if taking pain medication after surgery.  Increasing fluid intake and taking a stool softener will usually help or prevent this problem from occurring.  A mild laxative (Milk of Magnesia or Miralax) should be taken according to package directions if there are no bowel movements after 48 hours. °7. Unless discharge instructions indicate otherwise, you may remove your bandages 48 hours after surgery, and you may shower at that time.  You may have steri-strips (small skin tapes) in place directly over the incision.  These strips should be left on the skin for 7-10 days.   Any sutures or staples will be removed at the office during your follow-up visit. °8. ACTIVITIES:  You may resume  regular daily activities (gradually increasing) beginning the next day.  Wearing a good support bra or sports bra (or the breast binder) minimizes pain and swelling.  You may have sexual intercourse when it is comfortable. °a. You may drive when you no longer are taking prescription pain medication, you can comfortably wear a seatbelt, and you can safely maneuver your car and apply brakes. °b. RETURN TO WORK:  __________1 week_______________ °9. You should see your doctor in the office for a follow-up appointment approximately two weeks after your surgery.  Your doctor’s nurse will typically make your follow-up appointment when she calls you with your pathology report.  Expect your pathology report 2-3 business days after your surgery.  You may call to check if you do not hear from us after three days. ° ° °WHEN TO CALL YOUR DOCTOR: °1. Fever over 101.0 °2. Nausea and/or vomiting. °3. Extreme swelling or bruising. °4. Continued bleeding from incision. °5. Increased pain, redness, or drainage from the incision. ° °The clinic staff is available to answer your questions during regular business hours.  Please don’t hesitate to call and ask to speak to one of the nurses for clinical concerns.  If you have a medical emergency, go to the nearest emergency room or call 911.  A surgeon from [facility] is always on call at the hospital. ° °For further questions, please visit [email or web address]  ° °

## 2019-07-29 NOTE — Op Note (Signed)
Right breast seed localized excisional biopsy, left Breast Radioactive seed localized lumpectomy and sentinel lymph node biopsy  Indications: This patient presents with history of abnormal right mammogram and left breast cancer, lower outer quadrant, grade 2 invasive ductal carcinoma, ER/PR +, Her 2 -, [id number]  Pre-operative Diagnosis: as above  Post-operative Diagnosis: Same  Surgeon: Stark Klein   Anesthesia: General endotracheal anesthesia  ASA Class: 2  Procedure Details  The patient was seen in the Holding Room. The risks, benefits, complications, treatment options, and expected outcomes were discussed with the patient. The possibilities of bleeding, infection, the need for additional procedures, failure to diagnose a condition, and creating a complication requiring transfusion or operation were discussed with the patient. The patient concurred with the proposed plan, giving informed consent.  The site of surgery properly noted/marked. The patient was taken to Operating Room # 9, identified, and the procedure verified as Right breast seed localized excisional breast biopsy and left Breast seed localized Lumpectomy with sentinel lymph node biopsy. A Time Out was held and the above information confirmed.  The left arm and bilateral breast and chest were prepped and draped in standard fashion. The right side was addressed first.    The seed was located and an inferior circumareolar incision was made with the #10 blade.  The cautery was used to dissect the breast tissue.  The portion with the seed was elevated with an Allis clamp.  The cautery was used to go under the point of maximum signal intensity.  This was taken down to the pectoralis fascia.  The incision was closed with interrupted 3-0 vicryl deep dermal sutures and running 4-0 monocryl subcuticular suture.    The left side was then addressed. The lumpectomy was performed by creating an inferolateral circumareolar incision near the  previously placed radioactive seed.  Dissection was carried down to around the point of maximum signal intensity. The cautery was used to perform the dissection.  Hemostasis was achieved with cautery. The edges of the cavity were marked with three large clips.   The specimen was inked with the margin marker paint kit.    Specimen radiography confirmed inclusion of the mammographic lesion, the clip, and the seed.  The background signal in the breast was zero.  The wound was irrigated and closed with 3-0 vicryl in layers and 4-0 monocryl subcuticular suture.    Using a hand-held gamma probe, left axillary sentinel nodes were identified transcutaneously.  An oblique incision was created below the axillary hairline.  Dissection was carried through the clavipectoral fascia.  Two deep level 2 axillary sentinel nodes were removed.  Counts per second were 197 and 75.    The background count was 0 cps.  The wound was irrigated.  Hemostasis was achieved with cautery.  The axillary incision was closed with a 3-0 vicryl deep dermal interrupted sutures and a 4-0 monocryl subcuticular closure.    Sterile dressings were applied. At the end of the operation, all sponge, instrument, and needle counts were correct.  Findings: grossly clear surgical margins and no adenopathy.  Right side: posterior margin is pec and anterior margin is skin.  Left side:  Anterior, lateral, and inferior margins are skin.  Posterior margin is pectoralis.    Estimated Blood Loss:  min         Specimens: right breast tissue with seed, left breast lumpectomy with seed, and two left axillary sentinel lymph nodes.             Complications:  None; patient tolerated the procedure well.         Disposition: PACU - hemodynamically stable.         Condition: stable

## 2019-07-29 NOTE — Anesthesia Procedure Notes (Addendum)
Procedure Name: LMA Insertion Date/Time: 07/29/2019 11:23 AM Performed by: Trinna Post., CRNA Pre-anesthesia Checklist: Patient identified, Emergency Drugs available, Suction available, Patient being monitored and Timeout performed Patient Re-evaluated:Patient Re-evaluated prior to induction Oxygen Delivery Method: Circle system utilized Preoxygenation: Pre-oxygenation with 100% oxygen Induction Type: IV induction Ventilation: Mask ventilation without difficulty LMA: LMA inserted LMA Size: 4.0 Number of attempts: 1 Placement Confirmation: positive ETCO2 and breath sounds checked- equal and bilateral Tube secured with: Tape Dental Injury: Teeth and Oropharynx as per pre-operative assessment  Comments: Inserted by Clyde Lundborg

## 2019-07-29 NOTE — Anesthesia Preprocedure Evaluation (Signed)
Anesthesia Evaluation  Patient identified by MRN, date of birth, ID band Patient awake    Reviewed: Allergy & Precautions, NPO status , Patient's Chart, lab work & pertinent test results  Airway Mallampati: II  TM Distance: >3 FB Neck ROM: Full    Dental no notable dental hx.    Pulmonary neg pulmonary ROS,    Pulmonary exam normal breath sounds clear to auscultation       Cardiovascular negative cardio ROS Normal cardiovascular exam Rhythm:Regular Rate:Normal     Neuro/Psych negative neurological ROS  negative psych ROS   GI/Hepatic negative GI ROS, Neg liver ROS,   Endo/Other  negative endocrine ROS  Renal/GU negative Renal ROS  negative genitourinary   Musculoskeletal  (+) Arthritis , Osteoarthritis,    Abdominal   Peds negative pediatric ROS (+)  Hematology negative hematology ROS (+) hct 43.6   Anesthesia Other Findings Left breast ca, abnormal R mammogram   Reproductive/Obstetrics negative OB ROS                             Anesthesia Physical Anesthesia Plan  ASA: II  Anesthesia Plan: General and Regional   Post-op Pain Management: GA combined w/ Regional for post-op pain   Induction: Intravenous  PONV Risk Score and Plan: 3 and Ondansetron, Dexamethasone, Midazolam and Treatment may vary due to age or medical condition  Airway Management Planned: LMA  Additional Equipment: None  Intra-op Plan:   Post-operative Plan: Extubation in OR  Informed Consent: I have reviewed the patients History and Physical, chart, labs and discussed the procedure including the risks, benefits and alternatives for the proposed anesthesia with the patient or authorized representative who has indicated his/her understanding and acceptance.     Dental advisory given  Plan Discussed with: CRNA  Anesthesia Plan Comments:         Anesthesia Quick Evaluation

## 2019-07-29 NOTE — H&P (Signed)
Julie Rivera Location: [facility] Patient #: [id number] DOB: 11/23/1945 Undefined / Language: Cleophus Molt / Race: White Female   History of Present Illness  The patient is a 74 year old female who presents with breast cancer. Pt is a lovely 74 yo F who is referred by Dr. Isaiah Blakes for a new diagnosis of left breast cancer 05/2019. She had screening detected calcifications and a mass on the left. She says retrospectively, she has a mass for 1-2 months, but it was in the same place that she had a breast cyst before and she thought that is what was going on. She then got diagnostic imaging which showed a 1.9 cm "highly irregular" mass in the upper outer quadrant in a 4.5 cm area of calcifications. Core needle biopsy of the MASS was performed and showed a grade 2 invasive ductal carcinoma that was ER/PR positive and her 2 negative wtih Ki 67 5%. Breast density is category C.   Of note, she lives in [place], but has know Dr. Isaiah Blakes for a long time and comes to see her for her mammograms. She has 2 children and is retired. She plays a lot of tennis. menarche was age 21. She used HRT. She had first child at age 81. She used OCPs for 33 years approximately. She is up to date with colonoscopy and bone density study. She has no family history of cancer, and the only personal history of cancer is basal cell on her nose.   Images and reports are reviewed today in multidiscliplinary fashion.   pathology 05/19/2019 invasive ductal carcinoma, grade 2 ER/PR positive Her 2 negative Ki 67 5%  Labs 05/26/2019 CBC, CMET essentially normal   Past Surgical History Breast Biopsy  Left. Foot Surgery  Bilateral. Knee Surgery  Left. Shoulder Surgery  Right.  Diagnostic Studies History Colonoscopy  1-5 years ago Mammogram  within last year Pap Smear  >5 years ago  Medication History  Medications Reconciled  Social History Caffeine use  Carbonated beverages, Tea. No  alcohol use  No drug use  Tobacco use  Never smoker.  Pregnancy / Birth History  Age at menarche  80 years. Age of menopause  [age] Contraceptive History  Oral contraceptives. Gravida  2 Maternal age  24-30 Para  2 Regular periods   Other Problems Arthritis  Breast Cancer  Cancer     Review of Systems  General Not Present- Appetite Loss, Chills, Fatigue, Fever, Night Sweats, Weight Gain and Weight Loss. Skin Not Present- Change in Wart/Mole, Dryness, Hives, Jaundice, New Lesions, Non-Healing Wounds, Rash and Ulcer. HEENT Not Present- Earache, Hearing Loss, Hoarseness, Nose Bleed, Oral Ulcers, Ringing in the Ears, Seasonal Allergies, Sinus Pain, Sore Throat, Visual Disturbances, Wears glasses/contact lenses and Yellow Eyes. Respiratory Not Present- Bloody sputum, Chronic Cough, Difficulty Breathing, Snoring and Wheezing. Breast Not Present- Breast Mass, Breast Pain, Nipple Discharge and Skin Changes. Cardiovascular Not Present- Chest Pain, Difficulty Breathing Lying Down, Leg Cramps, Palpitations, Rapid Heart Rate, Shortness of Breath and Swelling of Extremities. Gastrointestinal Not Present- Abdominal Pain, Bloating, Bloody Stool, Change in Bowel Habits, Chronic diarrhea, Constipation, Difficulty Swallowing, Excessive gas, Gets full quickly at meals, Hemorrhoids, Indigestion, Nausea, Rectal Pain and Vomiting. Female Genitourinary Not Present- Frequency, Nocturia, Painful Urination, Pelvic Pain and Urgency. Neurological Not Present- Decreased Memory, Fainting, Headaches, Numbness, Seizures, Tingling, Tremor, Trouble walking and Weakness. Psychiatric Not Present- Anxiety, Bipolar, Change in Sleep Pattern, Depression, Fearful and Frequent crying. Endocrine Not Present- Cold Intolerance, Excessive Hunger, Hair Changes, Heat Intolerance,  Hot flashes and New Diabetes. Hematology Present- Easy Bruising. Not Present- Blood Thinners, Excessive bleeding, Gland problems, HIV and  Persistent Infections.  Vitals  Weight: 140.4 lb Temp.: 98.44F  Pulse: 70 (Regular)  Resp.: 18 (Unlabored)  BP: 142/78 (Sitting, Left Arm, Standard)       Physical Exam  General Mental Status-Alert. General Appearance-Consistent with stated age. Hydration-Well hydrated. Voice-Normal.  Head and Neck Head-normocephalic, atraumatic with no lesions or palpable masses. Trachea-midline. Thyroid Gland Characteristics - normal size and consistency.  Eye Eyeball - Bilateral-Extraocular movements intact. Sclera/Conjunctiva - Bilateral-No scleral icterus.  Chest and Lung Exam Chest and lung exam reveals -quiet, even and easy respiratory effort with no use of accessory muscles and on auscultation, normal breath sounds, no adventitious sounds and normal vocal resonance. Inspection Chest Wall - Normal. Back - normal.  Breast Note: breasts are relatively symmetric. grade 1-2 ptosis. Heterogeneously dense. bruising laterally on left breast with palpable 2 cm mass at 3 o'clock. mild tenderness. no LAD appreciated. no nipple retraction or nipple discharge. no skin dimpling.   Cardiovascular Cardiovascular examination reveals -normal heart sounds, regular rate and rhythm with no murmurs and normal pedal pulses bilaterally.  Abdomen Inspection Inspection of the abdomen reveals - No Hernias. Palpation/Percussion Palpation and Percussion of the abdomen reveal - Soft, Non Tender, No Rebound tenderness, No Rigidity (guarding) and No hepatosplenomegaly. Auscultation Auscultation of the abdomen reveals - Bowel sounds normal.  Neurologic Neurologic evaluation reveals -alert and oriented x 3 with no impairment of recent or remote memory. Mental Status-Normal.  Musculoskeletal Global Assessment -Note: no gross deformities.  Normal Exam - Left-Upper Extremity Strength Normal and Lower Extremity Strength Normal. Normal Exam - Right-Upper Extremity  Strength Normal and Lower Extremity Strength Normal.  Lymphatic Head & Neck  General Head & Neck Lymphatics: Bilateral - Description - Normal. Axillary  General Axillary Region: Bilateral - Description - Normal. Tenderness - Non Tender. Femoral & Inguinal  Generalized Femoral & Inguinal Lymphatics: Bilateral - Description - No Generalized lymphadenopathy.    Assessment & Plan MALIGNANT NEOPLASM OF UPPER-OUTER QUADRANT OF LEFT BREAST IN FEMALE, ESTROGEN RECEPTOR POSITIVE (C50.412) Impression: Pt has a new diagnosis of left breast cancer cT1cN0. She also has around 4.5 cm of calcifications that have not yet been biopsied. We will get MRI given breast density and to evaluate for extent of disease. She will need at least one additional biopsy of the calcs to determine if they are benign, malignant, or discordant.  We will likely do lumpectomy (if calcs negative) or seed bracketed lumpectomy (if calcs are positive) wtih sentinel node biopsy. I discussed this wtih the patient. If we have MRI findings and additional biopsies that require mastectomy, I will see her back and refer her to plastic surgery. Her main goal which is also our goal is for her to look similar in clothing.  The surgical procedure was described to the patient. I discussed the incision type and location and that we would need radiology involved on with a wire or seed marker and/or sentinel node.  The risks and benefits of the procedure were described to the patient and she wishes to proceed.  We discussed the risks bleeding, infection, damage to other structures, need for further procedures/surgeries. We discussed the risk of seroma. The patient was advised if the area in the breast in cancer, we may need to go back to surgery for additional tissue to obtain negative margins or for a lymph node biopsy. The patient was advised that these are the  most common complications, but that others can occur as well. They were advised  against taking aspirin or other anti-inflammatory agents/blood thinners the week before surgery. Current Plans MRI BREAST BILATERAL WO THEN W CONTRAST (77049)(ACR 5 )(DSN [id number])(G-Code G1004(ME)) (breast cancer, dense breasts evaluate extent of disease for equivocal mammogram/us findings.) Pt Education - flb breast cancer surgery: discussed with patient and provided information.

## 2019-08-04 NOTE — Progress Notes (Signed)
Patient Care Team: Burnard Hawthorne, FNP as PCP - General (Family Medicine) Mauro Kaufmann, RN as Oncology Nurse Navigator Rockwell Germany, RN as Oncology Nurse Navigator Stark Klein, MD as Consulting Physician (General Surgery) Nicholas Lose, MD as Consulting Physician (Hematology and Oncology) Gery Pray, MD as Consulting Physician (Radiation Oncology)  DIAGNOSIS:    ICD-10-CM   1. Malignant neoplasm of upper-outer quadrant of left breast in female, estrogen receptor positive ([place])  C50.412    Z17.0     SUMMARY OF ONCOLOGIC HISTORY: Oncology History  Malignant neoplasm of upper-outer quadrant of left breast in female, estrogen receptor positive ([place])  05/21/2019 Initial Diagnosis   Screening mammogram detected an asymmetry and calcifications 4.5 cm in the left breast. Diagnostic mammogram showed a 1.9cm mass and calcifications in the upper outer left breast.  Biopsy revealed grade 2 IDC ER/PR positive HER-2 negative with a Ki-67 of 5%   07/29/2019 Surgery   Left lumpectomy [facility]): IDC, grade 1, 2.4cm, with low grade DCIS, 2 left axillary lymph nodes negative.     CHIEF COMPLIANT: Follow-up of left breast cancer s/p lumpectomy   INTERVAL HISTORY: Julie Rivera is a 74 y.o. with above-mentioned history of left breast cancer. She underwent a left lumpectomy on 07/29/19 with Dr. Barry Dienes for which pathology showed invasive ductal carcinoma, grade 1, 2.4cm, with low grade DCIS, 2 left axillary lymph nodes negative for carcinoma. She presents to the clinic today to discuss the pathology report and further treatment.   ALLERGIES:  has No Known Allergies.  PHYSICAL EXAMINATION: ECOG PERFORMANCE STATUS: 1 - Symptomatic but completely ambulatory  Vitals:   08/05/19 1350  BP: 130/73  Pulse: 83  Resp: 17  Temp: 98.7 F (37.1 C)  SpO2: 97%   Filed Weights   08/05/19 1350  Weight: 140 lb (63.5 kg)    LABORATORY DATA:  I have reviewed the data as listed CMP Latest  Ref Rng & Units 05/26/2019 01/12/2018 11/10/2017  Glucose 70 - 99 mg/dL 62(L) 76 -  BUN 8 - 23 mg/dL 15 16 -  Creatinine 0.44 - 1.00 mg/dL 0.77 0.71 0.80  Sodium 135 - 145 mmol/L 143 139 -  Potassium 3.5 - 5.1 mmol/L 3.8 4.5 -  Chloride 98 - 111 mmol/L 107 104 -  CO2 22 - 32 mmol/L 28 29 -  Calcium 8.9 - 10.3 mg/dL 9.5 9.8 -  Total Protein 6.5 - 8.1 g/dL 6.8 6.6 -  Total Bilirubin 0.3 - 1.2 mg/dL 0.5 0.5 -  Alkaline Phos 38 - 126 U/L 59 46 -  AST 15 - 41 U/L 16 15 -  ALT 0 - 44 U/L 13 9 -    Lab Results  Component Value Date   WBC 4.1 07/26/2019   HGB 14.2 07/26/2019   HCT 43.6 07/26/2019   MCV 92.4 07/26/2019   PLT 256 07/26/2019   NEUTROABS 2.3 05/26/2019    ASSESSMENT & PLAN:  Malignant neoplasm of upper-outer quadrant of left breast in female, estrogen receptor positive ([place]) 05/21/2019: Screening mammogram detected an asymmetry and calcifications in the left breast calcifications span 4.5 cm the palpable lump inside of that measured 1.7 cm. Diagnostic mammogram showed a 1.9cm mass and calcifications in the upper outer left breast.  Biopsy revealed grade 2 invasive ductal carcinoma ER/PR positive HER-2 negative with a Ki-67 of 15%  07/29/2019:Left lumpectomy ([place]): IDC, grade 1, 2.4cm, with low grade DCIS, 2 left axillary lymph nodes negative.  ER/PR positive HER-2 negative with a Ki-67  of 15% positive anterior and inferior margins  Treatment plan: Resection of the positive margins 1.  Adjuvant radiation therapy 2.  Followed by adjuvant antiestrogen therapy. Patient is not interested in chemotherapy and therefore Oncotype DX will not be obtained. I will discuss with Dr. Barry Dienes regarding the surgery results and determine resection of the margins.  Return to clinic at the end of radiation to start antiestrogens.  No orders of the defined types were placed in this encounter.  The patient has a good understanding of the overall plan. she agrees with it. she will call with any  problems that may develop before the next visit here.  Total time spent: 20 mins including face to face time and time spent for planning, charting and coordination of care  Nicholas Lose, MD 08/05/2019  I, Cloyde Reams Dorshimer, am acting as scribe for Dr. Nicholas Lose.  I have reviewed the above documentation for accuracy and completeness, and I agree with the above.

## 2019-08-05 ENCOUNTER — Inpatient Hospital Stay: Payer: Medicare Other | Attending: Hematology and Oncology | Admitting: Hematology and Oncology

## 2019-08-05 ENCOUNTER — Telehealth: Payer: Self-pay | Admitting: Hematology and Oncology

## 2019-08-05 ENCOUNTER — Other Ambulatory Visit: Payer: Self-pay

## 2019-08-05 ENCOUNTER — Encounter: Payer: Self-pay | Admitting: *Deleted

## 2019-08-05 DIAGNOSIS — Z170 Estrogen receptor positive status [ER+]: Secondary | ICD-10-CM | POA: Diagnosis not present

## 2019-08-05 DIAGNOSIS — C50412 Malignant neoplasm of upper-outer quadrant of left female breast: Secondary | ICD-10-CM | POA: Diagnosis not present

## 2019-08-05 NOTE — Telephone Encounter (Signed)
MarginI discussed with Dr. Barry Dienes the results of pathology and she clarified that the anterior and inferior margins do not have any further tissue to be removed. Therefore she is through with surgery.  She will keep her follow-up appointments with radiation oncology. I informed the patient of my discussion with Dr. Barry Dienes. I will see her at the end of radiation.

## 2019-08-05 NOTE — Assessment & Plan Note (Addendum)
05/21/2019: Screening mammogram detected an asymmetry and calcifications in the left breast calcifications span 4.5 cm the palpable lump inside of that measured 1.7 cm. Diagnostic mammogram showed a 1.9cm mass and calcifications in the upper outer left breast.  Biopsy revealed grade 2 invasive ductal carcinoma ER/PR positive HER-2 negative with a Ki-67 of 15%  07/29/2019:Left lumpectomy ([place]): IDC, grade 1, 2.4cm, with low grade DCIS, 2 left axillary lymph nodes negative.  ER/PR positive HER-2 negative with a Ki-67 of 15%  Treatment plan: 1.  Adjuvant radiation therapy 2.  Followed by adjuvant antiestrogen therapy. Patient is not interested in chemotherapy and therefore Oncotype DX will not be obtained.  Return to clinic at the end of radiation to start antiestrogens.

## 2019-08-11 LAB — SURGICAL PATHOLOGY

## 2019-08-18 NOTE — Progress Notes (Signed)
Error

## 2019-08-19 ENCOUNTER — Ambulatory Visit
Admission: RE | Admit: 2019-08-19 | Discharge: 2019-08-19 | Disposition: A | Discharge status: Home / Self Care | Payer: Medicare Other | Source: Ambulatory Visit | Attending: Radiation Oncology | Admitting: Radiation Oncology

## 2019-08-19 ENCOUNTER — Encounter: Payer: Self-pay | Admitting: Radiation Oncology

## 2019-08-19 ENCOUNTER — Other Ambulatory Visit: Payer: Self-pay

## 2019-08-19 ENCOUNTER — Encounter: Payer: Self-pay | Admitting: Physical Therapy

## 2019-08-19 ENCOUNTER — Ambulatory Visit: Payer: Medicare Other | Attending: General Surgery | Admitting: Physical Therapy

## 2019-08-19 DIAGNOSIS — Z483 Aftercare following surgery for neoplasm: Secondary | ICD-10-CM | POA: Diagnosis not present

## 2019-08-19 DIAGNOSIS — Z9889 Other specified postprocedural states: Secondary | ICD-10-CM | POA: Diagnosis not present

## 2019-08-19 DIAGNOSIS — D0512 Intraductal carcinoma in situ of left breast: Secondary | ICD-10-CM | POA: Diagnosis not present

## 2019-08-19 DIAGNOSIS — Z170 Estrogen receptor positive status [ER+]: Secondary | ICD-10-CM

## 2019-08-19 DIAGNOSIS — C50412 Malignant neoplasm of upper-outer quadrant of left female breast: Secondary | ICD-10-CM

## 2019-08-19 DIAGNOSIS — R293 Abnormal posture: Secondary | ICD-10-CM | POA: Insufficient documentation

## 2019-08-19 DIAGNOSIS — Z510 Encounter for antineoplastic radiation therapy: Secondary | ICD-10-CM | POA: Insufficient documentation

## 2019-08-19 NOTE — Progress Notes (Signed)
Radiation Oncology         [phone] ________________________________  Name: Julie Rivera MRN: [id number]  Date: 08/19/2019  DOB: 1945/07/18  Re-Evaluation Note  CC: Burnard Hawthorne, FNP  Burnard Hawthorne, FNP    ICD-10-CM   1. Malignant neoplasm of upper-outer quadrant of left breast in female, estrogen receptor positive ([place])  C50.412    Z17.0     Diagnosis: Stage IA (pT2, pN0), Left Breast UOQ, Invasive Ductal Carcinoma with low-grade DCIS, ER+ / PR+ / Her2-, Grade 1  Narrative: The patient returns today to discuss radiation treatment options. She was seen in the multidisciplinary breast clinic on 05/26/2019. At that time, it was recommended that the patient proceed with MRI, possible surgery, adjuvant radiation therapy with she was to have breast conserving surgery, and aromatase inhibitor.  MRI of bilateral breasts was limited due to marked background parenchymal enhancement bilaterally. However, it did show a 1.9 cm mass in the lateral left breast corresponding with the patient's biopsy-proven malignancy. There were no additional suspicious enhancements in the left breast. There was also an indeterminate clumped non-mass enhancement in the inferior central right breast, slightly greater than background. There was no suspicious lymphadenopathy.  The patient underwent right breast excisional biopsy and left breast lumpectomy with sentinel lymph node biopsy on 07/29/2019 performed by Dr. Barry Dienes. Pathology from the procedure revealed grade 1 invasive ductal carcinoma with low-grade ductal carcinoma in-situ of the left breast. The invasive carcinoma was broadly present at the anterior margin, focally present at the lateral margin, and focally present at the inferior margin. Perineural invasion was identified. The left additional lateral margin was positive for invasive ductal carcinoma, but the surgical resection margin was negative. There was no evidence of carcinoma in the two  left axillary sentinel lymph nodes that were biopsied. Finally, the right breast showed a radial scar, intraductal papilloma, fibrocystic changes, and usual ductal hyperplasia, but no evidence of malignancy.  There was a discussion concerning the surgical margins and Dr. Barry Dienes felt that additional surgery is not indicated. Per op note:  Left side:  Anterior, lateral, and inferior margins are skin.  Posterior margin is pectoralis.     The patient was seen by Dr. Lindi Adie on 08/05/2019, during which time it was recommended that the patient proceed with adjuvant radiation therapy followed by adjuvant antiestrogen therapy. The patient was not interested in chemotherapy, therefore Oncotype DX was not obtained.  On review of systems, the patient reports no complaints. She denies pain, lymphedema, and any other symptoms.    Allergies:  has No Known Allergies.  Meds: No current outpatient medications on file.   No current facility-administered medications for this encounter.    Physical Findings: The patient is in no acute distress. Patient is alert and oriented.  height is 5' 9" (1.753 m) and weight is 138 lb 6 oz (62.8 kg). Her temporal temperature is 98 F (36.7 C). Her blood pressure is 129/84 and her pulse is 73. Her respiration is 18 and oxygen saturation is 99%.  No significant changes. Lungs are clear to auscultation bilaterally. Heart has regular rate and rhythm. No palpable cervical, supraclavicular, or axillary adenopathy. Abdomen soft, non-tender, normal bowel sounds. Right breast: no palpable mass, nipple discharge or bleeding.  Left breast: Patient has a periareolar scar which is healing well.  She also has a separate scar in the axillary region from her sentinel node procedure which is also healing well.  No signs of infection within the breast no  nipple discharge or bleeding.  Lab Findings: Lab Results  Component Value Date   WBC 4.1 07/26/2019   HGB 14.2 07/26/2019   HCT 43.6  07/26/2019   MCV 92.4 07/26/2019   PLT 256 07/26/2019    Radiographic Findings: NM Sentinel Node Inj-No Rpt (Breast)  Result Date: 07/29/2019 Sulfur colloid was injected by the nuclear medicine technologist for melanoma sentinel node.    Impression: Stage IA (pT2, pN0), Left Breast UOQ, Invasive Ductal Carcinoma with low-grade DCIS, ER+ / PR+ / Her2-, Grade 1  Patient would be a good candidate for adjuvant radiation therapy as breast conserving treatment.  I discussed the general course of treatment side effects and potential toxicities of radiation therapy in the situation with the patient.  She appears to understand well and wishes to proceed with radiation therapy as planned.  Patient would be a good candidate for hypofractionated accelerated radiation therapy.  Plan: Patient is scheduled for CT simulation later today.  Treatments to begin on May 19.  Anticipate 4 weeks of radiation therapy.  Will use cardiac sparing techniques if necessary.  -----------------------------------  Blair Promise, PhD, MD  This document serves as a record of services personally performed by Gery Pray, MD. It was created on his behalf by Clerance Lav, a trained medical scribe. The creation of this record is based on the scribe's personal observations and the provider's statements to them. This document has been checked and approved by the attending provider.

## 2019-08-19 NOTE — Patient Instructions (Signed)
Closed Chain: Shoulder Abduction / Adduction - on Wall    One hand on wall, step to side and return. Stepping causes shoulder to abduct and adduct. Step _5__ times, holding 5 seconds, _2__ times per day.  http://ss.exer.us/267   Copyright  VHI. All rights reserved.

## 2019-08-19 NOTE — Therapy (Signed)
[place], [place], [address] Phone: [phone]   Fax:  [phone]  Physical Therapy Treatment  Patient Details  Name: Julie Rivera MRN: [id number] Date of Birth: 01/25/1946 Referring Provider (PT): Dr. Stark Klein   Encounter Date: 08/19/2019  PT End of Session - 08/19/19 1045    Visit Number  2    Number of Visits  2    PT Start Time  1005    PT Stop Time  1040    PT Time Calculation (min)  35 min    Activity Tolerance  Patient tolerated treatment well    Behavior During Therapy  [facility] for tasks assessed/performed       Past Medical History:  Diagnosis Date  . Actinic keratosis   . Allergy   . Basal cell carcinoma 03/22/2019   L nasal tip  . Basal cell carcinoma 03/26/2010   L lat upper chest, L chest infer clavicle  . Basal cell carcinoma 09/12/2009   L medial pretibial  . Chicken pox   . Kidney stones   . Squamous cell carcinoma of skin 04/22/2016   R ant thigh medial, R ant thigh lateral    Past Surgical History:  Procedure Laterality Date  . BACK SURGERY  2017  . BREAST LUMPECTOMY WITH RADIOACTIVE SEED AND SENTINEL LYMPH NODE BIOPSY Left 07/29/2019   Procedure: LEFT BREAST LUMPECTOMY WITH RADIOACTIVE SEED AND SENTINEL LYMPH NODE BIOPSY;  Surgeon: Stark Klein, MD;  Location: [place];  Service: General;  Laterality: Left;  PEC BLOCK  . KNEE ARTHROCENTESIS    . RADIOACTIVE SEED GUIDED EXCISIONAL BREAST BIOPSY Right 07/29/2019   Procedure: RIGHT RADIOACTIVE SEED GUIDED EXCISIONAL BREAST BIOPSY;  Surgeon: Stark Klein, MD;  Location: [place];  Service: General;  Laterality: Right;  PEC BLOCK  . SHOULDER SURGERY    . TONSILLECTOMY      There were no vitals filed for this visit.  Subjective Assessment - 08/19/19 1006    Subjective  Patient underwent a left lumpectomy and 2 negative nodes removed on 07/29/2019. She has her initial radiation appointment today for simulation.     Pertinent History  Patient was diagnosed on 05/17/2019 with left grade II invasive ductal carcinoma breast cancer. Patient underwent a left lumpectomy and 2 negative nodes removed on 07/29/2019. It is ER/PR positive and HER2 negative with a Ki67 of 5%. She has a history of a right rotator cuff repair in 2015.    Patient Stated Goals  See how my arm is doing    Currently in Pain?  Yes    Pain Score  2     Pain Location  Axilla    Pain Orientation  Left    Pain Descriptors / Indicators  Tender    Pain Type  Surgical pain    Pain Onset  1 to 4 weeks ago    Pain Frequency  Intermittent    Aggravating Factors   Touching the axilla    Pain Relieving Factors  Nothing         [facility] PT Assessment - 08/19/19 0001      Assessment   Medical Diagnosis  s/p left lumpectomy and SLNB    Referring Provider (PT)  Dr. Stark Klein    Onset Date/Surgical Date  07/30/19    Hand Dominance  Right    Prior Therapy  Baselines      Precautions   Precautions  Other (comment)    Precaution Comments  recent  surgery      Restrictions   Weight Bearing Restrictions  No      Balance Screen   Has the patient fallen in the past 6 months  No    Has the patient had a decrease in activity level because of a fear of falling?   No    Is the patient reluctant to leave their home because of a fear of falling?   No      Home Film/video editor residence    Living Arrangements  Spouse/significant other    Available Help at Discharge  Family      Prior Function   Level of Independence  Independent    Vocation  Retired    Biomedical scientist  Retired from Printmaker at [employer]  She has returned to tennis      Cognition   Overall Cognitive Status  Within Functional Limits for tasks assessed      Observation/Other Assessments   Observations  Incision appear to be well healed with no redness. Some mild bumpiness noted on lfet axilla and breast from scar tissue.       Posture/Postural Control   Posture/Postural Control  Postural limitations    Postural Limitations  Rounded Shoulders;Forward head      ROM / Strength   AROM / PROM / Strength  AROM      AROM   AROM Assessment Site  Shoulder    Right/Left Shoulder  Left    Left Shoulder Extension  44 Degrees    Left Shoulder Flexion  152 Degrees    Left Shoulder ABduction  166 Degrees    Left Shoulder Internal Rotation  71 Degrees    Left Shoulder External Rotation  70 Degrees      Strength   Overall Strength  Within functional limits for tasks performed        LYMPHEDEMA/ONCOLOGY QUESTIONNAIRE - 08/19/19 1016      Type   Cancer Type  Left breast cancer      Surgeries   Lumpectomy Date  07/29/19    Sentinel Lymph Node Biopsy Date  07/29/19    Number Lymph Nodes Removed  2      Treatment   Active Chemotherapy Treatment  No    Past Chemotherapy Treatment  No    Active Radiation Treatment  No    Past Radiation Treatment  No    Current Hormone Treatment  No    Past Hormone Therapy  No      What other symptoms do you have   Are you Having Heaviness or Tightness  No    Are you having Pain  Yes    Are you having pitting edema  No    Is it Hard or Difficult finding clothes that fit  No    Do you have infections  No    Is there Decreased scar mobility  Yes    Stemmer Sign  No      Lymphedema Assessments   Lymphedema Assessments  Upper extremities      Right Upper Extremity Lymphedema   10 cm Proximal to Olecranon Process  24.2 cm    Olecranon Process  21.7 cm    10 cm Proximal to Ulnar Styloid Process  18.8 cm    Just Proximal to Ulnar Styloid Process  14.3 cm    Across Hand at [employer]  17.9 cm    At [place] of 2nd  Digit  6.2 cm      Left Upper Extremity Lymphedema   10 cm Proximal to Olecranon Process  23.2 cm    Olecranon Process  22.2 cm    10 cm Proximal to Ulnar Styloid Process  16.7 cm    Just Proximal to Ulnar Styloid Process  13.5 cm    Across Hand at [employer]  16.8 cm    At [place] of 2nd Digit  5.7 cm         Quick Dash - 08/19/19 0001    Open a tight or new jar  Mild difficulty    Do heavy household chores (wash walls, wash floors)  No difficulty    Carry a shopping bag or briefcase  No difficulty    Wash your back  No difficulty    Use a knife to cut food  No difficulty    Recreational activities in which you take some force or impact through your arm, shoulder, or hand (golf, hammering, tennis)  No difficulty    During the past week, to what extent has your arm, shoulder or hand problem interfered with your normal social activities with family, friends, neighbors, or groups?  Not at all    During the past week, to what extent has your arm, shoulder or hand problem limited your work or other regular daily activities  Not at all    Arm, shoulder, or hand pain.  None    Tingling (pins and needles) in your arm, shoulder, or hand  None    Difficulty Sleeping  No difficulty    DASH Score  2.27 %                     PT Education - 08/19/19 1021    Education Details  Closed chain shoulder abduction stretch    Methods  Explanation;Demonstration;Handout    Comprehension  Returned demonstration;Verbalized understanding          PT Long Term Goals - 08/19/19 1050      PT LONG TERM GOAL #1   Title  Patient will demonstrate she has regained full shoulder ROM and function post operatively compared to baselines.    Time  8    Period  Weeks    Status  Achieved            Plan - 08/19/19 1048    Clinical Impression Statement  Patient is doing very well s/p left lumpectomy and sentinel node biopsy. She had 2 negative nodes removed and is scheduled for radiation. She has regained full shoulder ROM and function, is back to playing tennis, has no signs of lymphedema, and her incisions are well healed. She plans to participate in the After Breast Cancer class to learn about lymphedema risk reduction, but otherwise has no PT  needs at this time.    Stability/Clinical Decision Making  Stable/Uncomplicated    PT Treatment/Interventions  ADLs/Self Care Home Management;Patient/family education;Therapeutic exercise    PT Next Visit Plan  D/C    PT Home Exercise Plan  Post op shoulder ROM HEP and closed chain abduction.    Consulted and Agree with Plan of Care  Patient       Patient will benefit from skilled therapeutic intervention in order to improve the following deficits and impairments:  Postural dysfunction, Decreased range of motion, Pain, Impaired UE functional use, Decreased knowledge of precautions  Visit Diagnosis: Malignant neoplasm of upper-outer quadrant of left breast in female, estrogen  receptor positive ([place])  Abnormal posture  Aftercare following surgery for neoplasm     Problem List Patient Active Problem List   Diagnosis Date Noted  . Malignant neoplasm of upper-outer quadrant of left breast in female, estrogen receptor positive ([place]) 05/21/2019  . Acute pain of right knee 07/20/2018  . HLD (hyperlipidemia) 07/20/2018  . Other fatigue 01/12/2018  . Low back pain 07/01/2016  . Hormone replacement therapy 03/19/2016  . Insomnia 03/14/2016  . Screening for cardiovascular condition 03/14/2016  . Synovial cyst of lumbar facet joint 02/15/2016  . Thumb pain, left 11/30/2014  . Calcium pyrophosphate crystal disease 10/18/2014  . Patellofemoral chondrosis, left 10/18/2014  . Other tear of lateral meniscus, current injury, left knee, subsequent encounter 10/05/2014  . AC joint arthropathy 08/04/2014  . Complete rupture of rotator cuff 03/19/2013  . Bicipital tenosynovitis 03/18/2013  . Disorder of bursae and tendons in shoulder region 03/18/2013  . Sprain and strain of other specified sites of shoulder and upper arm 03/18/2013  . Impingement syndrome of right shoulder 01/26/2013  . Shoulder joint pain 12/29/2012   PHYSICAL THERAPY DISCHARGE SUMMARY  Visits from Start of Care:  2  Current functional level related to goals / functional outcomes: Goals met.    Remaining deficits: She only has a 9 degrees limitation in abduction compared to baseline but was instructed in a closed chain abduction stretch today which should help regain full ROM. She has mild bumpy scar tissue present around incisions but was instructed with scar massage.   Education / Equipment: HEP and lymphedema risk reduction  Plan: Patient agrees to discharge.  Patient goals were met. Patient is being discharged due to meeting the stated rehab goals.  ?????      Annia Friendly, [place] 08/19/19 10:53 AM  [place], [place], [address] Phone: [phone]   Fax:  [phone]  Name: MACKENZEY CROWNOVER MRN: [id number] Date of Birth: [birth year]

## 2019-08-19 NOTE — Progress Notes (Addendum)
Patient here for a consult with Dr. Sondra Come.    Malignant neoplasm of upper-outer quadrant of left breast in female, estrogen receptor positive ([place])  Histology per Pathology Report:Screening mammogram on 05/17/19 showed an asymmetry and calcifications in the left breast. Diagnostic mammogram and [place] on 05/19/19 showed a 1.9cm mass and calcifications in the upper outer left breast.   Screening mammogram on 05/17/19 showed an asymmetry and calcifications in the left breast. Diagnostic mammogram and [place] on 05/19/19 showed a 1.9cm mass and calcifications in the upper outer left breast.   Right breast seed localized excisional biopsy, left Breast Radioactive seed localized lumpectomy and sentinel lymph node biopsy 07/29/19   This patient presents with history of abnormal right mammogram and left breast cancer, lower outer quadrant, grade 2 invasive ductal carcinoma, ER/PR +, Her 2 -, [id number] Past/Anticipated interventions by surgeon, if any: No  Past/Anticipated interventions by medical oncology, if any: Chemotherapy No  Lymphedema issues, if any: No    Pain issues, if any: No  SAFETY ISSUES:  Prior radiation? No   Pacemaker/ICD? No   Possible current pregnancy? Postmenopausal  Is the patient on methotrexate? No  Vitals:   08/19/19 1247  BP: 129/84  Pulse: 73  Temp: 98 F (36.7 C)  Resp: 18  Height: 5\' 9"  (1.753 m)  Weight: 138 lb 6 oz (62.8 kg)  SpO2: 99%  TempSrc: Temporal  BMI (Calculated): 20.42    Wt Readings from Last 3 Encounters:  08/19/19 138 lb 6 oz (62.8 kg)  08/05/19 140 lb (63.5 kg)  07/29/19 138 lb (62.6 kg)      De Burrs, RN 08/19/2019,7:53 AM

## 2019-08-19 NOTE — Progress Notes (Signed)
Radiation Oncology         [phone]) [phone] ________________________________  Name: Julie Rivera MRN: [id number]  Date: 08/19/2019  DOB: Jan 24, 1946  SIMULATION AND TREATMENT PLANNING NOTE    ICD-10-CM   1. Malignant neoplasm of upper-outer quadrant of left breast in female, estrogen receptor positive ([place])  C50.412    Z17.0     DIAGNOSIS: Stage IA (pT2, pN0), Left Breast UOQ, Invasive Ductal Carcinoma with low-grade DCIS, ER+ / PR+ / Her2-, Grade 1  NARRATIVE:  The patient was brought to the [facility] [place].  Identity was confirmed.  All relevant records and images related to the planned course of therapy were reviewed.  The patient freely provided informed written consent to proceed with treatment after reviewing the details related to the planned course of therapy. The consent form was witnessed and verified by the simulation staff.  Then, the patient was set-up in a stable reproducible supine position for radiation therapy.  CT images were obtained.  Surface markings were placed.  The CT images were loaded into the planning software.  Then the target and avoidance structures were contoured.  Treatment planning then occurred.  The radiation prescription was entered and confirmed.  Then, I designed and supervised the construction of a total of 5 medically necessary complex treatment devices.  I have requested : 3D Simulation  I have requested a DVH of the following structures: Heart, lungs,  lumpectomy cavity.  I have ordered:dose calc.  PLAN:  The patient will receive 40.5 Gy in 15 fractions followed by a boost to the lumpectomy cavity of 12 Gy in 6 fractions.  -----------------------------   Optical Surface Tracking Plan:  Since intensity modulated radiotherapy (IMRT) and 3D conformal radiation treatment methods are predicated on accurate and precise positioning for treatment, intrafraction motion monitoring is medically necessary to ensure accurate and safe treatment  delivery.  The ability to quantify intrafraction motion without excessive ionizing radiation dose can only be performed with optical surface tracking. Accordingly, surface imaging offers the opportunity to obtain 3D measurements of patient position throughout IMRT and 3D treatments without excessive radiation exposure.  I am ordering optical surface tracking for this patient's upcoming course of radiotherapy. ________________________________  Special treatment procedure was performed today due to the extra time and effort required by myself to plan and prepare this patient for deep inspiration breath hold technique.  I have determined cardiac sparing to be of benefit to this patient to prevent long term cardiac damage due to radiation of the heart.  Bellows were placed on the patient's abdomen. To facilitate cardiac sparing, the patient was coached by the radiation therapists on breath hold techniques and breathing practice was performed. Practice waveforms were obtained. The patient was then scanned while maintaining breath hold in the treatment position.  This image was then transferred over to the imaging specialist. The imaging specialist then created a fusion of the free breathing and breath hold scans using the chest wall as the stable structure. I personally reviewed the fusion in axial, coronal and sagittal image planes.  Excellent cardiac sparing was obtained.  I felt the patient is an appropriate candidate for breath hold and the patient will be treated as such.  The image fusion was then reviewed with the patient to reinforce the necessity of reproducible breath hold   Blair Promise, PhD, MD  This document serves as a record of services personally performed by Gery Pray, MD. It was created on his behalf by Clerance Lav, a  trained medical scribe. The creation of this record is based on the scribe's personal observations and the provider's statements to them. This document has been checked and  approved by the attending provider.  

## 2019-08-23 ENCOUNTER — Ambulatory Visit (INDEPENDENT_AMBULATORY_CARE_PROVIDER_SITE_OTHER): Payer: Medicare Other | Admitting: Dermatology

## 2019-08-23 ENCOUNTER — Other Ambulatory Visit: Payer: Self-pay

## 2019-08-23 ENCOUNTER — Encounter: Payer: Self-pay | Admitting: *Deleted

## 2019-08-23 DIAGNOSIS — Z170 Estrogen receptor positive status [ER+]: Secondary | ICD-10-CM | POA: Diagnosis not present

## 2019-08-23 DIAGNOSIS — D0512 Intraductal carcinoma in situ of left breast: Secondary | ICD-10-CM | POA: Diagnosis not present

## 2019-08-23 DIAGNOSIS — Z85828 Personal history of other malignant neoplasm of skin: Secondary | ICD-10-CM | POA: Diagnosis not present

## 2019-08-23 DIAGNOSIS — Z510 Encounter for antineoplastic radiation therapy: Secondary | ICD-10-CM | POA: Diagnosis not present

## 2019-08-23 DIAGNOSIS — C50412 Malignant neoplasm of upper-outer quadrant of left female breast: Secondary | ICD-10-CM | POA: Diagnosis not present

## 2019-08-23 DIAGNOSIS — Z86007 Personal history of in-situ neoplasm of skin: Secondary | ICD-10-CM

## 2019-08-23 NOTE — Progress Notes (Signed)
   Follow-Up Visit   Subjective  Julie Rivera is a 74 y.o. female who presents for the following: Follow-up ([place] Left nasal tip 03/22/2019, sent to Dr Lacinda Axon at [facility] for [facility] surgery, performed on Feb 12/2019 was not pleased with appearance immediately after surgery).  Has photos.  She feels like the surgery was much more aggressive than what she was prepared for.  Nose is better with areas of lumpy skin and redness.   The following portions of the chart were reviewed this encounter and updated as appropriate:     Review of Systems:  No other skin or systemic complaints except as noted in HPI or Assessment and Plan.  Objective  Well appearing patient in no apparent distress; mood and affect are within normal limits.  A focused examination was performed including Left nasal tip and face. Relevant physical exam findings are noted in the Assessment and Plan.  Objective  Left Tip of Nose: Well healed scar with no evidence of recurrence.  Erythema on nose, slightly raised below inferior scar at mid nasal tip  Patient showed photos of nose post-op.  She had significant redness, swelling of nose/eyes with associated bruising  Images         Assessment & Plan  History of basal cell carcinoma (BCC) Left Tip of Nose  Clear. Observe for recurrence. Call clinic for new or changing lesions.  Recommend regular skin exams, daily broad-spectrum spf 30+ sunscreen use, and photoprotection.     Recommend waiting a full year post treatment to get best results from scar remodeling.  Sunscreen daily to minimize redness.  Discussed vascular laser treatment for redness, telangiectasias Discussed dermabrasion or steroid injection for slight raised portion inferior to scar   History of Basal Cell Carcinoma of the Skin Left nasal tip 03/22/19, Left lat upper chest, Left chest inferior clavicle 03/26/2010, Left medial pretibial 09/12/2009, and Right medial calf 08/15/2009 - Recommend regular full body  skin exams - Recommend daily broad spectrum sunscreen SPF 30+ to sun-exposed areas, reapply every 2 hours as needed.  - Call if any new or changing lesions are noted between office visits  History of Squamous Cell Carcinoma in Situ of the Skin Right anterior thigh x 2  04/22/2016 - Recommend regular full body skin exams - Recommend daily broad spectrum sunscreen SPF 30+ to sun-exposed areas, reapply every 2 hours as needed.  - Call if any new or changing lesions are noted between office visits   Return for TBSE in January 2022.  Marene Lenz, CMA, am acting as scribe for Brendolyn Patty, MD .  Documentation: I have reviewed the above documentation for accuracy and completeness, and I agree with the above.  Brendolyn Patty MD

## 2019-08-23 NOTE — Patient Instructions (Signed)
Recommend daily broad spectrum sunscreen SPF 30+ to sun-exposed areas, reapply every 2 hours as needed. Call for new or changing lesions.  

## 2019-08-25 ENCOUNTER — Other Ambulatory Visit: Payer: Self-pay

## 2019-08-25 ENCOUNTER — Ambulatory Visit
Admission: RE | Admit: 2019-08-25 | Discharge: 2019-08-25 | Disposition: A | Discharge status: Home / Self Care | Payer: Medicare Other | Source: Ambulatory Visit | Attending: Radiation Oncology | Admitting: Radiation Oncology

## 2019-08-25 DIAGNOSIS — Z170 Estrogen receptor positive status [ER+]: Secondary | ICD-10-CM | POA: Diagnosis not present

## 2019-08-25 DIAGNOSIS — Z510 Encounter for antineoplastic radiation therapy: Secondary | ICD-10-CM | POA: Diagnosis not present

## 2019-08-25 DIAGNOSIS — D0512 Intraductal carcinoma in situ of left breast: Secondary | ICD-10-CM | POA: Diagnosis not present

## 2019-08-25 DIAGNOSIS — C50412 Malignant neoplasm of upper-outer quadrant of left female breast: Secondary | ICD-10-CM

## 2019-08-25 NOTE — Progress Notes (Signed)
  Radiation Oncology         [phone]) [phone] ________________________________  Name: Julie Rivera MRN: [id number]  Date: 08/25/2019  DOB: 1945/05/05  Simulation Verification Note    ICD-10-CM   1. Malignant neoplasm of upper-outer quadrant of left breast in female, estrogen receptor positive ([place])  C50.412    Z17.0     NARRATIVE: The patient was brought to the treatment unit and placed in the planned treatment position. The clinical setup was verified. Then port films were obtained and uploaded to the radiation oncology medical record software.  The treatment beams were carefully compared against the planned radiation fields. The position location and shape of the radiation fields was reviewed. They targeted volume of tissue appears to be appropriately covered by the radiation beams. Organs at risk appear to be excluded as planned.  Based on my personal review, I approved the simulation verification. The patient's treatment will proceed as planned.  -----------------------------------  Blair Promise, PhD, MD  This document serves as a record of services personally performed by Gery Pray, MD. It was created on his behalf by Clerance Lav, a trained medical scribe. The creation of this record is based on the scribe's personal observations and the provider's statements to them. This document has been checked and approved by the attending provider.

## 2019-08-26 ENCOUNTER — Ambulatory Visit
Admission: RE | Admit: 2019-08-26 | Discharge: 2019-08-26 | Disposition: A | Discharge status: Home / Self Care | Payer: Medicare Other | Source: Ambulatory Visit | Attending: Radiation Oncology | Admitting: Radiation Oncology

## 2019-08-26 ENCOUNTER — Other Ambulatory Visit: Payer: Self-pay

## 2019-08-26 DIAGNOSIS — D0512 Intraductal carcinoma in situ of left breast: Secondary | ICD-10-CM | POA: Diagnosis not present

## 2019-08-26 DIAGNOSIS — C50412 Malignant neoplasm of upper-outer quadrant of left female breast: Secondary | ICD-10-CM | POA: Diagnosis not present

## 2019-08-26 DIAGNOSIS — Z170 Estrogen receptor positive status [ER+]: Secondary | ICD-10-CM | POA: Diagnosis not present

## 2019-08-26 DIAGNOSIS — Z510 Encounter for antineoplastic radiation therapy: Secondary | ICD-10-CM | POA: Diagnosis not present

## 2019-08-27 ENCOUNTER — Ambulatory Visit
Admission: RE | Admit: 2019-08-27 | Discharge: 2019-08-27 | Disposition: A | Discharge status: Home / Self Care | Payer: Medicare Other | Source: Ambulatory Visit | Attending: Radiation Oncology | Admitting: Radiation Oncology

## 2019-08-27 ENCOUNTER — Other Ambulatory Visit: Payer: Self-pay

## 2019-08-27 DIAGNOSIS — Z510 Encounter for antineoplastic radiation therapy: Secondary | ICD-10-CM | POA: Diagnosis not present

## 2019-08-27 DIAGNOSIS — Z170 Estrogen receptor positive status [ER+]: Secondary | ICD-10-CM | POA: Diagnosis not present

## 2019-08-27 DIAGNOSIS — D0512 Intraductal carcinoma in situ of left breast: Secondary | ICD-10-CM | POA: Diagnosis not present

## 2019-08-27 DIAGNOSIS — C50412 Malignant neoplasm of upper-outer quadrant of left female breast: Secondary | ICD-10-CM | POA: Diagnosis not present

## 2019-08-30 ENCOUNTER — Ambulatory Visit
Admission: RE | Admit: 2019-08-30 | Discharge: 2019-08-30 | Disposition: A | Discharge status: Home / Self Care | Payer: Medicare Other | Source: Ambulatory Visit | Attending: Radiation Oncology | Admitting: Radiation Oncology

## 2019-08-30 ENCOUNTER — Other Ambulatory Visit: Payer: Self-pay

## 2019-08-30 DIAGNOSIS — D0512 Intraductal carcinoma in situ of left breast: Secondary | ICD-10-CM | POA: Diagnosis not present

## 2019-08-30 DIAGNOSIS — C50412 Malignant neoplasm of upper-outer quadrant of left female breast: Secondary | ICD-10-CM | POA: Diagnosis not present

## 2019-08-30 DIAGNOSIS — Z170 Estrogen receptor positive status [ER+]: Secondary | ICD-10-CM | POA: Diagnosis not present

## 2019-08-30 DIAGNOSIS — Z510 Encounter for antineoplastic radiation therapy: Secondary | ICD-10-CM | POA: Diagnosis not present

## 2019-08-31 ENCOUNTER — Ambulatory Visit
Admission: RE | Admit: 2019-08-31 | Discharge: 2019-08-31 | Disposition: A | Discharge status: Home / Self Care | Payer: Medicare Other | Source: Ambulatory Visit | Attending: Radiation Oncology | Admitting: Radiation Oncology

## 2019-08-31 ENCOUNTER — Encounter: Payer: Self-pay | Admitting: *Deleted

## 2019-08-31 ENCOUNTER — Telehealth: Payer: Self-pay | Admitting: Hematology and Oncology

## 2019-08-31 DIAGNOSIS — D0512 Intraductal carcinoma in situ of left breast: Secondary | ICD-10-CM | POA: Diagnosis not present

## 2019-08-31 DIAGNOSIS — C50412 Malignant neoplasm of upper-outer quadrant of left female breast: Secondary | ICD-10-CM

## 2019-08-31 DIAGNOSIS — Z170 Estrogen receptor positive status [ER+]: Secondary | ICD-10-CM | POA: Diagnosis not present

## 2019-08-31 DIAGNOSIS — Z510 Encounter for antineoplastic radiation therapy: Secondary | ICD-10-CM | POA: Diagnosis not present

## 2019-08-31 MED ORDER — ALRA NON-METALLIC DEODORANT (RAD-ONC)
1.0000 "application " | Freq: Once | TOPICAL | Status: AC
  Administered 2019-08-31: 1 via TOPICAL

## 2019-08-31 MED ORDER — SONAFINE EX EMUL
1.0000 "application " | Freq: Two times a day (BID) | CUTANEOUS | Status: DC
  Administered 2019-08-31: 1 via TOPICAL

## 2019-08-31 NOTE — Telephone Encounter (Signed)
Scheduled appt per 5/25 sch message -  Mailed reminder letter with appt date and time

## 2019-09-01 ENCOUNTER — Ambulatory Visit
Admission: RE | Admit: 2019-09-01 | Discharge: 2019-09-01 | Disposition: A | Discharge status: Home / Self Care | Payer: Medicare Other | Source: Ambulatory Visit | Attending: Radiation Oncology | Admitting: Radiation Oncology

## 2019-09-01 ENCOUNTER — Other Ambulatory Visit: Payer: Self-pay

## 2019-09-01 DIAGNOSIS — C50412 Malignant neoplasm of upper-outer quadrant of left female breast: Secondary | ICD-10-CM | POA: Diagnosis not present

## 2019-09-01 DIAGNOSIS — Z170 Estrogen receptor positive status [ER+]: Secondary | ICD-10-CM | POA: Diagnosis not present

## 2019-09-01 DIAGNOSIS — Z510 Encounter for antineoplastic radiation therapy: Secondary | ICD-10-CM | POA: Diagnosis not present

## 2019-09-01 DIAGNOSIS — D0512 Intraductal carcinoma in situ of left breast: Secondary | ICD-10-CM | POA: Diagnosis not present

## 2019-09-02 ENCOUNTER — Ambulatory Visit
Admission: RE | Admit: 2019-09-02 | Discharge: 2019-09-02 | Disposition: A | Discharge status: Home / Self Care | Payer: Medicare Other | Source: Ambulatory Visit | Attending: Radiation Oncology | Admitting: Radiation Oncology

## 2019-09-02 ENCOUNTER — Other Ambulatory Visit: Payer: Self-pay

## 2019-09-02 DIAGNOSIS — C50412 Malignant neoplasm of upper-outer quadrant of left female breast: Secondary | ICD-10-CM | POA: Diagnosis not present

## 2019-09-02 DIAGNOSIS — Z170 Estrogen receptor positive status [ER+]: Secondary | ICD-10-CM | POA: Diagnosis not present

## 2019-09-02 DIAGNOSIS — Z510 Encounter for antineoplastic radiation therapy: Secondary | ICD-10-CM | POA: Diagnosis not present

## 2019-09-02 DIAGNOSIS — D0512 Intraductal carcinoma in situ of left breast: Secondary | ICD-10-CM | POA: Diagnosis not present

## 2019-09-03 ENCOUNTER — Other Ambulatory Visit: Payer: Self-pay

## 2019-09-03 ENCOUNTER — Ambulatory Visit
Admission: RE | Admit: 2019-09-03 | Discharge: 2019-09-03 | Disposition: A | Discharge status: Home / Self Care | Payer: Medicare Other | Source: Ambulatory Visit | Attending: Radiation Oncology | Admitting: Radiation Oncology

## 2019-09-03 DIAGNOSIS — Z510 Encounter for antineoplastic radiation therapy: Secondary | ICD-10-CM | POA: Diagnosis not present

## 2019-09-03 DIAGNOSIS — C50412 Malignant neoplasm of upper-outer quadrant of left female breast: Secondary | ICD-10-CM | POA: Diagnosis not present

## 2019-09-03 DIAGNOSIS — D0512 Intraductal carcinoma in situ of left breast: Secondary | ICD-10-CM | POA: Diagnosis not present

## 2019-09-03 DIAGNOSIS — Z170 Estrogen receptor positive status [ER+]: Secondary | ICD-10-CM | POA: Diagnosis not present

## 2019-09-07 ENCOUNTER — Ambulatory Visit
Admission: RE | Admit: 2019-09-07 | Discharge: 2019-09-07 | Disposition: A | Discharge status: Home / Self Care | Payer: Medicare Other | Source: Ambulatory Visit | Attending: Radiation Oncology | Admitting: Radiation Oncology

## 2019-09-07 ENCOUNTER — Other Ambulatory Visit: Payer: Self-pay

## 2019-09-07 DIAGNOSIS — Z510 Encounter for antineoplastic radiation therapy: Secondary | ICD-10-CM | POA: Insufficient documentation

## 2019-09-07 DIAGNOSIS — D0512 Intraductal carcinoma in situ of left breast: Secondary | ICD-10-CM | POA: Insufficient documentation

## 2019-09-07 DIAGNOSIS — Z170 Estrogen receptor positive status [ER+]: Secondary | ICD-10-CM | POA: Insufficient documentation

## 2019-09-07 DIAGNOSIS — C50412 Malignant neoplasm of upper-outer quadrant of left female breast: Secondary | ICD-10-CM | POA: Insufficient documentation

## 2019-09-08 ENCOUNTER — Encounter: Payer: Self-pay | Admitting: *Deleted

## 2019-09-08 ENCOUNTER — Ambulatory Visit
Admission: RE | Admit: 2019-09-08 | Discharge: 2019-09-08 | Disposition: A | Discharge status: Home / Self Care | Payer: Medicare Other | Source: Ambulatory Visit | Attending: Radiation Oncology | Admitting: Radiation Oncology

## 2019-09-08 ENCOUNTER — Other Ambulatory Visit: Payer: Self-pay

## 2019-09-08 DIAGNOSIS — C50412 Malignant neoplasm of upper-outer quadrant of left female breast: Secondary | ICD-10-CM | POA: Diagnosis not present

## 2019-09-08 DIAGNOSIS — D0512 Intraductal carcinoma in situ of left breast: Secondary | ICD-10-CM | POA: Diagnosis not present

## 2019-09-08 DIAGNOSIS — Z170 Estrogen receptor positive status [ER+]: Secondary | ICD-10-CM | POA: Diagnosis not present

## 2019-09-08 DIAGNOSIS — Z510 Encounter for antineoplastic radiation therapy: Secondary | ICD-10-CM | POA: Diagnosis not present

## 2019-09-09 ENCOUNTER — Ambulatory Visit
Admission: RE | Admit: 2019-09-09 | Discharge: 2019-09-09 | Disposition: A | Discharge status: Home / Self Care | Payer: Medicare Other | Source: Ambulatory Visit | Attending: Radiation Oncology | Admitting: Radiation Oncology

## 2019-09-09 ENCOUNTER — Other Ambulatory Visit: Payer: Self-pay

## 2019-09-09 DIAGNOSIS — Z170 Estrogen receptor positive status [ER+]: Secondary | ICD-10-CM | POA: Diagnosis not present

## 2019-09-09 DIAGNOSIS — D0512 Intraductal carcinoma in situ of left breast: Secondary | ICD-10-CM | POA: Diagnosis not present

## 2019-09-09 DIAGNOSIS — C50412 Malignant neoplasm of upper-outer quadrant of left female breast: Secondary | ICD-10-CM | POA: Diagnosis not present

## 2019-09-09 DIAGNOSIS — Z510 Encounter for antineoplastic radiation therapy: Secondary | ICD-10-CM | POA: Diagnosis not present

## 2019-09-10 ENCOUNTER — Other Ambulatory Visit: Payer: Self-pay

## 2019-09-10 ENCOUNTER — Ambulatory Visit
Admission: RE | Admit: 2019-09-10 | Discharge: 2019-09-10 | Disposition: A | Discharge status: Home / Self Care | Payer: Medicare Other | Source: Ambulatory Visit | Attending: Radiation Oncology | Admitting: Radiation Oncology

## 2019-09-10 DIAGNOSIS — C50412 Malignant neoplasm of upper-outer quadrant of left female breast: Secondary | ICD-10-CM | POA: Diagnosis not present

## 2019-09-10 DIAGNOSIS — D0512 Intraductal carcinoma in situ of left breast: Secondary | ICD-10-CM | POA: Diagnosis not present

## 2019-09-10 DIAGNOSIS — Z510 Encounter for antineoplastic radiation therapy: Secondary | ICD-10-CM | POA: Diagnosis not present

## 2019-09-10 DIAGNOSIS — Z170 Estrogen receptor positive status [ER+]: Secondary | ICD-10-CM | POA: Diagnosis not present

## 2019-09-13 ENCOUNTER — Other Ambulatory Visit: Payer: Self-pay

## 2019-09-13 ENCOUNTER — Ambulatory Visit
Admission: RE | Admit: 2019-09-13 | Discharge: 2019-09-13 | Disposition: A | Discharge status: Home / Self Care | Payer: Medicare Other | Source: Ambulatory Visit | Attending: Radiation Oncology | Admitting: Radiation Oncology

## 2019-09-13 DIAGNOSIS — C50412 Malignant neoplasm of upper-outer quadrant of left female breast: Secondary | ICD-10-CM | POA: Diagnosis not present

## 2019-09-13 DIAGNOSIS — Z170 Estrogen receptor positive status [ER+]: Secondary | ICD-10-CM | POA: Diagnosis not present

## 2019-09-13 DIAGNOSIS — Z510 Encounter for antineoplastic radiation therapy: Secondary | ICD-10-CM | POA: Diagnosis not present

## 2019-09-13 DIAGNOSIS — D0512 Intraductal carcinoma in situ of left breast: Secondary | ICD-10-CM | POA: Diagnosis not present

## 2019-09-14 ENCOUNTER — Ambulatory Visit
Admission: RE | Admit: 2019-09-14 | Discharge: 2019-09-14 | Disposition: A | Discharge status: Home / Self Care | Payer: Medicare Other | Source: Ambulatory Visit | Attending: Radiation Oncology | Admitting: Radiation Oncology

## 2019-09-14 ENCOUNTER — Other Ambulatory Visit: Payer: Self-pay

## 2019-09-14 DIAGNOSIS — Z170 Estrogen receptor positive status [ER+]: Secondary | ICD-10-CM

## 2019-09-14 DIAGNOSIS — D0512 Intraductal carcinoma in situ of left breast: Secondary | ICD-10-CM | POA: Diagnosis not present

## 2019-09-14 DIAGNOSIS — C50412 Malignant neoplasm of upper-outer quadrant of left female breast: Secondary | ICD-10-CM

## 2019-09-14 DIAGNOSIS — Z510 Encounter for antineoplastic radiation therapy: Secondary | ICD-10-CM | POA: Diagnosis not present

## 2019-09-14 MED ORDER — SONAFINE EX EMUL
1.0000 "application " | Freq: Two times a day (BID) | CUTANEOUS | Status: DC
  Administered 2019-09-14: 1 via TOPICAL

## 2019-09-15 ENCOUNTER — Ambulatory Visit
Admission: RE | Admit: 2019-09-15 | Discharge: 2019-09-15 | Disposition: A | Discharge status: Home / Self Care | Payer: Medicare Other | Source: Ambulatory Visit | Attending: Radiation Oncology | Admitting: Radiation Oncology

## 2019-09-15 ENCOUNTER — Other Ambulatory Visit: Payer: Self-pay | Admitting: Family

## 2019-09-15 ENCOUNTER — Other Ambulatory Visit: Payer: Self-pay

## 2019-09-15 DIAGNOSIS — Z510 Encounter for antineoplastic radiation therapy: Secondary | ICD-10-CM | POA: Diagnosis not present

## 2019-09-15 DIAGNOSIS — C50412 Malignant neoplasm of upper-outer quadrant of left female breast: Secondary | ICD-10-CM | POA: Diagnosis not present

## 2019-09-15 DIAGNOSIS — D0512 Intraductal carcinoma in situ of left breast: Secondary | ICD-10-CM | POA: Diagnosis not present

## 2019-09-15 DIAGNOSIS — Z170 Estrogen receptor positive status [ER+]: Secondary | ICD-10-CM | POA: Diagnosis not present

## 2019-09-15 NOTE — Telephone Encounter (Signed)
Call pt Refilled ambien as courtesy Would you ask pt to make follow up appt particularly since a controlled substance? Last saw a year ago

## 2019-09-15 NOTE — Telephone Encounter (Signed)
Refill request for Julie Rivera, last seen 07-20-18.  Please advise.

## 2019-09-16 ENCOUNTER — Other Ambulatory Visit: Payer: Self-pay

## 2019-09-16 ENCOUNTER — Ambulatory Visit
Admission: RE | Admit: 2019-09-16 | Discharge: 2019-09-16 | Disposition: A | Discharge status: Home / Self Care | Payer: Medicare Other | Source: Ambulatory Visit | Attending: Radiation Oncology | Admitting: Radiation Oncology

## 2019-09-16 DIAGNOSIS — D0512 Intraductal carcinoma in situ of left breast: Secondary | ICD-10-CM | POA: Diagnosis not present

## 2019-09-16 DIAGNOSIS — C50412 Malignant neoplasm of upper-outer quadrant of left female breast: Secondary | ICD-10-CM | POA: Diagnosis not present

## 2019-09-16 DIAGNOSIS — Z510 Encounter for antineoplastic radiation therapy: Secondary | ICD-10-CM | POA: Diagnosis not present

## 2019-09-16 DIAGNOSIS — Z170 Estrogen receptor positive status [ER+]: Secondary | ICD-10-CM | POA: Diagnosis not present

## 2019-09-16 NOTE — Progress Notes (Signed)
 Patient Care Team: Arnett, Margaret G, FNP as PCP - General (Family Medicine) Stuart, Dawn C, RN as Oncology Nurse Navigator Martini, Keisha N, RN as Oncology Nurse Navigator Byerly, Faera, MD as Consulting Physician (General Surgery) Gudena, Vinay, MD as Consulting Physician (Hematology and Oncology) Kinard, James, MD as Consulting Physician (Radiation Oncology)  DIAGNOSIS:    ICD-10-CM   1. Malignant neoplasm of upper-outer quadrant of left breast in female, estrogen receptor positive (HCC)  C50.412    Z17.0     SUMMARY OF ONCOLOGIC HISTORY: Oncology History  Malignant neoplasm of upper-outer quadrant of left breast in female, estrogen receptor positive (HCC)  05/21/2019 Initial Diagnosis   Screening mammogram detected an asymmetry and calcifications 4.5 cm in the left breast. Diagnostic mammogram showed a 1.9cm mass and calcifications in the upper outer left breast.  Biopsy revealed grade 2 IDC ER/PR positive HER-2 negative with a Ki-67 of 5%   07/29/2019 Surgery   Left lumpectomy (Byerly): IDC, grade 1, 2.4cm, with low grade DCIS, 2 left axillary lymph nodes negative.   07/29/2019 Cancer Staging   Staging form: Breast, AJCC 8th Edition - Pathologic stage from 07/29/2019: Stage IA (pT2, pN0, cM0, G1, ER+, PR+, HER2-) - Signed by Causey, Lindsey Cornetto, NP on 08/11/2019   08/26/2019 -  Radiation Therapy   Adjuvant radiation     CHIEF COMPLIANT: Follow-up of left breast cancer to discuss antiestrogen therapy   INTERVAL HISTORY: Julie Rivera is a 74 y.o. with above-mentioned history of left breast cancer who underwent a left lumpectomy and is currently undergoing radiation therapy. She presents to the clinic today to discuss antiestrogen therapy.  She is tolerating radiation extremely well.  She has 1 more week of radiation left.  ALLERGIES:  has No Known Allergies.  MEDICATIONS:  Current Outpatient Medications  Medication Sig Dispense Refill  . zolpidem (AMBIEN) 10 MG  tablet TAKE ONE TABLET AT BEDTIME AS NEEDED 30 tablet 0   No current facility-administered medications for this visit.    PHYSICAL EXAMINATION: ECOG PERFORMANCE STATUS: 1 - Symptomatic but completely ambulatory  Vitals:   09/17/19 1114  BP: 136/77  Pulse: 79  Resp: 17  Temp: 98.2 F (36.8 C)  SpO2: 100%   Filed Weights   09/17/19 1114  Weight: 138 lb 4.8 oz (62.7 kg)    LABORATORY DATA:  I have reviewed the data as listed CMP Latest Ref Rng & Units 05/26/2019 01/12/2018 11/10/2017  Glucose 70 - 99 mg/dL 62(L) 76 -  BUN 8 - 23 mg/dL 15 16 -  Creatinine 0.44 - 1.00 mg/dL 0.77 0.71 0.80  Sodium 135 - 145 mmol/L 143 139 -  Potassium 3.5 - 5.1 mmol/L 3.8 4.5 -  Chloride 98 - 111 mmol/L 107 104 -  CO2 22 - 32 mmol/L 28 29 -  Calcium 8.9 - 10.3 mg/dL 9.5 9.8 -  Total Protein 6.5 - 8.1 g/dL 6.8 6.6 -  Total Bilirubin 0.3 - 1.2 mg/dL 0.5 0.5 -  Alkaline Phos 38 - 126 U/L 59 46 -  AST 15 - 41 U/L 16 15 -  ALT 0 - 44 U/L 13 9 -    Lab Results  Component Value Date   WBC 4.1 07/26/2019   HGB 14.2 07/26/2019   HCT 43.6 07/26/2019   MCV 92.4 07/26/2019   PLT 256 07/26/2019   NEUTROABS 2.3 05/26/2019    ASSESSMENT & PLAN:  Malignant neoplasm of upper-outer quadrant of left breast in female, estrogen receptor positive (HCC) 05/21/2019:Screening mammogram   detected an asymmetry and calcifications in the left breastcalcifications span 4.5 cm the palpable lump inside of that measured 1.7 cm. Diagnostic mammogram showed a 1.9cm mass and calcifications in the upper outer left breast.Biopsy revealed grade 2 invasive ductal carcinoma ER/PR positive HER-2 negative with a Ki-67 of 15%  07/29/2019:Left lumpectomy ([facility]): IDC, grade 1, 2.4cm, with low grade DCIS, 2 left axillary lymph nodes negative.  ER/PR positive HER-2 negative with a Ki-67 of 15% positive anterior and inferior margins  Treatment plan: Resection of the positive margins 1.  Adjuvant radiation therapy  08/26/19-09/17/19 2.  Followed by adjuvant antiestrogen therapy: anastrozole 1 mg daily versus letrozole Patient is not interested in chemotherapy and therefore Oncotype DX will not be obtained.  Anastrozole counseling:We discussed the risks and benefits of anti-estrogen therapy with aromatase inhibitors. These include but not limited to insomnia, hot flashes, mood changes, vaginal dryness, bone density loss, and weight gain. We strongly believe that the benefits far outweigh the risks. Patient understands these risks and consented to starting treatment. Planned treatment duration is 5-7 years.  We also discussed the pros and cons of tamoxifen therapy.  Because tamoxifen does not make osteoporosis worse she is more leaning towards tamoxifen.  However I explained to her that it can increase hot flashes.  Her major symptom currently is severe hot flashes because she stopped estrogen replacement therapy.  She is however not interested in taking Effexor.  We will obtain a bone density test and make a decision on anastrozole versus letrozole.  She will do the bone density at [facility]. We will call her with the result of the bone density and go over the plan.  Follow-up in 3 months for survivorship care plan visit    No orders of the defined types were placed in this encounter.  The patient has a good understanding of the overall plan. she agrees with it. she will call with any problems that may develop before the next visit here.  Total time spent: 30 mins including face to face time and time spent for planning, charting and coordination of care  Gudena, Vinay, MD 09/17/2019  I, Molly Dorshimer, am acting as scribe for Dr. Vinay Gudena.  I have reviewed the above documentation for accuracy and completeness, and I agree with the above.       

## 2019-09-17 ENCOUNTER — Inpatient Hospital Stay: Payer: Medicare Other | Attending: Hematology and Oncology | Admitting: Hematology and Oncology

## 2019-09-17 ENCOUNTER — Ambulatory Visit
Admission: RE | Admit: 2019-09-17 | Discharge: 2019-09-17 | Disposition: A | Discharge status: Home / Self Care | Payer: Medicare Other | Source: Ambulatory Visit | Attending: Radiation Oncology | Admitting: Radiation Oncology

## 2019-09-17 ENCOUNTER — Other Ambulatory Visit: Payer: Self-pay

## 2019-09-17 VITALS — BP 136/77 | HR 79 | Temp 98.2°F | Resp 17 | Ht 69.0 in | Wt 138.3 lb

## 2019-09-17 DIAGNOSIS — Z510 Encounter for antineoplastic radiation therapy: Secondary | ICD-10-CM | POA: Diagnosis not present

## 2019-09-17 DIAGNOSIS — Z79811 Long term (current) use of aromatase inhibitors: Secondary | ICD-10-CM | POA: Insufficient documentation

## 2019-09-17 DIAGNOSIS — Z780 Asymptomatic menopausal state: Secondary | ICD-10-CM | POA: Diagnosis not present

## 2019-09-17 DIAGNOSIS — Z923 Personal history of irradiation: Secondary | ICD-10-CM | POA: Insufficient documentation

## 2019-09-17 DIAGNOSIS — D0512 Intraductal carcinoma in situ of left breast: Secondary | ICD-10-CM | POA: Diagnosis not present

## 2019-09-17 DIAGNOSIS — Z170 Estrogen receptor positive status [ER+]: Secondary | ICD-10-CM | POA: Insufficient documentation

## 2019-09-17 DIAGNOSIS — C50412 Malignant neoplasm of upper-outer quadrant of left female breast: Secondary | ICD-10-CM | POA: Insufficient documentation

## 2019-09-17 NOTE — Assessment & Plan Note (Signed)
05/21/2019:Screening mammogram detected an asymmetry and calcifications in the left breastcalcifications span 4.5 cm the palpable lump inside of that measured 1.7 cm. Diagnostic mammogram showed a 1.9cm mass and calcifications in the upper outer left breast.Biopsy revealed grade 2 invasive ductal carcinoma ER/PR positive HER-2 negative with a Ki-67 of 15%  07/29/2019:Left lumpectomy ([facility]): IDC, grade 1, 2.4cm, with low grade DCIS, 2 left axillary lymph nodes negative.  ER/PR positive HER-2 negative with a Ki-67 of 15% positive anterior and inferior margins  Treatment plan: Resection of the positive margins 1.  Adjuvant radiation therapy 08/26/19-09/17/19 2.  Followed by adjuvant antiestrogen therapy: anastrozole 1 mg daily Patient is not interested in chemotherapy and therefore Oncotype DX will not be obtained.  Anastrozole counseling:We discussed the risks and benefits of anti-estrogen therapy with aromatase inhibitors. These include but not limited to insomnia, hot flashes, mood changes, vaginal dryness, bone density loss, and weight gain. We strongly believe that the benefits far outweigh the risks. Patient understands these risks and consented to starting treatment. Planned treatment duration is 5-7 years.

## 2019-09-20 ENCOUNTER — Ambulatory Visit
Admission: RE | Admit: 2019-09-20 | Discharge: 2019-09-20 | Disposition: A | Discharge status: Home / Self Care | Payer: Medicare Other | Source: Ambulatory Visit | Attending: Radiation Oncology | Admitting: Radiation Oncology

## 2019-09-20 ENCOUNTER — Other Ambulatory Visit: Payer: Self-pay

## 2019-09-20 DIAGNOSIS — Z170 Estrogen receptor positive status [ER+]: Secondary | ICD-10-CM | POA: Diagnosis not present

## 2019-09-20 DIAGNOSIS — C50412 Malignant neoplasm of upper-outer quadrant of left female breast: Secondary | ICD-10-CM | POA: Diagnosis not present

## 2019-09-20 DIAGNOSIS — D0512 Intraductal carcinoma in situ of left breast: Secondary | ICD-10-CM | POA: Diagnosis not present

## 2019-09-20 DIAGNOSIS — Z510 Encounter for antineoplastic radiation therapy: Secondary | ICD-10-CM | POA: Diagnosis not present

## 2019-09-21 ENCOUNTER — Ambulatory Visit
Admission: RE | Admit: 2019-09-21 | Discharge: 2019-09-21 | Disposition: A | Discharge status: Home / Self Care | Payer: Medicare Other | Source: Ambulatory Visit | Attending: Radiation Oncology | Admitting: Radiation Oncology

## 2019-09-21 ENCOUNTER — Ambulatory Visit: Payer: Medicare Other | Admitting: Hematology and Oncology

## 2019-09-21 ENCOUNTER — Other Ambulatory Visit: Payer: Self-pay

## 2019-09-21 DIAGNOSIS — Z510 Encounter for antineoplastic radiation therapy: Secondary | ICD-10-CM | POA: Diagnosis not present

## 2019-09-21 DIAGNOSIS — D0512 Intraductal carcinoma in situ of left breast: Secondary | ICD-10-CM | POA: Diagnosis not present

## 2019-09-21 DIAGNOSIS — C50412 Malignant neoplasm of upper-outer quadrant of left female breast: Secondary | ICD-10-CM | POA: Diagnosis not present

## 2019-09-21 DIAGNOSIS — Z170 Estrogen receptor positive status [ER+]: Secondary | ICD-10-CM | POA: Diagnosis not present

## 2019-09-22 ENCOUNTER — Ambulatory Visit
Admission: RE | Admit: 2019-09-22 | Discharge: 2019-09-22 | Disposition: A | Discharge status: Home / Self Care | Payer: Medicare Other | Source: Ambulatory Visit | Attending: Radiation Oncology | Admitting: Radiation Oncology

## 2019-09-22 ENCOUNTER — Other Ambulatory Visit: Payer: Self-pay

## 2019-09-22 DIAGNOSIS — Z510 Encounter for antineoplastic radiation therapy: Secondary | ICD-10-CM | POA: Diagnosis not present

## 2019-09-22 DIAGNOSIS — Z170 Estrogen receptor positive status [ER+]: Secondary | ICD-10-CM | POA: Diagnosis not present

## 2019-09-22 DIAGNOSIS — D0512 Intraductal carcinoma in situ of left breast: Secondary | ICD-10-CM | POA: Diagnosis not present

## 2019-09-22 DIAGNOSIS — C50412 Malignant neoplasm of upper-outer quadrant of left female breast: Secondary | ICD-10-CM | POA: Diagnosis not present

## 2019-09-23 ENCOUNTER — Other Ambulatory Visit: Payer: Self-pay

## 2019-09-23 ENCOUNTER — Encounter: Payer: Self-pay | Admitting: Radiation Oncology

## 2019-09-23 ENCOUNTER — Encounter: Payer: Self-pay | Admitting: *Deleted

## 2019-09-23 ENCOUNTER — Ambulatory Visit
Admission: RE | Admit: 2019-09-23 | Discharge: 2019-09-23 | Disposition: A | Discharge status: Home / Self Care | Payer: Medicare Other | Source: Ambulatory Visit | Attending: Radiation Oncology | Admitting: Radiation Oncology

## 2019-09-23 DIAGNOSIS — D0512 Intraductal carcinoma in situ of left breast: Secondary | ICD-10-CM | POA: Diagnosis not present

## 2019-09-23 DIAGNOSIS — Z170 Estrogen receptor positive status [ER+]: Secondary | ICD-10-CM | POA: Diagnosis not present

## 2019-09-23 DIAGNOSIS — Z510 Encounter for antineoplastic radiation therapy: Secondary | ICD-10-CM | POA: Diagnosis not present

## 2019-09-23 DIAGNOSIS — C50412 Malignant neoplasm of upper-outer quadrant of left female breast: Secondary | ICD-10-CM | POA: Diagnosis not present

## 2019-10-01 NOTE — Telephone Encounter (Signed)
Called and spoke with Julie Rivera. Scheduled Follow up appointment for November 03, 2019.

## 2019-10-04 ENCOUNTER — Telehealth: Payer: Self-pay | Admitting: Adult Health

## 2019-10-04 NOTE — Telephone Encounter (Signed)
Scheduled per 06/14 los, patient has been called and notified.  

## 2019-10-14 DIAGNOSIS — M85852 Other specified disorders of bone density and structure, left thigh: Secondary | ICD-10-CM | POA: Diagnosis not present

## 2019-10-14 DIAGNOSIS — M85851 Other specified disorders of bone density and structure, right thigh: Secondary | ICD-10-CM | POA: Diagnosis not present

## 2019-10-14 DIAGNOSIS — R922 Inconclusive mammogram: Secondary | ICD-10-CM | POA: Diagnosis not present

## 2019-10-14 DIAGNOSIS — Z853 Personal history of malignant neoplasm of breast: Secondary | ICD-10-CM | POA: Diagnosis not present

## 2019-10-18 NOTE — Progress Notes (Incomplete)
  Patient Name: Julie Rivera MRN: [id number] DOB: Jul 11, 1945 Referring Physician: Stark Klein (Profile Not Attached) Date of Service: 09/23/2019 [facility], [place]                                                        End Of Treatment Note  Diagnoses: C50.412-Malignant neoplasm of upper-outer quadrant of left female breast  Cancer Staging: StageIA (pT2, pN0), Left BreastUOQ,Invasive DuctalCarcinoma with low-grade DCIS, ER+/ PR+/ Her2-, Grade1  Intent: Curative  Radiation Treatment Dates: 08/25/2019 through 09/23/2019 Site Technique Total Dose (Gy) Dose per Fx (Gy) Completed Fx Beam Energies  Breast, Left: Breast_Lt 3D 40.05/40.05 2.67 15/15 6X  Breast, Left: Breast_Lt_Bst 3D 12/12 2 6/6 6X, 10X   Narrative: The patient tolerated radiation therapy relatively well. She did report some pressure in her left breast. She denied fatigue and left breast pain. The left breast area showed minimal hyperpigmentation changes and erythema that developed into radiation dermatitis in the upper inner aspect without skin breakdown. Overall, the skin reaction had decreased at the end of treatment.  Plan: The patient will follow-up with radiation oncology in one month.  ________________________________________________   Blair Promise, PhD, MD  This document serves as a record of services personally performed by Gery Pray, MD. It was created on his behalf by Clerance Lav, a trained medical scribe. The creation of this record is based on the scribe's personal observations and the provider's statements to them. This document has been checked and approved by the attending provider.

## 2019-10-27 ENCOUNTER — Telehealth: Payer: Self-pay | Admitting: Hematology and Oncology

## 2019-10-27 MED ORDER — ANASTROZOLE 1 MG PO TABS: 1.0000 mg | ORAL_TABLET | Freq: Every day | ORAL | 3 refills | Status: DC

## 2019-10-27 NOTE — Telephone Encounter (Signed)
Bone density at [facility]: T score -1.8 and 10/14/2019 I recommended anastrozole. She wanted to know if she needs to take something for hot flashes because it is keeping her awake at night. We discussed the pros and cons of Effexor and she decided that she wants to hold off on it and take Ambien intermittently as needed. She will come back in September for survivorship clinic

## 2019-10-28 ENCOUNTER — Ambulatory Visit: Payer: Self-pay | Admitting: Radiation Oncology

## 2019-10-31 NOTE — Progress Notes (Signed)
Radiation Oncology         [phone]) [phone] ________________________________  Name: Julie Rivera MRN: [id number]  Date: 11/01/2019  DOB: 26-Mar-1946  Follow-Up Visit Note  CC: Burnard Hawthorne, FNP  Nicholas Lose, MD    ICD-10-CM   1. Malignant neoplasm of upper-outer quadrant of left breast in female, estrogen receptor positive ([place])  C50.412    Z17.0     Diagnosis: StageIA (pT2, pN0), Left BreastUOQ,Invasive DuctalCarcinoma with low-grade DCIS, ER+/ PR+/ Her2-, Grade1  Interval Since Last Radiation: One month, one week, and two days.  Radiation Treatment Dates: 08/25/2019 through 09/23/2019 Site Technique Total Dose (Gy) Dose per Fx (Gy) Completed Fx Beam Energies  Breast, Left: Breast_Lt 3D 40.05/40.05 2.67 15/15 6X  Breast, Left: Breast_Lt_Bst 3D 12/12 2 6/6 6X, 10X    Narrative:  The patient returns today for routine follow-up. Bone density scan performed at [facility] on 10/14/2019 showed a T score of 1.8. Dr. Lindi Adie recommended that the patient begin Anastrozole. She was also offered Effexor for her hot flashes, which she declined at the time and stated that she would rather take Ambien intermittently as needed.                         On review of systems, she reports no complaints at this time. She denies pain, fatigue, skin changes, lymphedema, and difficulty with range of motion.  ALLERGIES:  has No Known Allergies.  Meds: Current Outpatient Medications  Medication Sig Dispense Refill  . anastrozole (ARIMIDEX) 1 MG tablet Take 1 tablet (1 mg total) by mouth daily. 90 tablet 3  . zolpidem (AMBIEN) 10 MG tablet TAKE ONE TABLET AT BEDTIME AS NEEDED 30 tablet 0   No current facility-administered medications for this encounter.    Physical Findings: The patient is in no acute distress. Patient is alert and oriented.  height is _0  (1.753 m) and weight is 140 lb 3.2 oz (63.6 kg). Her temperature is 98 F (36.7 C). Her blood pressure is 121/73 and her pulse is 69.  Her respiration is 18 and oxygen saturation is 99%.   Lungs are clear to auscultation bilaterally. Heart has regular rate and rhythm. No palpable cervical, supraclavicular, or axillary adenopathy. Abdomen soft, non-tender, normal bowel sounds. Right breast: No palpable mass, nipple discharge, or bleeding.  Left breast: Skin is healed well.  Mild hyperpigmentation changes noted.  Mild edema noted in the breast.  No dominant mass appreciated in the breast nipple discharge or bleeding.  Lab Findings: Lab Results  Component Value Date   WBC 4.1 07/26/2019   HGB 14.2 07/26/2019   HCT 43.6 07/26/2019   MCV 92.4 07/26/2019   PLT 256 07/26/2019    Radiographic Findings: No results found.  Impression: StageIA (pT2, pN0), Left BreastUOQ,Invasive DuctalCarcinoma with low-grade DCIS, ER+/ PR+/ Her2-, Grade1  The patient has recovered well from her radiation therapy.  No residual side effects noted today  Plan: The patient is scheduled to follow-up with Wilber Bihari, NP, on 12/17/2019 for survivorship clinic. She will follow-up with radiation oncology on an as needed basis.  She will continue to follow-up with Dr. Lindi Adie and remain on anastrozole.    ____________________________________   Blair Promise, PhD, MD  This document serves as a record of services personally performed by Gery Pray, MD. It was created on his behalf by Clerance Lav, a trained medical scribe. The creation of this record is based on the scribe's personal observations and  the provider's statements to them. This document has been checked and approved by the attending provider.

## 2019-11-01 ENCOUNTER — Other Ambulatory Visit: Payer: Self-pay

## 2019-11-01 ENCOUNTER — Ambulatory Visit
Admission: RE | Admit: 2019-11-01 | Discharge: 2019-11-01 | Disposition: A | Discharge status: Home / Self Care | Payer: Medicare Other | Source: Ambulatory Visit | Attending: Radiation Oncology | Admitting: Radiation Oncology

## 2019-11-01 ENCOUNTER — Encounter: Payer: Self-pay | Admitting: Radiation Oncology

## 2019-11-01 DIAGNOSIS — Z170 Estrogen receptor positive status [ER+]: Secondary | ICD-10-CM | POA: Diagnosis not present

## 2019-11-01 DIAGNOSIS — R232 Flushing: Secondary | ICD-10-CM | POA: Insufficient documentation

## 2019-11-01 DIAGNOSIS — Z79811 Long term (current) use of aromatase inhibitors: Secondary | ICD-10-CM | POA: Insufficient documentation

## 2019-11-01 DIAGNOSIS — Z923 Personal history of irradiation: Secondary | ICD-10-CM | POA: Insufficient documentation

## 2019-11-01 DIAGNOSIS — C50412 Malignant neoplasm of upper-outer quadrant of left female breast: Secondary | ICD-10-CM | POA: Insufficient documentation

## 2019-11-01 NOTE — Progress Notes (Signed)
Patient here for a 1 month f/u visit. Patient denies any pain, fatigue or skin changes. She denies lymphedema, or ROM issues.  BP 121/73 (BP Location: Left Arm, Patient Position: Sitting, Cuff Size: Normal)   Pulse 69   Temp 98 F (36.7 C)   Resp 18   Ht 5\' 9"  (1.753 m)   Wt 140 lb 3.2 oz (63.6 kg)   LMP 02/07/2016   SpO2 99%   BMI 20.70 kg/m    Wt Readings from Last 3 Encounters:  11/01/19 140 lb 3.2 oz (63.6 kg)  09/17/19 138 lb 4.8 oz (62.7 kg)  08/19/19 138 lb 6 oz (62.8 kg)

## 2019-11-03 ENCOUNTER — Ambulatory Visit: Payer: Medicare Other | Admitting: Family

## 2019-11-12 ENCOUNTER — Ambulatory Visit (INDEPENDENT_AMBULATORY_CARE_PROVIDER_SITE_OTHER): Payer: Medicare Other | Admitting: Family

## 2019-11-12 ENCOUNTER — Other Ambulatory Visit: Payer: Self-pay

## 2019-11-12 DIAGNOSIS — G4700 Insomnia, unspecified: Secondary | ICD-10-CM | POA: Diagnosis not present

## 2019-11-12 NOTE — Assessment & Plan Note (Signed)
Insomnia due to vasomotor symptoms. Currently patient is well controlled on Ambien and taking as needed seems to be effective for her. We did discuss that she may trial gabapentin at home (old prescription), and if that is more effective, we may opt to choose gabapentin as needed instead of Ambien. Patient will let me know how she is doing. Counseled patient on side effects of Ambien including falls, excessive sedation. She understands not to take Ambien with alcohol. Will follow.

## 2019-11-12 NOTE — Patient Instructions (Addendum)
Try gabapentin 100mg  at bedtime  Let me know how you are doing!

## 2019-11-12 NOTE — Progress Notes (Signed)
Subjective:    Patient ID: Julie Rivera, female    DOB: 1945/07/05, 74 y.o.   MRN: [id number]  CC: [name] is a 74 y.o. female who presents today for follow up.   HPI: Feels well today. Here to follow-up on insomnia and hot flashes.   Ever since coming off of Premphase several months ago, hot flashes have worsened at night and interrupting her sleep. She will toss and turn and has trouble going back to sleep due to hot flashes. She does not want take medication every day for this. She would just like to have something as needed. Ambien is working well for her. She denies any sleepwalking weird dreams or excessive drowsiness the morning after. She feels her sleep is improved. She does note she has an old prescription for gabapentin that she had taken for back pain in the past.    Occasional alcohol  She is enjoying playing tennis and staying active. No depression. No chest pain. She declines any labs today.    Left breast cancer diagnosed 05/21/19- follows with radiation oncology and oncology ( dr Lindi Adie) Status post left lumpectomy currently undergoing radiation Discussed antiestrogen therapy and she has started arimidex.  Patient will not be doing chemotherapy.    DEXA done per patient ( unable to see in chart) HISTORY:  Past Medical History:  Diagnosis Date  . Actinic keratosis   . Allergy   . Basal cell carcinoma 03/22/2019   L nasal tip  . Basal cell carcinoma 03/26/2010   L lat upper chest, L chest infer clavicle  . Basal cell carcinoma 09/12/2009   L medial pretibial  . Chicken pox   . Kidney stones   . Squamous cell carcinoma of skin 04/22/2016   R ant thigh medial, R ant thigh lateral   Past Surgical History:  Procedure Laterality Date  . BACK SURGERY  2017  . BREAST LUMPECTOMY WITH RADIOACTIVE SEED AND SENTINEL LYMPH NODE BIOPSY Left 07/29/2019   Procedure: LEFT BREAST LUMPECTOMY WITH RADIOACTIVE SEED AND SENTINEL LYMPH NODE BIOPSY;  Surgeon: Stark Klein, MD;  Location: [facility];  Service: General;  Laterality: Left;  PEC BLOCK  . KNEE ARTHROCENTESIS    . RADIOACTIVE SEED GUIDED EXCISIONAL BREAST BIOPSY Right 07/29/2019   Procedure: RIGHT RADIOACTIVE SEED GUIDED EXCISIONAL BREAST BIOPSY;  Surgeon: Stark Klein, MD;  Location: [facility];  Service: General;  Laterality: Right;  PEC BLOCK  . SHOULDER SURGERY    . TONSILLECTOMY     Family History  Problem Relation Age of Onset  . Hypertension Sister   . Sudden death Neg Hx   . Hyperlipidemia Neg Hx   . Heart attack Neg Hx   . Diabetes Neg Hx   . Ovarian cancer Neg Hx   . Breast cancer Neg Hx     Allergies: Patient has no known allergies. Current Outpatient Medications on File Prior to Visit  Medication Sig Dispense Refill  . anastrozole (ARIMIDEX) 1 MG tablet Take 1 tablet (1 mg total) by mouth daily. 90 tablet 3  . zolpidem (AMBIEN) 10 MG tablet TAKE ONE TABLET AT BEDTIME AS NEEDED 30 tablet 0   No current facility-administered medications on file prior to visit.    Social History   Tobacco Use  . Smoking status: Never Smoker  . Smokeless tobacco: Never Used  Vaping Use  . Vaping Use: Never used  Substance Use Topics  . Alcohol use: No  . Drug use: No    Review  of Systems  Constitutional: Negative for chills, fever and unexpected weight change.  Respiratory: Negative for cough.   Cardiovascular: Negative for chest pain and palpitations.  Gastrointestinal: Negative for nausea and vomiting.  Neurological: Negative for dizziness and headaches.  Psychiatric/Behavioral: Positive for sleep disturbance.      Objective:    BP 130/72   Pulse 67   Temp 98.2 F (36.8 C) (Oral)   Resp 16   Wt 140 lb (63.5 kg)   LMP 02/07/2016   SpO2 98%   BMI 20.67 kg/m  BP Readings from Last 3 Encounters:  11/12/19 130/72  11/01/19 121/73  09/17/19 136/77   Wt Readings from Last 3 Encounters:  11/12/19 140 lb (63.5 kg)  11/01/19 140 lb 3.2 oz (63.6 kg)  09/17/19 138 lb 4.8 oz  (62.7 kg)    Physical Exam Vitals reviewed.  Constitutional:      Appearance: She is well-developed.  Eyes:     Conjunctiva/sclera: Conjunctivae normal.  Cardiovascular:     Rate and Rhythm: Normal rate and regular rhythm.     Pulses: Normal pulses.     Heart sounds: Normal heart sounds.  Pulmonary:     Effort: Pulmonary effort is normal.     Breath sounds: Normal breath sounds. No wheezing, rhonchi or rales.  Skin:    General: Skin is warm and dry.  Neurological:     Mental Status: She is alert.  Psychiatric:        Speech: Speech normal.        Behavior: Behavior normal.        Thought Content: Thought content normal.        Assessment & Plan:   Problem List Items Addressed This Visit      Other   Insomnia    Insomnia due to vasomotor symptoms. Currently patient is well controlled on Ambien and taking as needed seems to be effective for her. We did discuss that she may trial gabapentin at home (old prescription), and if that is more effective, we may opt to choose gabapentin as needed instead of Ambien. Patient will let me know how she is doing. Counseled patient on side effects of Ambien including falls, excessive sedation. She understands not to take Ambien with alcohol. Will follow.          I am having Darnelle Going. Mable "Vee" maintain her zolpidem and anastrozole.   No orders of the defined types were placed in this encounter.   Return precautions given.   Risks, benefits, and alternatives of the medications and treatment plan prescribed today were discussed, and patient expressed understanding.   Education regarding symptom management and diagnosis given to patient on AVS.  Continue to follow with Burnard Hawthorne, FNP for routine health maintenance.   Julie Rivera and I agreed with plan.   Mable Paris, FNP

## 2019-11-19 ENCOUNTER — Telehealth: Payer: Self-pay | Admitting: Family

## 2019-11-19 DIAGNOSIS — Z79890 Hormone replacement therapy (postmenopausal): Secondary | ICD-10-CM

## 2019-11-19 NOTE — Telephone Encounter (Signed)
Pt called to let Joycelyn Schmid know that she did want to start the gabapentin 100mg  at bedtimea

## 2019-11-23 MED ORDER — GABAPENTIN 100 MG PO CAPS: 100.0000 mg | ORAL_CAPSULE | Freq: Every evening | ORAL | 0 refills | Status: DC | PRN

## 2019-11-23 NOTE — Telephone Encounter (Signed)
Call pt I sent gabapentin 100mg  qhs

## 2019-11-23 NOTE — Telephone Encounter (Signed)
Patient verbalized understanding and had no further questions.

## 2019-11-23 NOTE — Addendum Note (Signed)
Addended by: Burnard Hawthorne on: 11/23/2019 10:05 AM   Modules accepted: Orders

## 2019-11-25 ENCOUNTER — Encounter: Payer: Self-pay | Admitting: Hematology and Oncology

## 2019-11-30 ENCOUNTER — Telehealth: Payer: Self-pay

## 2019-11-30 NOTE — Telephone Encounter (Signed)
Ok to switch to letrozole 2.5 mg daily (start initially at 1/2 tab daily for a month) Please send the prescription to her pharmacy (30 tabs) Set up a MyChart virtual visit in 1 month

## 2019-11-30 NOTE — Telephone Encounter (Signed)
Pt called stating she can no longer tolerate Anastrozole as it is causing her to have "no energy at all, and dizzy headed". Pt states she has left to go out of town until Friday 8/27 and would like to know if it is OK for her to stop Anastrozole now and start a new med Friday. Please advise.

## 2019-12-01 MED ORDER — LETROZOLE 2.5 MG PO TABS: ORAL_TABLET | ORAL | 0 refills | Status: DC

## 2019-12-01 NOTE — Telephone Encounter (Signed)
Notified of message below. Will stop anastrazole now per Dr Lindi Adie. Can start letrozole when she gets back in town.  Message to scheduler

## 2019-12-07 ENCOUNTER — Telehealth: Payer: Self-pay | Admitting: Adult Health

## 2019-12-07 ENCOUNTER — Telehealth: Payer: Self-pay | Admitting: Hematology and Oncology

## 2019-12-07 NOTE — Telephone Encounter (Signed)
Cancelled 9/10 appt per [facility] is out of the office. I left a voicemail with cancellation details and my direct number for pt to call back to rescheduled 9/10 SCP visit. I did not schedule anything because I would like to offer pt the option of doing a Tuesday morning virtual SCP visit per [facility] request.

## 2019-12-07 NOTE — Telephone Encounter (Signed)
Scheduled appt per 8/25 sch msg - left message for patient with appt date and time

## 2019-12-14 ENCOUNTER — Telehealth: Payer: Self-pay

## 2019-12-14 NOTE — Telephone Encounter (Signed)
RN spoke with patient regarding medication, Letrozole.    Pt with significant joint pain, and dizziness since starting medication.  Pt reports being unable to enjoy ADL's while on medication, such as walking or spending time with her grandchildren due to the joint pain.    Per MD recommendations, patient to stop Letrozole X 2 weeks to ensure symptoms subside.  Pt to report back to clinic and if symptoms are no longer present we will send Rx for Exemestane.   Pt verbalized understanding and agreement.

## 2019-12-17 ENCOUNTER — Encounter: Payer: Medicare Other | Admitting: Adult Health

## 2019-12-28 ENCOUNTER — Ambulatory Visit (INDEPENDENT_AMBULATORY_CARE_PROVIDER_SITE_OTHER): Payer: Medicare Other

## 2019-12-28 VITALS — Ht 69.0 in | Wt 140.0 lb

## 2019-12-28 DIAGNOSIS — Z0000 Encounter for general adult medical examination without abnormal findings: Secondary | ICD-10-CM

## 2019-12-28 NOTE — Progress Notes (Addendum)
Subjective:   Julie Rivera is a 74 y.o. female who presents for Medicare Annual (Subsequent) preventive examination.  Review of Systems    No ROS.  Medicare Wellness Virtual Visit.     Cardiac Risk Factors include: advanced age (>81men, >87 women)     Objective:    Today's Vitals   12/28/19 1105  Weight: 140 lb (63.5 kg)  Height: 5\' 9"  (1.753 m)   Body mass index is 20.67 kg/m.  Advanced Directives 12/28/2019 11/01/2019 08/19/2019 07/26/2019 05/26/2019 12/25/2018 11/11/2017  Does Patient Have a Medical Advance Directive? Yes Yes Yes Yes Yes Yes Yes  Type of Paramedic of [place];Living will [place];Living will [place];Living will Living will Healthcare Power of [place];Living will [place]  Does patient want to make changes to medical advance directive? No - Patient declined - No - Patient declined No - Patient declined No - Patient declined No - Patient declined No - Patient declined  Copy of [place] in Chart? No - copy requested - No - copy requested - No - copy requested No - copy requested No - copy requested  Would patient like information on creating a medical advance directive? - - - No - Patient declined - - -    Current Medications (verified) Outpatient Encounter Medications as of 12/28/2019  Medication Sig  . anastrozole (ARIMIDEX) 1 MG tablet Take 1 tablet (1 mg total) by mouth daily.  Marland Kitchen gabapentin (NEURONTIN) 100 MG capsule Take 1 capsule (100 mg total) by mouth at bedtime as needed.  Marland Kitchen letrozole (FEMARA) 2.5 MG tablet Take 1/2 tablet daily for 1 month  . zolpidem (AMBIEN) 10 MG tablet TAKE ONE TABLET AT BEDTIME AS NEEDED   No facility-administered encounter medications on file as of 12/28/2019.    Allergies (verified) Patient has no known allergies.   History: Past Medical History:  Diagnosis Date  . Actinic keratosis   .  Allergy   . Basal cell carcinoma 03/22/2019   L nasal tip  . Basal cell carcinoma 03/26/2010   L lat upper chest, L chest infer clavicle  . Basal cell carcinoma 09/12/2009   L medial pretibial  . Chicken pox   . Kidney stones   . Squamous cell carcinoma of skin 04/22/2016   R ant thigh medial, R ant thigh lateral   Past Surgical History:  Procedure Laterality Date  . BACK SURGERY  2017  . BREAST LUMPECTOMY WITH RADIOACTIVE SEED AND SENTINEL LYMPH NODE BIOPSY Left 07/29/2019   Procedure: LEFT BREAST LUMPECTOMY WITH RADIOACTIVE SEED AND SENTINEL LYMPH NODE BIOPSY;  Surgeon: Stark Klein, MD;  Location: [facility];  Service: General;  Laterality: Left;  PEC BLOCK  . KNEE ARTHROCENTESIS    . RADIOACTIVE SEED GUIDED EXCISIONAL BREAST BIOPSY Right 07/29/2019   Procedure: RIGHT RADIOACTIVE SEED GUIDED EXCISIONAL BREAST BIOPSY;  Surgeon: Stark Klein, MD;  Location: [facility];  Service: General;  Laterality: Right;  PEC BLOCK  . SHOULDER SURGERY    . TONSILLECTOMY     Family History  Problem Relation Age of Onset  . Hypertension Sister   . Sudden death Neg Hx   . Hyperlipidemia Neg Hx   . Heart attack Neg Hx   . Diabetes Neg Hx   . Ovarian cancer Neg Hx   . Breast cancer Neg Hx    Social History   Socioeconomic History  . Marital status: Married  Spouse name: Not on file  . Number of children: Not on file  . Years of education: Not on file  . Highest education level: Not on file  Occupational History  . Not on file  Tobacco Use  . Smoking status: Never Smoker  . Smokeless tobacco: Never Used  Vaping Use  . Vaping Use: Never used  Substance and Sexual Activity  . Alcohol use: No  . Drug use: No  . Sexual activity: Not on file  Other Topics Concern  . Not on file  Social History Narrative   Production assistant, radio, retired      2 children      4 grandchildren            Social Determinants of Health   Financial Resource Strain: [place]   . Difficulty of Paying Living  Expenses: Not hard at all  Food Insecurity: No Food Insecurity  . Worried About Charity fundraiser in the Last Year: Never true  . Ran Out of Food in the Last Year: Never true  Transportation Needs: No Transportation Needs  . Lack of Transportation (Medical): No  . Lack of Transportation (Non-Medical): No  Physical Activity:   . Days of Exercise per Week: Not on file  . Minutes of Exercise per Session: Not on file  Stress: No Stress Concern Present  . Feeling of Stress : Not at all  Social Connections: Unknown  . Frequency of Communication with Friends and Family: Not on file  . Frequency of Social Gatherings with Friends and Family: Not on file  . Attends Religious Services: Not on file  . Active Member of Clubs or Organizations: Not on file  . Attends Archivist Meetings: Not on file  . Marital Status: Married    Tobacco Counseling Counseling given: Not Answered   Clinical Intake:  Pre-visit preparation completed: Yes           How often do you need to have someone help you when you read instructions, pamphlets, or other written materials from your doctor or pharmacy?: 1 - Never Interpreter Needed?: No      Activities of Daily Living In your present state of health, do you have any difficulty performing the following activities: 12/28/2019 07/26/2019  Hearing? N N  Vision? N N  Difficulty concentrating or making decisions? N N  Walking or climbing stairs? N N  Dressing or bathing? N N  Doing errands, shopping? N N  Preparing Food and eating ? N -  Using the Toilet? N -  In the past six months, have you accidently leaked urine? N -  Do you have problems with loss of bowel control? N -  Managing your Medications? N -  Managing your Finances? N -  Housekeeping or managing your Housekeeping? N -  Some recent data might be hidden    Patient Care Team: Burnard Hawthorne, FNP as PCP - General (Family Medicine) Mauro Kaufmann, RN as Oncology Nurse  Navigator Rockwell Germany, RN as Oncology Nurse Navigator Stark Klein, MD as Consulting Physician (General Surgery) Nicholas Lose, MD as Consulting Physician (Hematology and Oncology) Gery Pray, MD as Consulting Physician (Radiation Oncology)  Indicate any recent [place] you may have received from other than [facility] providers in the past year (date may be approximate).     Assessment:   This is a routine wellness examination for [place].  I connected with [place] today by telephone and verified that I am speaking with  the correct person using two identifiers. Location patient: home Location provider: work Persons participating in the virtual visit: patient, Marine scientist.    I discussed the limitations, risks, security and privacy concerns of performing an evaluation and management service by telephone and the availability of in person appointments. The patient expressed understanding and verbally consented to this telephonic visit.    Interactive audio and video telecommunications were attempted between this provider and patient, however failed, due to patient having technical difficulties OR patient did not have access to video capability.  We continued and completed visit with audio only.  Some vital signs may be absent or patient reported.   Hearing/Vision screen  Hearing Screening   125Hz  250Hz  500Hz  1000Hz  2000Hz  3000Hz  4000Hz  6000Hz  8000Hz   Right ear:           Left ear:           Comments: Patient is able to hear conversational tones without difficulty.  No issues reported.  Vision Screening Comments:  Wears corrective lenses Cataract extraction, bilateral Visual acuity not assessed, virtual visit.  They have seen their ophthalmologist in the last 12 months.     Dietary issues and exercise activities discussed:    Goals    . Follow up with provider as needed      Depression Screen PHQ 2/9 Scores 12/28/2019 12/25/2018 11/11/2017 07/01/2016 03/14/2016  PHQ - 2 Score  0 0 0 0 0    Fall Risk Fall Risk  12/28/2019 12/25/2018 11/11/2017 07/01/2016 03/14/2016  Falls in the past year? 0 0 No No No  Number falls in past yr: 0 - - - -  Follow up Falls evaluation completed - - - -   Handrails in use when climbing stairs? Yes Home free of loose throw rugs in walkways, pet beds, electrical cords, etc? Yes  Adequate lighting in your home to reduce risk of falls? Yes   ASSISTIVE DEVICES UTILIZED TO PREVENT FALLS: Use of a cane, walker or w/c? No    TIMED UP AND GO:  Was the test performed? No .   Cognitive Function: Patient is alert and oriented x3.  Denies difficulty focusing, memory loss, making decisions.   MMSE - Mini Mental State Exam 11/11/2017  Orientation to time 5  Orientation to Place 5  Registration 3  Attention/ Calculation 5  Recall 3  Language- name 2 objects 2  Language- repeat 1  Language- follow 3 step command 3  Language- read & follow direction 1  Write a sentence 1  Copy design 1  Total score 30     6CIT Screen 12/25/2018  What Year? 0 points  What month? 0 points  What time? 0 points  Count back from 20 0 points  Months in reverse 0 points  Repeat phrase 0 points  Total Score 0    Immunizations Immunization History  Administered Date(s) Administered  . DTaP 07/05/2016  . Influenza, High Dose Seasonal PF 12/17/2016  . Influenza-Unspecified 12/13/2017, 01/20/2018, 12/30/2018  . PFIZER SARS-COV-2 Vaccination 04/29/2019, 05/20/2019  . Pneumococcal Polysaccharide-23 07/05/2016  . Tdap 07/05/2016  . Zoster 01/20/2017  . Zoster Recombinat (Shingrix) 12/12/2017, 02/16/2018   Health Maintenance Health Maintenance  Topic Date Due  . INFLUENZA VACCINE  02/12/2020 (Originally 11/07/2019)  . PNA vac Low Risk Adult (2 of 2 - PCV13) 12/27/2020 (Originally 07/05/2017)  . MAMMOGRAM  10/13/2021  . COLONOSCOPY  04/08/2024  . TETANUS/TDAP  07/06/2026  . COVID-19 Vaccine  Completed  . DEXA SCAN  Discontinued  .  Hepatitis C Screening   Discontinued  Prevnar 13- plans to receive at local pharmacy. Agrees to update immunization record upon completion.   Dental Screening: Recommended annual dental exams for proper oral hygiene  Community Resource Referral / Chronic Care Management: CRR required this visit?  No   CCM required this visit?  No      Plan:    I have personally reviewed and noted the following in the patient's chart:   . Medical and social history . Use of alcohol, tobacco or illicit drugs  . Current medications and supplements . Functional ability and status . Nutritional status . Physical activity . Advanced directives . List of other physicians . Hospitalizations, surgeries, and ER visits in previous 12 months . Vitals . Screenings to include cognitive, depression, and falls . Referrals and appointments  In addition, I have reviewed and discussed with patient certain preventive protocols, quality metrics, and best practice recommendations. A written personalized care plan for preventive services as well as general preventive health recommendations were provided to patient via mychart.     Varney Biles, LPN   3/58/2518     Agree with plan. Mable Paris, NP

## 2019-12-28 NOTE — Patient Instructions (Addendum)
[name] , Thank you for taking time to come for your Medicare Wellness Visit. I appreciate your ongoing commitment to your health goals. Please review the following plan we discussed and let me know if I can assist you in the future.   These are the goals we discussed: Goals    . Follow up with provider as needed       This is a list of the screening recommended for you and due dates:  Health Maintenance  Topic Date Due  . Flu Shot  02/12/2020*  . Pneumonia vaccines (2 of 2 - PCV13) 12/27/2020*  . Mammogram  10/13/2021  . Colon Cancer Screening  04/08/2024  . Tetanus Vaccine  07/06/2026  . COVID-19 Vaccine  Completed  . DEXA scan (bone density measurement)  Discontinued  .  Hepatitis C: One time screening is recommended by Center for Disease Control  (CDC) for  adults born from [birth year] through 1964.   Discontinued  *Topic was postponed. The date shown is not the original due date.   Immunizations Immunization History  Administered Date(s) Administered  . DTaP 07/05/2016  . Influenza, High Dose Seasonal PF 12/17/2016  . Influenza-Unspecified 12/13/2017, 01/20/2018, 12/30/2018  . PFIZER SARS-COV-2 Vaccination 04/29/2019, 05/20/2019  . Pneumococcal Polysaccharide-23 07/05/2016  . Tdap 07/05/2016  . Zoster 01/20/2017  . Zoster Recombinat (Shingrix) 12/12/2017, 02/16/2018   Advanced directives: End of life planning; Advance aging; Advanced directives discussed.  Copy of current HCPOA/Living Will requested.    Conditions/risks identified: none new.  Follow up in one year for your annual wellness visit.   Preventive Care 73 Years and Older, Female Preventive care refers to lifestyle choices and visits with your health care provider that can promote health and wellness. What does preventive care include?  A yearly physical exam. This is also called an annual well check.  Dental exams once or twice a year.  Routine eye exams. Ask your health care provider how often you  should have your eyes checked.  Personal lifestyle choices, including:  Daily care of your teeth and gums.  Regular physical activity.  Eating a healthy diet.  Avoiding tobacco and drug use.  Limiting alcohol use.  Practicing safe sex.  Taking low-dose aspirin every day.  Taking vitamin and mineral supplements as recommended by your health care provider. What happens during an annual well check? The services and screenings done by your health care provider during your annual well check will depend on your age, overall health, lifestyle risk factors, and family history of disease. Counseling  Your health care provider may ask you questions about your:  Alcohol use.  Tobacco use.  Drug use.  Emotional well-being.  Home and relationship well-being.  Sexual activity.  Eating habits.  History of falls.  Memory and ability to understand (cognition).  Work and work Statistician.  Reproductive health. Screening  You may have the following tests or measurements:  Height, weight, and BMI.  Blood pressure.  Lipid and cholesterol levels. These may be checked every 5 years, or more frequently if you are over 44 years old.  Skin check.  Lung cancer screening. You may have this screening every year starting at age 84 if you have a 30-pack-year history of smoking and currently smoke or have quit within the past 15 years.  Fecal occult blood test (FOBT) of the stool. You may have this test every year starting at age 59.  Flexible sigmoidoscopy or colonoscopy. You may have a sigmoidoscopy every 5 years or a  colonoscopy every 10 years starting at age 33.  Hepatitis C blood test.  Hepatitis B blood test.  Sexually transmitted disease (STD) testing.  Diabetes screening. This is done by checking your blood sugar (glucose) after you have not eaten for a while (fasting). You may have this done every 1-3 years.  Bone density scan. This is done to screen for osteoporosis.  You may have this done starting at age 16.  Mammogram. This may be done every 1-2 years. Talk to your health care provider about how often you should have regular mammograms. Talk with your health care provider about your test results, treatment options, and if necessary, the need for more tests. Vaccines  Your health care provider may recommend certain vaccines, such as:  Influenza vaccine. This is recommended every year.  Tetanus, diphtheria, and acellular pertussis (Tdap, Td) vaccine. You may need a Td booster every 10 years.  Zoster vaccine. You may need this after age 52.  Pneumococcal 13-valent conjugate (PCV13) vaccine. One dose is recommended after age 69.  Pneumococcal polysaccharide (PPSV23) vaccine. One dose is recommended after age 25. Talk to your health care provider about which screenings and vaccines you need and how often you need them. This information is not intended to replace advice given to you by your health care provider. Make sure you discuss any questions you have with your health care provider. Document Released: 04/21/2015 Document Revised: 12/13/2015 Document Reviewed: 01/24/2015 Elsevier Interactive Patient Education  2017 [place] Prevention in the Home Falls can cause injuries. They can happen to people of all ages. There are many things you can do to make your home safe and to help prevent falls. What can I do on the outside of my home?  Regularly fix the edges of walkways and driveways and fix any cracks.  Remove anything that might make you trip as you walk through a door, such as a raised step or threshold.  Trim any bushes or trees on the path to your home.  Use bright outdoor lighting.  Clear any walking paths of anything that might make someone trip, such as rocks or tools.  Regularly check to see if handrails are loose or broken. Make sure that both sides of any steps have handrails.  Any raised decks and porches should have  guardrails on the edges.  Have any leaves, snow, or ice cleared regularly.  Use sand or salt on walking paths during winter.  Clean up any spills in your garage right away. This includes oil or grease spills. What can I do in the bathroom?  Use night lights.  Install grab bars by the toilet and in the tub and shower. Do not use towel bars as grab bars.  Use non-skid mats or decals in the tub or shower.  If you need to sit down in the shower, use a plastic, non-slip stool.  Keep the floor dry. Clean up any water that spills on the floor as soon as it happens.  Remove soap buildup in the tub or shower regularly.  Attach bath mats securely with double-sided non-slip rug tape.  Do not have throw rugs and other things on the floor that can make you trip. What can I do in the bedroom?  Use night lights.  Make sure that you have a light by your bed that is easy to reach.  Do not use any sheets or blankets that are too big for your bed. They should not hang down onto the  floor.  Have a firm chair that has side arms. You can use this for support while you get dressed.  Do not have throw rugs and other things on the floor that can make you trip. What can I do in the kitchen?  Clean up any spills right away.  Avoid walking on wet floors.  Keep items that you use a lot in easy-to-reach places.  If you need to reach something above you, use a strong step stool that has a grab bar.  Keep electrical cords out of the way.  Do not use floor polish or wax that makes floors slippery. If you must use wax, use non-skid floor wax.  Do not have throw rugs and other things on the floor that can make you trip. What can I do with my stairs?  Do not leave any items on the stairs.  Make sure that there are handrails on both sides of the stairs and use them. Fix handrails that are broken or loose. Make sure that handrails are as long as the stairways.  Check any carpeting to make sure that  it is firmly attached to the stairs. Fix any carpet that is loose or worn.  Avoid having throw rugs at the top or bottom of the stairs. If you do have throw rugs, attach them to the floor with carpet tape.  Make sure that you have a light switch at the top of the stairs and the bottom of the stairs. If you do not have them, ask someone to add them for you. What else can I do to help prevent falls?  Wear shoes that:  Do not have high heels.  Have rubber bottoms.  Are comfortable and fit you well.  Are closed at the toe. Do not wear sandals.  If you use a stepladder:  Make sure that it is fully opened. Do not climb a closed stepladder.  Make sure that both sides of the stepladder are locked into place.  Ask someone to hold it for you, if possible.  Clearly mark and make sure that you can see:  Any grab bars or handrails.  First and last steps.  Where the edge of each step is.  Use tools that help you move around (mobility aids) if they are needed. These include:  Canes.  Walkers.  Scooters.  Crutches.  Turn on the lights when you go into a dark area. Replace any light bulbs as soon as they burn out.  Set up your furniture so you have a clear path. Avoid moving your furniture around.  If any of your floors are uneven, fix them.  If there are any pets around you, be aware of where they are.  Review your medicines with your doctor. Some medicines can make you feel dizzy. This can increase your chance of falling. Ask your doctor what other things that you can do to help prevent falls. This information is not intended to replace advice given to you by your health care provider. Make sure you discuss any questions you have with your health care provider. Document Released: 01/19/2009 Document Revised: 08/31/2015 Document Reviewed: 04/29/2014 Elsevier Interactive Patient Education  2017 [employer].

## 2019-12-29 DIAGNOSIS — N8111 Cystocele, midline: Secondary | ICD-10-CM | POA: Diagnosis not present

## 2019-12-29 DIAGNOSIS — N812 Incomplete uterovaginal prolapse: Secondary | ICD-10-CM | POA: Diagnosis not present

## 2019-12-29 DIAGNOSIS — Z4689 Encounter for fitting and adjustment of other specified devices: Secondary | ICD-10-CM | POA: Diagnosis not present

## 2019-12-30 ENCOUNTER — Telehealth: Payer: Self-pay | Admitting: Family

## 2019-12-30 DIAGNOSIS — H2513 Age-related nuclear cataract, bilateral: Secondary | ICD-10-CM | POA: Diagnosis not present

## 2019-12-30 NOTE — Telephone Encounter (Signed)
I tried to call patient, but was unable to reach. We don't have record of her having more than one pneumonia vaccine. Pt needs to call [facility] to get dates & which one she had. Should be prevnar 13.

## 2019-12-30 NOTE — Telephone Encounter (Signed)
Pt states that she has in fact had her second pneumonia shot per [facility] pharm-FYI

## 2019-12-30 NOTE — Progress Notes (Signed)
HEMATOLOGY-ONCOLOGY MYCHART VIDEO VISIT PROGRESS NOTE  I connected with Julie Rivera on 12/31/2019 at 11:15 AM EDT by MyChart video conference and verified that I am speaking with the correct person using two identifiers.  After multiple attempts we discontinued the MyChart virtual visit and I performed a telephone visit I discussed the limitations, risks, security and privacy concerns of performing an evaluation and management service by MyChart and the availability of in person appointments.  I also discussed with the patient that there may be a patient responsible charge related to this service. The patient expressed understanding and agreed to proceed.  Patient's Location: Home Physician Location: Clinic  CHIEF COMPLIANT: Follow-up of left breast cancer on anastrozole  INTERVAL HISTORY: Julie Rivera is a 74 y.o. female with above-mentioned history of left breast cancer who underwent a left lumpectomy, radiation, and is currently on antiestrogen therapy with anastrozole. Mammogram on 10/14/19 showed no evidence of malignancy bilaterally. Bone density scan on 10/14/19 showed osteopenia with a T-score of -1.8 at the [facility]. She presents over MyChart today for follow-up.  She could not tolerate letrozole and therefore she stopped it 12/19/2019.  She had profound knee pain and discomfort.  She also had hot flashes, insomnia.  Insomnia is much better with Ambien that she takes at bedtime.  Oncology History  Malignant neoplasm of upper-outer quadrant of left breast in female, estrogen receptor positive ([place])  05/21/2019 Initial Diagnosis   Screening mammogram detected an asymmetry and calcifications 4.5 cm in the left breast. Diagnostic mammogram showed a 1.9cm mass and calcifications in the upper outer left breast.  Biopsy revealed grade 2 IDC ER/PR positive HER-2 negative with a Ki-67 of 5%   07/29/2019 Surgery   Left lumpectomy [facility]) ([id number]): IDC, grade 1, 2.4cm, with low grade  DCIS, 2 left axillary lymph nodes negative. Broadly present at the anterior margin of specimen B and focally present at the inferior margin of specimen B. Focally present at the lateral margin of specimen B.   07/29/2019 Cancer Staging   Staging form: Breast, AJCC 8th Edition - Pathologic stage from 07/29/2019: Stage IA (pT2, pN0, cM0, G1, ER+, PR+, HER2-)   08/25/2019 - 09/23/2019 Radiation Therapy   The patient initially received a dose of 40.05 Gy in 15 fractions to the breast using whole-breast tangent fields. This was delivered using a 3-D conformal technique. The pt received a boost delivering an additional 12 Gy in 6 fractions using a electron boost with 80mV electrons. The total dose was 52.05 Gy.   10/2019 -  Anti-estrogen oral therapy   Anastrozole     Observations/Objective:  There were no vitals filed for this visit. There is no height or weight on file to calculate BMI.  I have reviewed the data as listed CMP Latest Ref Rng & Units 05/26/2019 01/12/2018 11/10/2017  Glucose 70 - 99 mg/dL 62(L) 76 -  BUN 8 - 23 mg/dL 15 16 -  Creatinine 0.44 - 1.00 mg/dL 0.77 0.71 0.80  Sodium 135 - 145 mmol/L 143 139 -  Potassium 3.5 - 5.1 mmol/L 3.8 4.5 -  Chloride 98 - 111 mmol/L 107 104 -  CO2 22 - 32 mmol/L 28 29 -  Calcium 8.9 - 10.3 mg/dL 9.5 9.8 -  Total Protein 6.5 - 8.1 g/dL 6.8 6.6 -  Total Bilirubin 0.3 - 1.2 mg/dL 0.5 0.5 -  Alkaline Phos 38 - 126 U/L 59 46 -  AST 15 - 41 U/L 16 15 -  ALT 0 -  44 U/L 13 9 -    Lab Results  Component Value Date   WBC 4.1 07/26/2019   HGB 14.2 07/26/2019   HCT 43.6 07/26/2019   MCV 92.4 07/26/2019   PLT 256 07/26/2019   NEUTROABS 2.3 05/26/2019      Assessment Plan:  Malignant neoplasm of upper-outer quadrant of left breast in female, estrogen receptor positive ([place]) 05/21/2019:Screening mammogram detected an asymmetry and calcifications in the left breastcalcifications span 4.5 cm the palpable lump inside of that measured 1.7 cm.  Diagnostic mammogram showed a 1.9cm mass and calcifications in the upper outer left breast.Biopsy revealed grade 2 invasive ductal carcinoma ER/PR positive HER-2 negative with a Ki-67 of 15%  07/29/2019:Left lumpectomy ([facility]): IDC, grade 1, 2.4cm, with low grade DCIS, 2 left axillary lymph nodes negative.ER/PR positive HER-2 negative with a Ki-67 of 15% positive anterior and inferior margins  Treatment plan: Resection of the positive margins 1.Adjuvant radiation therapy 08/26/19-09/17/19 2.Followed by adjuvant antiestrogen therapy: anastrozole 1 mg daily  switched to letrozole stopped 12/19/19 changed to Tamoxifen 12/31/19 Patient is not interested in chemotherapy and therefore Oncotype DX will not be obtained.  Letrozole toxicities:Worse hot flashes and insomnia, severe knee pain and swelling, extremely tired and weak Switch to Tamoxifen 1/2 tab daily for 2 weeks and then to increase to full tab after  Breast cancer surveillance:  Mammogram 10/14/2019: [facility]: Benign Breast exam: Benign Bone density 10/14/2019: T score -1.8  Return to clinic in 1 year for follow-up  I discussed the assessment and treatment plan with the patient. The patient was provided an opportunity to ask questions and all were answered. The patient agreed with the plan and demonstrated an understanding of the instructions. The patient was advised to call back or seek an in-person evaluation if the symptoms worsen or if the condition fails to improve as anticipated.   We spent 6mnutes telephone visit during this encounter  VRulon Eisenmenger MD 12/31/2019   I, MCloyde ReamsDorshimer, am acting as scribe for VNicholas Lose MD.  I have reviewed the above documentation for accuracy and completeness, and I agree with the above.

## 2019-12-31 ENCOUNTER — Inpatient Hospital Stay: Payer: Medicare Other | Attending: Hematology and Oncology | Admitting: Hematology and Oncology

## 2019-12-31 DIAGNOSIS — Z170 Estrogen receptor positive status [ER+]: Secondary | ICD-10-CM | POA: Diagnosis not present

## 2019-12-31 DIAGNOSIS — C50412 Malignant neoplasm of upper-outer quadrant of left female breast: Secondary | ICD-10-CM

## 2019-12-31 MED ORDER — TAMOXIFEN CITRATE 20 MG PO TABS: 20.0000 mg | ORAL_TABLET | Freq: Every day | ORAL | 3 refills | Status: DC

## 2019-12-31 NOTE — Assessment & Plan Note (Signed)
05/21/2019:Screening mammogram detected an asymmetry and calcifications in the left breastcalcifications span 4.5 cm the palpable lump inside of that measured 1.7 cm. Diagnostic mammogram showed a 1.9cm mass and calcifications in the upper outer left breast.Biopsy revealed grade 2 invasive ductal carcinoma ER/PR positive HER-2 negative with a Ki-67 of 15%  07/29/2019:Left lumpectomy ([facility]): IDC, grade 1, 2.4cm, with low grade DCIS, 2 left axillary lymph nodes negative.ER/PR positive HER-2 negative with a Ki-67 of 15% positive anterior and inferior margins  Treatment plan: Resection of the positive margins 1.Adjuvant radiation therapy 08/26/19-09/17/19 2.Followed by adjuvant antiestrogen therapy: anastrozole 1 mg daily  switched to letrozole 12/31/2019 Patient is not interested in chemotherapy and therefore Oncotype DX will not be obtained.  Letrozole toxicities:  Breast cancer surveillance:  Mammogram 10/14/2019: [facility]: Benign Breast exam 12/31/2019: Benign Bone density 10/14/2019: T score -1.8  Return to clinic in 1 year for follow-up

## 2020-01-03 ENCOUNTER — Other Ambulatory Visit: Payer: Self-pay

## 2020-01-03 ENCOUNTER — Ambulatory Visit (INDEPENDENT_AMBULATORY_CARE_PROVIDER_SITE_OTHER): Payer: Medicare Other | Admitting: Dermatology

## 2020-01-03 DIAGNOSIS — Z85828 Personal history of other malignant neoplasm of skin: Secondary | ICD-10-CM | POA: Diagnosis not present

## 2020-01-03 DIAGNOSIS — I781 Nevus, non-neoplastic: Secondary | ICD-10-CM

## 2020-01-03 DIAGNOSIS — D485 Neoplasm of uncertain behavior of skin: Secondary | ICD-10-CM | POA: Diagnosis not present

## 2020-01-03 DIAGNOSIS — L820 Inflamed seborrheic keratosis: Secondary | ICD-10-CM | POA: Diagnosis not present

## 2020-01-03 DIAGNOSIS — C44311 Basal cell carcinoma of skin of nose: Secondary | ICD-10-CM | POA: Diagnosis not present

## 2020-01-03 NOTE — Telephone Encounter (Signed)
Pt called back to give Pneumonia Vaccine  13 in  04/2015 23 in  06/2016

## 2020-01-03 NOTE — Telephone Encounter (Signed)
I spoke with patient & she stated that she would call [facility] to see which pneumonia vaccine she did have plus the date. She will let us know so we can put in patient's chart.

## 2020-01-03 NOTE — Telephone Encounter (Signed)
Documented in chart.

## 2020-01-03 NOTE — Patient Instructions (Signed)

## 2020-01-03 NOTE — Progress Notes (Signed)
   Follow-Up Visit   Subjective  Julie Rivera is a 74 y.o. female who presents for the following: Lesions.  Patient has several spots she would like checked. There is one at nose that has been there for about 1 year off and on, it has bled and does form a scab. There is another at left arm that has been there for at least 1 year that is raised and sometimes hurts. She also has a spot at right ear that has itched in the past but there was not a scab until recently.  Patient does have a history of BCC and SCC.  Has had Mohs on her nose.  The following portions of the chart were reviewed this encounter and updated as appropriate:      Review of Systems:  No other skin or systemic complaints except as noted in HPI or Assessment and Plan.  Objective  Well appearing patient in no apparent distress; mood and affect are within normal limits.  A focused examination was performed including face, ears, arms. Relevant physical exam findings are noted in the Assessment and Plan.  Objective  Left Upper Arm x 1, right mid ear helix x 1 (2): Erythematous keratotic or waxy stuck-on papule, with scab on R ear helix   Objective  Left Alar Crease: 2.68mm light pink depressed macule, slightly scaly       Objective  Nose: Telangiectasias with scarring from previous skin cancer surgery   Assessment & Plan  Inflamed seborrheic keratosis (2) Left Upper Arm x 1, right mid ear helix x 1  Cryotherapy today Recheck right ear at follow up  Destruction of lesion - Left Upper Arm x 1, right mid ear helix x 1  Destruction method: cryotherapy   Informed consent: discussed and consent obtained   Lesion destroyed using liquid nitrogen: Yes   Region frozen until ice ball extended beyond lesion: Yes   Outcome: patient tolerated procedure well with no complications   Post-procedure details: wound care instructions given    Neoplasm of uncertain behavior of skin Left Alar Crease  Skin / nail  biopsy Type of biopsy: tangential   Informed consent: discussed and consent obtained   Patient was prepped and draped in usual sterile fashion: Area prepped with alcohol. Anesthesia: the lesion was anesthetized in a standard fashion   Anesthetic:  1% lidocaine w/ epinephrine 1-100,000 buffered w/ 8.4% NaHCO3 Instrument used: flexible razor blade   Hemostasis achieved with: pressure, aluminum chloride and electrodesiccation   Outcome: patient tolerated procedure well   Post-procedure details: wound care instructions given   Post-procedure details comment:  Ointment and small bandage applied  Specimen 1 - Surgical pathology Differential Diagnosis: AK r/o BCC Check Margins: No 2.84mm light pink depressed macule, slightly scaly  R/o BCC, If + plan EDC  Telangiectasias Nose  Recommend BBL with possible fraxel. Will have patient consult with Sonia Baller.   Return as scheduled, for TBSE.  Graciella Belton, RMA, am acting as scribe for Brendolyn Patty, MD . Documentation: I have reviewed the above documentation for accuracy and completeness, and I agree with the above.  Brendolyn Patty MD

## 2020-01-04 ENCOUNTER — Telehealth: Payer: Self-pay | Admitting: Hematology and Oncology

## 2020-01-04 ENCOUNTER — Other Ambulatory Visit: Payer: Self-pay | Admitting: Family

## 2020-01-04 NOTE — Telephone Encounter (Signed)
Scheduled appts per 9/24 los. Left voicemail with appt date, time, and details of appt being a phone visit.

## 2020-01-10 ENCOUNTER — Telehealth: Payer: Self-pay

## 2020-01-10 NOTE — Telephone Encounter (Signed)
-----   Message from Brendolyn Patty, MD sent at 01/10/2020  9:58 AM EDT ----- Skin , left alar crease BASAL CELL CARCINOMA, NODULAR PATTERN  BCC- We discussed treatment options at time of biopsy, Mohs vrs EDC, and she had decided EDC.  Can review options with pt. and confirm that is still her preference.

## 2020-01-10 NOTE — Telephone Encounter (Signed)
Advised pt of bx results.  Discussed again EDC vs MOHs, pt does not want to do [facility] and she would like to have an appointment with Dr. Nicole Kindred to discuss [facility].  Pt has an appointment with Lind Covert on 02/14/20 at 12:30, so I scheduled pt with Dr. Nicole Kindred at 12:15 on that same day, just for discussion of treatment./sh

## 2020-01-30 NOTE — Progress Notes (Signed)
HEMATOLOGY-ONCOLOGY TELEPHONE VISIT PROGRESS NOTE  I connected with Julie Rivera on 01/31/2020 at  2:45 PM EDT by telephone and verified that I am speaking with the correct person using two identifiers.  I discussed the limitations, risks, security and privacy concerns of performing an evaluation and management service by telephone and the availability of in person appointments.  I also discussed with the patient that there may be a patient responsible charge related to this service. The patient expressed understanding and agreed to proceed.   History of Present Illness: Julie Rivera is a 74 y.o. female with above-mentioned history of left breast cancerwhounderwent a left lumpectomy, radiation, and is currently on antiestrogen therapy with tamoxifen after she could not tolerate anastrozole.She presents over the phone today for follow-up.    Severe profound hot flashes, joint stiffness and achiness with tamoxifen.  Therefore she cannot take any antiestrogen therapies.  She tells me that Ambien works very well for her hot flashes.  She takes 1 tablet at bedtime.  Oncology History  Malignant neoplasm of upper-outer quadrant of left breast in female, estrogen receptor positive ([place])  05/21/2019 Initial Diagnosis   Screening mammogram detected an asymmetry and calcifications 4.5 cm in the left breast. Diagnostic mammogram showed a 1.9cm mass and calcifications in the upper outer left breast.  Biopsy revealed grade 2 IDC ER/PR positive HER-2 negative with a Ki-67 of 5%   07/29/2019 Surgery   Left lumpectomy [facility]es) ([id number]): IDC, grade 1, 2.4cm, with low grade DCIS, 2 left axillary lymph nodes negative. Broadly present at the anterior margin of specimen B and focally present at the inferior margin of specimen B. Focally present at the lateral margin of specimen B.   07/29/2019 Cancer Staging   Staging form: Breast, AJCC 8th Edition - Pathologic stage from 07/29/2019: Stage IA (pT2,  pN0, cM0, G1, ER+, PR+, HER2-)   08/25/2019 - 09/23/2019 Radiation Therapy   The patient initially received a dose of 40.05 Gy in 15 fractions to the breast using whole-breast tangent fields. This was delivered using a 3-D conformal technique. The pt received a boost delivering an additional 12 Gy in 6 fractions using a electron boost with 79mV electrons. The total dose was 52.05 Gy.   10/2019 -  Anti-estrogen oral therapy   Anastrozole, discontinued 12/31/19 due to insomnia, hot flashes, and joint pain; switched to tamoxifen 12/31/19     Observations/Objective:    Assessment Plan:  Malignant neoplasm of upper-outer quadrant of left breast in female, estrogen receptor positive ([place] 05/21/2019:Screening mammogram detected an asymmetry and calcifications in the left breastcalcifications span 4.5 cm the palpable lump inside of that measured 1.7 cm. Diagnostic mammogram showed a 1.9cm mass and calcifications in the upper outer left breast.Biopsy revealed grade 2 invasive ductal carcinoma ER/PR positive HER-2 negative with a Ki-67 of 15%  07/29/2019:Left lumpectomy ([facility]): IDC, grade 1, 2.4cm, with low grade DCIS, 2 left axillary lymph nodes negative.ER/PR positive HER-2 negative with a Ki-67 of 15% positive anterior and inferior margins  Treatment plan: Resection of the positive margins 1.Adjuvant radiation therapy5/20/21-6/11/21 2.Followed by adjuvant antiestrogen therapy: anastrozole 1 mg daily switched to letrozole stopped 12/19/19 changed to Tamoxifen 12/31/19 due to hot flashes and insomnia and knee pain and fatigue, patient could not tolerate even 10 mg of tamoxifen.  Therefore we will discontinue further antiestrogen treatment.   Severe hot flashes: Patient takes Ambien at bedtime.  This is helping her significantly.  She has intolerance to gabapentin.  She developed profound nightmares when  she took it.  Breast cancer surveillance:  Mammogram 10/14/2019: [facility]: Benign Breast  exam: Benign Bone density 10/14/2019: T score -1.8  Return to clinic in 1 year for follow-up.  I discussed the assessment and treatment plan with the patient. The patient was provided an opportunity to ask questions and all were answered. The patient agreed with the plan and demonstrated an understanding of the instructions. The patient was advised to call back or seek an in-person evaluation if the symptoms worsen or if the condition fails to improve as anticipated.   I provided 15 minutes of non-face-to-face time during this encounter.   Rulon Eisenmenger, MD 01/31/2020    I, Molly Dorshimer, am acting as scribe for Nicholas Lose, MD.  I have reviewed the above documentation for accuracy and completeness, and I agree with the above.

## 2020-01-31 ENCOUNTER — Inpatient Hospital Stay: Payer: Medicare Other | Attending: Hematology and Oncology | Admitting: Hematology and Oncology

## 2020-01-31 DIAGNOSIS — R232 Flushing: Secondary | ICD-10-CM | POA: Insufficient documentation

## 2020-01-31 DIAGNOSIS — Z923 Personal history of irradiation: Secondary | ICD-10-CM | POA: Insufficient documentation

## 2020-01-31 DIAGNOSIS — C50412 Malignant neoplasm of upper-outer quadrant of left female breast: Secondary | ICD-10-CM | POA: Insufficient documentation

## 2020-01-31 DIAGNOSIS — G4700 Insomnia, unspecified: Secondary | ICD-10-CM | POA: Insufficient documentation

## 2020-01-31 DIAGNOSIS — Z79810 Long term (current) use of selective estrogen receptor modulators (SERMs): Secondary | ICD-10-CM | POA: Insufficient documentation

## 2020-01-31 DIAGNOSIS — Z170 Estrogen receptor positive status [ER+]: Secondary | ICD-10-CM

## 2020-01-31 DIAGNOSIS — Z79899 Other long term (current) drug therapy: Secondary | ICD-10-CM | POA: Insufficient documentation

## 2020-01-31 NOTE — Assessment & Plan Note (Addendum)
05/21/2019:Screening mammogram detected an asymmetry and calcifications in the left breastcalcifications span 4.5 cm the palpable lump inside of that measured 1.7 cm. Diagnostic mammogram showed a 1.9cm mass and calcifications in the upper outer left breast.Biopsy revealed grade 2 invasive ductal carcinoma ER/PR positive HER-2 negative with a Ki-67 of 15%  07/29/2019:Left lumpectomy ([facility]): IDC, grade 1, 2.4cm, with low grade DCIS, 2 left axillary lymph nodes negative.ER/PR positive HER-2 negative with a Ki-67 of 15% positive anterior and inferior margins  Treatment plan: Resection of the positive margins 1.Adjuvant radiation therapy5/20/21-6/11/21 2.Followed by adjuvant antiestrogen therapy: anastrozole 1 mg daily switched to letrozole stopped 12/19/19 changed to Tamoxifen 12/31/19 due to hot flashes and insomnia and knee pain and fatigue    Tamoxifen toxicities:  Current dosage: 10 mg daily  Breast cancer surveillance:  Mammogram 10/14/2019: [facility]: Benign Breast exam: Benign Bone density 10/14/2019: T score -1.8  Return to clinic in 1 year for follow-up

## 2020-02-04 ENCOUNTER — Other Ambulatory Visit: Payer: Self-pay | Admitting: Family

## 2020-02-14 ENCOUNTER — Ambulatory Visit (INDEPENDENT_AMBULATORY_CARE_PROVIDER_SITE_OTHER): Payer: Medicare Other | Admitting: Dermatology

## 2020-02-14 ENCOUNTER — Encounter: Payer: Self-pay | Admitting: Dermatology

## 2020-02-14 ENCOUNTER — Other Ambulatory Visit: Payer: Self-pay

## 2020-02-14 ENCOUNTER — Ambulatory Visit (INDEPENDENT_AMBULATORY_CARE_PROVIDER_SITE_OTHER): Payer: Self-pay

## 2020-02-14 DIAGNOSIS — I781 Nevus, non-neoplastic: Secondary | ICD-10-CM

## 2020-02-14 DIAGNOSIS — C44311 Basal cell carcinoma of skin of nose: Secondary | ICD-10-CM

## 2020-02-14 DIAGNOSIS — L905 Scar conditions and fibrosis of skin: Secondary | ICD-10-CM

## 2020-02-14 NOTE — Progress Notes (Signed)
   Follow-Up Visit   Subjective  Julie Rivera is a 74 y.o. female who presents for the following: BCC bx proven (L alar crease, discuss treatment options).  She thinks it has cleared with biopsy and she wants to watch the area for now.   The following portions of the chart were reviewed this encounter and updated as appropriate:      Review of Systems:  No other skin or systemic complaints except as noted in HPI or Assessment and Plan.  Objective  Well appearing patient in no apparent distress; mood and affect are within normal limits.  A focused examination was performed including face. Relevant physical exam findings are noted in the Assessment and Plan.  Objective  L alar crease: Indistinct pink bx site   Assessment & Plan  Basal cell carcinoma (BCC) of skin of nose L alar crease  Bx proven  Pt prefers not to treat at this time since it seems to have cleared with the biopsy alone.  Will observe for recurrence, and then treat.  Can test to see if residual BCC present- start 5FU bid aa until gets a reaction and then we will treat with EDC on f/u or continue 5FU bid for 4-6wks.   Return for as scheduled for TBSE.   I, Othelia Pulling, RMA, am acting as scribe for Brendolyn Patty, MD . Documentation: I have reviewed the above documentation for accuracy and completeness, and I agree with the above.  Brendolyn Patty MD

## 2020-02-14 NOTE — Progress Notes (Signed)
Pt presents today for post MOHS scar treatment. Her biggest concern is the redness. I recommended BBL, 4 treatments spaced 4-6 weeks apart. Risks and benefits reviewed. Quoted $200 per tx. Jj. Info given

## 2020-04-03 ENCOUNTER — Telehealth: Payer: Self-pay | Admitting: Family

## 2020-04-03 ENCOUNTER — Other Ambulatory Visit: Payer: Self-pay | Admitting: Family

## 2020-04-03 NOTE — Telephone Encounter (Signed)
Call pt   I have refilled your ambien  However I wanted to remind you that this is controlled substance.   In order for me to prescribe medication,  patients must be seen every 3 months.   Please make follow-up appointment in the next couple of months  I looked up patient on [place] Controlled Substances Reporting System and saw no activity that raised concern of inappropriate use.

## 2020-04-04 NOTE — Telephone Encounter (Signed)
Patient scheduled for f/u 05/15/20.

## 2020-04-11 DIAGNOSIS — H04129 Dry eye syndrome of unspecified lacrimal gland: Secondary | ICD-10-CM | POA: Diagnosis not present

## 2020-04-11 DIAGNOSIS — H2513 Age-related nuclear cataract, bilateral: Secondary | ICD-10-CM | POA: Diagnosis not present

## 2020-04-17 ENCOUNTER — Ambulatory Visit (INDEPENDENT_AMBULATORY_CARE_PROVIDER_SITE_OTHER): Payer: Medicare Other | Admitting: Dermatology

## 2020-04-17 ENCOUNTER — Other Ambulatory Visit: Payer: Self-pay

## 2020-04-17 DIAGNOSIS — Z85828 Personal history of other malignant neoplasm of skin: Secondary | ICD-10-CM | POA: Diagnosis not present

## 2020-04-17 DIAGNOSIS — L905 Scar conditions and fibrosis of skin: Secondary | ICD-10-CM | POA: Diagnosis not present

## 2020-04-17 DIAGNOSIS — L820 Inflamed seborrheic keratosis: Secondary | ICD-10-CM

## 2020-04-17 NOTE — Progress Notes (Signed)
   Follow-Up Visit   Subjective  Julie Rivera is a 75 y.o. female who presents for the following: recheck BCC (L alar crease, 5FU only for a few days bc pt said was getting on other parts of face) and check spot (R upper arm). Stays red and crusty.   The following portions of the chart were reviewed this encounter and updated as appropriate:       Review of Systems:  No other skin or systemic complaints except as noted in HPI or Assessment and Plan.  Objective  Well appearing patient in no apparent distress; mood and affect are within normal limits.  A focused examination was performed including face. Relevant physical exam findings are noted in the Assessment and Plan.  Objective  L alar crease: Clear today  Objective  nose: Scar with erythema  Objective  R upper arm x 2, R forearm x 1, R elbow x 1, L lower shoulder x 1 (5): Pink brown speckled macules with mild scale R upper arm x 2 Waxy keratotic pap R forearm x 1, R elbow x 1, L lower shoulder x 1   Assessment & Plan  History of basal cell carcinoma (BCC) L alar crease  Clear with bx only Observe for recurrence.  If recurs, will need Mohs  Pt only used 5FU for a few days and d/c due to it getting on other parts of face and not keeping it on just the bx site.  Scar nose  2ndary to [facility] for [facility] with BBL treatment, discussed further improvement with additional BBL treatments  Inflamed seborrheic keratosis (5) R upper arm x 2, R forearm x 1, R elbow x 1, L lower shoulder x 1  Vs pigmented BCC R upper arm x 2  ISK R forearm, R elbow, L lower shoulder x 3  If R upper arm macules do not resolve with LN2 will plan bx  Destruction of lesion - R upper arm x 2, R forearm x 1, R elbow x 1, L lower shoulder x 1  Destruction method: cryotherapy   Informed consent: discussed and consent obtained   Lesion destroyed using liquid nitrogen: Yes   Region frozen until ice ball extended beyond lesion: Yes    Outcome: patient tolerated procedure well with no complications   Post-procedure details: wound care instructions given    Return in about 6 months (around 10/15/2020) for TBSE, recheck hx of BCC L alar crease, ISKS R upper arm x 2.  I, Sonya Hupman, RMA, am acting as scribe for Brendolyn Patty, MD . Documentation: I have reviewed the above documentation for accuracy and completeness, and I agree with the above.  Brendolyn Patty MD

## 2020-05-02 DIAGNOSIS — H02889 Meibomian gland dysfunction of unspecified eye, unspecified eyelid: Secondary | ICD-10-CM | POA: Diagnosis not present

## 2020-05-02 DIAGNOSIS — H1045 Other chronic allergic conjunctivitis: Secondary | ICD-10-CM | POA: Diagnosis not present

## 2020-05-02 DIAGNOSIS — H2513 Age-related nuclear cataract, bilateral: Secondary | ICD-10-CM | POA: Diagnosis not present

## 2020-05-02 DIAGNOSIS — H04129 Dry eye syndrome of unspecified lacrimal gland: Secondary | ICD-10-CM | POA: Diagnosis not present

## 2020-05-15 ENCOUNTER — Other Ambulatory Visit: Payer: Self-pay

## 2020-05-15 ENCOUNTER — Ambulatory Visit (INDEPENDENT_AMBULATORY_CARE_PROVIDER_SITE_OTHER): Payer: Medicare Other | Admitting: Family

## 2020-05-15 ENCOUNTER — Encounter: Payer: Self-pay | Admitting: Family

## 2020-05-15 DIAGNOSIS — M25561 Pain in right knee: Secondary | ICD-10-CM | POA: Diagnosis not present

## 2020-05-15 DIAGNOSIS — H2511 Age-related nuclear cataract, right eye: Secondary | ICD-10-CM | POA: Diagnosis not present

## 2020-05-15 DIAGNOSIS — G4700 Insomnia, unspecified: Secondary | ICD-10-CM

## 2020-05-15 DIAGNOSIS — H35363 Drusen (degenerative) of macula, bilateral: Secondary | ICD-10-CM | POA: Diagnosis not present

## 2020-05-15 DIAGNOSIS — H25013 Cortical age-related cataract, bilateral: Secondary | ICD-10-CM | POA: Diagnosis not present

## 2020-05-15 DIAGNOSIS — H2513 Age-related nuclear cataract, bilateral: Secondary | ICD-10-CM | POA: Diagnosis not present

## 2020-05-15 MED ORDER — ZOLPIDEM TARTRATE 10 MG PO TABS: 10.0000 mg | ORAL_TABLET | Freq: Every evening | ORAL | 2 refills | Status: DC | PRN

## 2020-05-15 MED ORDER — MELOXICAM 7.5 MG PO TABS: 7.5000 mg | ORAL_TABLET | Freq: Every day | ORAL | 1 refills | Status: DC | PRN

## 2020-05-15 NOTE — Progress Notes (Signed)
Subjective:    Patient ID: Julie Rivera, female    DOB: 1945/09/09, 75 y.o.   MRN: [id number]  CC: YASUKO LAPAGE is a 75 y.o. female who presents today for follow up.   HPI: Feels well today  No complaints  Insomnia-controlled with ambien 10mg . No falls, unusual dreams. She would like to try weaning off medication.  She would like mobic to take prn for left knee pain  Declines labs today.      Follows with Dr Lindi Adie for left breast cancer. She is not on antiestrogen treatment Mammogram, DEXA UTD  HISTORY:  Past Medical History:  Diagnosis Date  . Actinic keratosis   . Allergy   . Basal cell carcinoma 03/22/2019   L nasal tip  . Basal cell carcinoma 03/26/2010   L lat upper chest, L chest infer clavicle  . Basal cell carcinoma 09/12/2009   L medial pretibial  . Basal cell carcinoma 01/03/2020   L alar crease  . Chicken pox   . Kidney stones   . Squamous cell carcinoma of skin 04/22/2016   R ant thigh medial, R ant thigh lateral   Past Surgical History:  Procedure Laterality Date  . BACK SURGERY  2017  . BREAST LUMPECTOMY WITH RADIOACTIVE SEED AND SENTINEL LYMPH NODE BIOPSY Left 07/29/2019   Procedure: LEFT BREAST LUMPECTOMY WITH RADIOACTIVE SEED AND SENTINEL LYMPH NODE BIOPSY;  Surgeon: Stark Klein, MD;  Location: [facility];  Service: General;  Laterality: Left;  PEC BLOCK  . KNEE ARTHROCENTESIS    . RADIOACTIVE SEED GUIDED EXCISIONAL BREAST BIOPSY Right 07/29/2019   Procedure: RIGHT RADIOACTIVE SEED GUIDED EXCISIONAL BREAST BIOPSY;  Surgeon: Stark Klein, MD;  Location: [facility];  Service: General;  Laterality: Right;  PEC BLOCK  . SHOULDER SURGERY    . TONSILLECTOMY     Family History  Problem Relation Age of Onset  . Hypertension Sister   . Sudden death Neg Hx   . Hyperlipidemia Neg Hx   . Heart attack Neg Hx   . Diabetes Neg Hx   . Ovarian cancer Neg Hx   . Breast cancer Neg Hx     Allergies: Gabapentin No current outpatient medications on file  prior to visit.   No current facility-administered medications on file prior to visit.    Social History   Tobacco Use  . Smoking status: Never Smoker  . Smokeless tobacco: Never Used  Vaping Use  . Vaping Use: Never used  Substance Use Topics  . Alcohol use: No  . Drug use: No    Review of Systems  Constitutional: Negative for chills and fever.  Respiratory: Negative for cough.   Cardiovascular: Negative for chest pain and palpitations.  Gastrointestinal: Negative for nausea and vomiting.  Musculoskeletal: Positive for arthralgias.  Psychiatric/Behavioral: Negative for sleep disturbance.      Objective:    BP 122/82 (BP Location: Left Arm, Patient Position: Sitting, Cuff Size: Normal)   Pulse 72   Temp 98.3 F (36.8 C) (Oral)   Ht 5\' 9"  (1.753 m)   Wt 138 lb 12.8 oz (63 kg)   LMP 02/07/2016   SpO2 95%   BMI 20.50 kg/m  BP Readings from Last 3 Encounters:  05/15/20 122/82  11/12/19 130/72  11/01/19 121/73   Wt Readings from Last 3 Encounters:  05/15/20 138 lb 12.8 oz (63 kg)  12/28/19 140 lb (63.5 kg)  11/12/19 140 lb (63.5 kg)    Physical Exam Vitals reviewed.  Constitutional:  Appearance: She is well-developed and well-nourished.  Eyes:     Conjunctiva/sclera: Conjunctivae normal.  Cardiovascular:     Rate and Rhythm: Normal rate and regular rhythm.     Pulses: Normal pulses.     Heart sounds: Normal heart sounds.  Pulmonary:     Effort: Pulmonary effort is normal.     Breath sounds: Normal breath sounds. No wheezing, rhonchi or rales.  Skin:    General: Skin is warm and dry.  Neurological:     Mental Status: She is alert.  Psychiatric:        Mood and Affect: Mood and affect normal.        Speech: Speech normal.        Behavior: Behavior normal.        Thought Content: Thought content normal.        Assessment & Plan:   Problem List Items Addressed This Visit      Other   Acute pain of right knee    Stable. Continue prn use of   Mobic.       Insomnia    Stable. Continue ambien 10mg  I looked up patient on [place] Controlled Substances Reporting System PMP AWARE and saw no activity that raised concern of inappropriate use.            I have changed Darnelle Going. Kahrs "Vee"'s zolpidem. I am also having her start on meloxicam.   Meds ordered this encounter  Medications  . zolpidem (AMBIEN) 10 MG tablet    Sig: Take 1 tablet (10 mg total) by mouth at bedtime as needed.    Dispense:  30 tablet    Refill:  2    FOR FUTURE REFILLS PLEASE    Order Specific Question:   Supervising Provider    Answer:   Deborra Medina L [[id number]]  . meloxicam (MOBIC) 7.5 MG tablet    Sig: Take 1 tablet (7.5 mg total) by mouth daily as needed for pain.    Dispense:  60 tablet    Refill:  1    Order Specific Question:   Supervising Provider    Answer:   Crecencio Mc [[id number]]    Return precautions given.   Risks, benefits, and alternatives of the medications and treatment plan prescribed today were discussed, and patient expressed understanding.   Education regarding symptom management and diagnosis given to patient on AVS.  Continue to follow with Burnard Hawthorne, FNP for routine health maintenance.   Julie Rivera and I agreed with plan.   Mable Paris, FNP

## 2020-05-15 NOTE — Assessment & Plan Note (Signed)
Stable. Continue ambien 10mg  I looked up patient on [place] Controlled Substances Reporting System PMP AWARE and saw no activity that raised concern of inappropriate use.

## 2020-05-15 NOTE — Assessment & Plan Note (Signed)
Stable. Continue prn use of  Mobic.

## 2020-05-23 DIAGNOSIS — H25811 Combined forms of age-related cataract, right eye: Secondary | ICD-10-CM | POA: Diagnosis not present

## 2020-05-23 DIAGNOSIS — H2511 Age-related nuclear cataract, right eye: Secondary | ICD-10-CM | POA: Diagnosis not present

## 2020-06-13 ENCOUNTER — Telehealth: Payer: Self-pay | Admitting: Hematology and Oncology

## 2020-06-13 NOTE — Telephone Encounter (Signed)
Scheduled appt calendar mailed with appt letter

## 2020-07-03 DIAGNOSIS — N812 Incomplete uterovaginal prolapse: Secondary | ICD-10-CM | POA: Diagnosis not present

## 2020-07-03 DIAGNOSIS — Z4689 Encounter for fitting and adjustment of other specified devices: Secondary | ICD-10-CM | POA: Diagnosis not present

## 2020-07-03 DIAGNOSIS — N8111 Cystocele, midline: Secondary | ICD-10-CM | POA: Diagnosis not present

## 2020-07-06 ENCOUNTER — Telehealth: Payer: Self-pay

## 2020-07-06 ENCOUNTER — Other Ambulatory Visit: Payer: Self-pay

## 2020-07-06 NOTE — Telephone Encounter (Signed)
Pt would like a refill on her EpiPen. She said hers is expired. She would like two. One to keep in car and one to keep in house

## 2020-07-06 NOTE — Telephone Encounter (Signed)
Okay to send for patient? We do not have listed on chart & I can make patient aware they are quite pricey even for generic.

## 2020-07-07 ENCOUNTER — Other Ambulatory Visit: Payer: Self-pay

## 2020-07-07 MED ORDER — EPINEPHRINE 0.3 MG/0.3ML IJ SOAJ: 0.3000 mg | INTRAMUSCULAR | 1 refills | Status: DC | PRN

## 2020-07-07 NOTE — Telephone Encounter (Signed)
Patient was returning call about epi pen

## 2020-07-07 NOTE — Telephone Encounter (Signed)
LMTCB. Did let patient know that Epi pen was sent. Need to know what he anaphylactic reaction is to.

## 2020-07-07 NOTE — Telephone Encounter (Signed)
Pt called and advised that Epipen was sent. I let her know they are very pricey. I told her I was unsure how much, but to let us know if [facility] advised on a possible cheaper option to send.

## 2020-07-07 NOTE — Telephone Encounter (Signed)
Call pt  I sent in epi pen I dont have allergy listed on chart Does she have h/o anaphylaxis ?

## 2020-07-12 ENCOUNTER — Other Ambulatory Visit: Payer: Self-pay | Admitting: Family

## 2020-08-15 IMAGING — CT CT CHEST W/O CM
2 of 4 series · 15 of 36 positions shown, 18 images · non-contrast
Comparison: 11/10/2017

CLINICAL DATA: Followup evaluation of bilateral pulmonary nodules.

EXAM:
CT CHEST WITHOUT CONTRAST
TECHNIQUE: Multidetector CT imaging of the chest was performed following the
standard protocol without IV contrast.

[Series 2: chest · axial · 0.57mm/px · z∈[-1316,-998]mm · 12 of 189 slices shown, 15 images (1 of 2)]
[im 15/189  mediastinal]
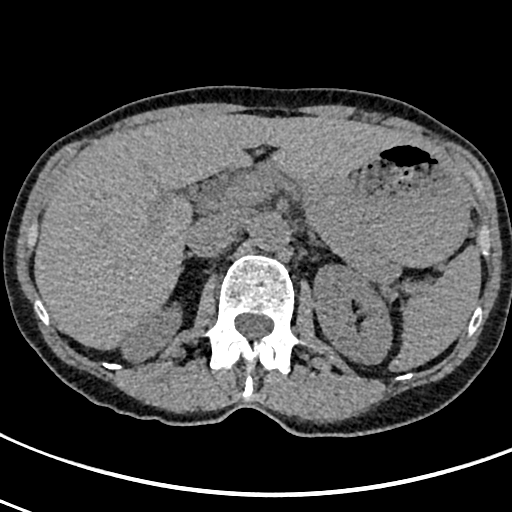
[im 15/189  lung]
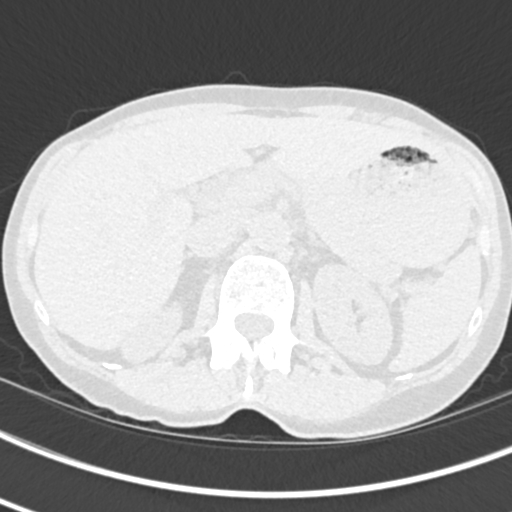
[im 29/189  lung]
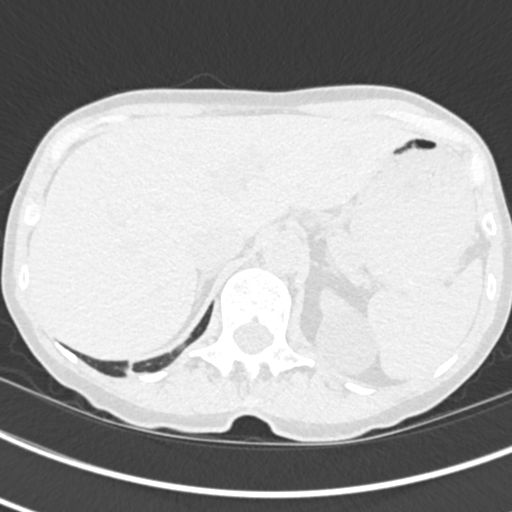
[im 44/189  lung]
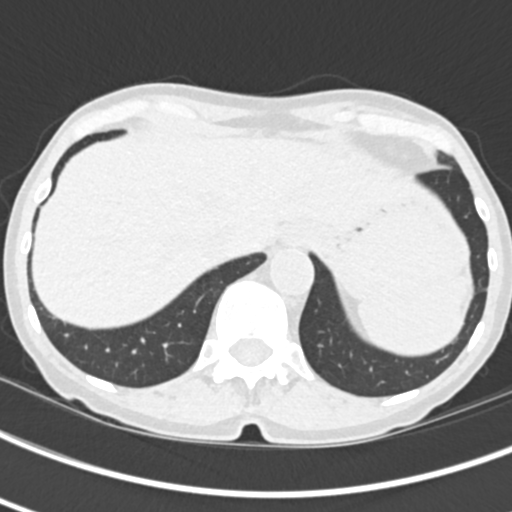
[im 58/189  lung]
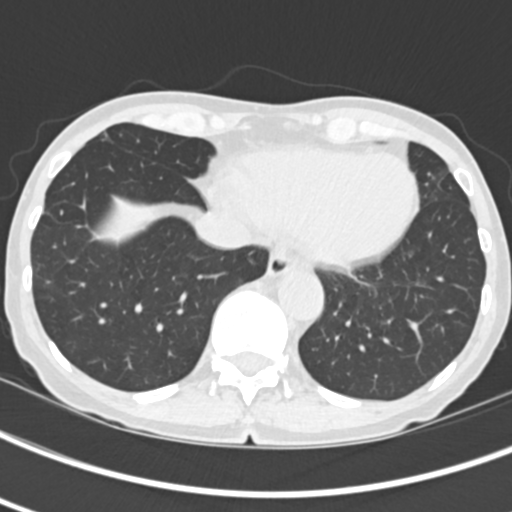
[im 73/189  mediastinal]
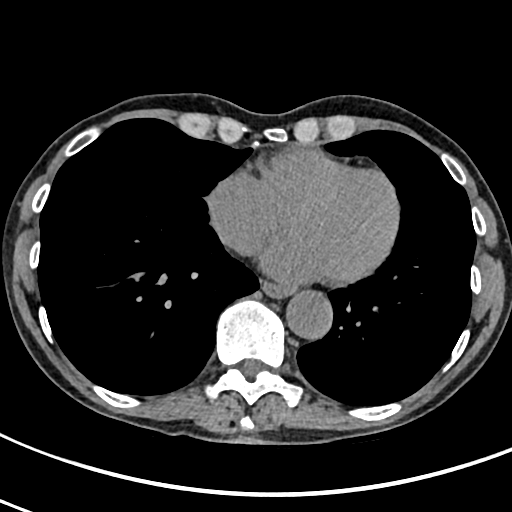
[im 73/189  lung]
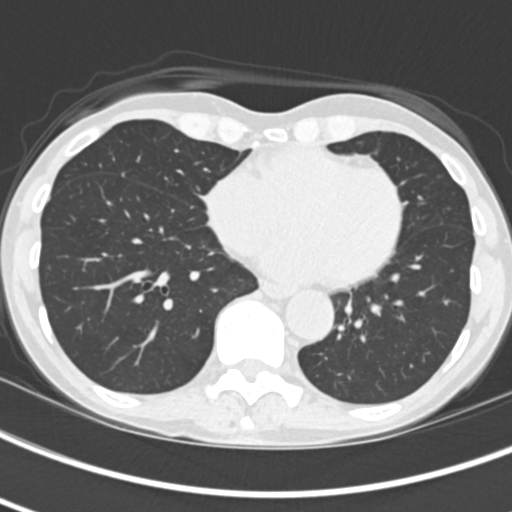
[im 87/189  lung]
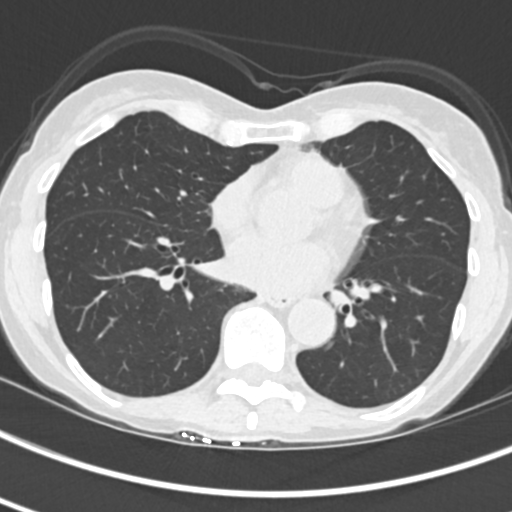
[im 102/189  lung]
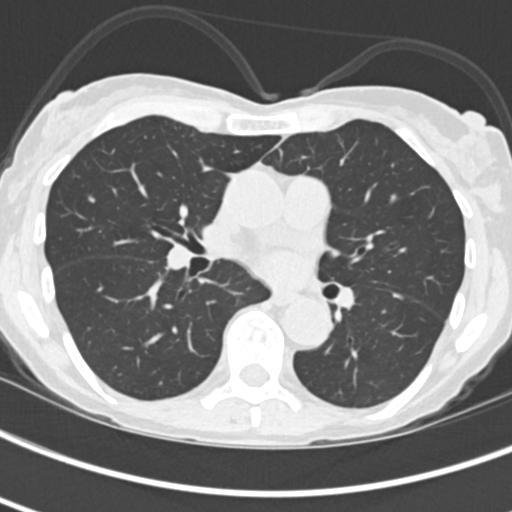
[im 116/189  lung]
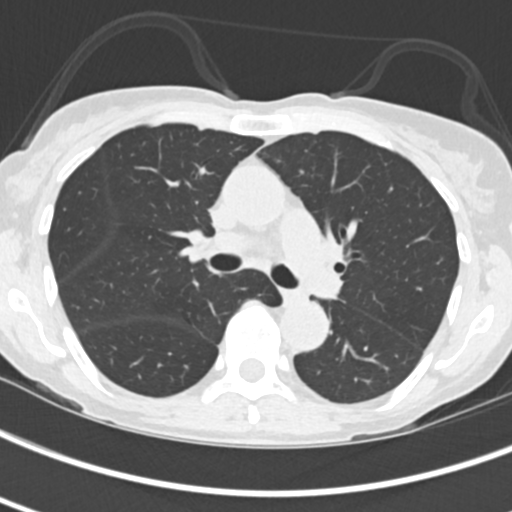
[im 131/189  mediastinal]
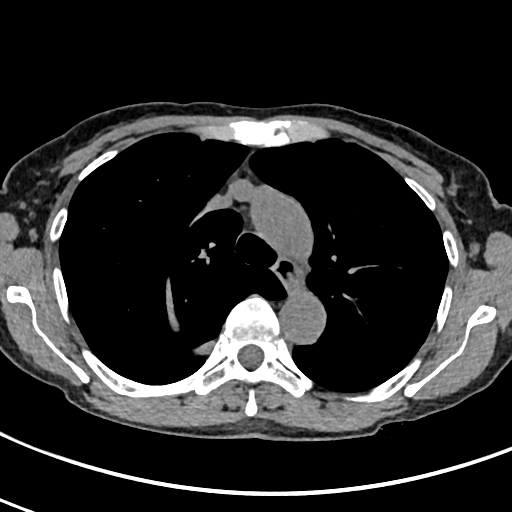
[im 131/189  lung]
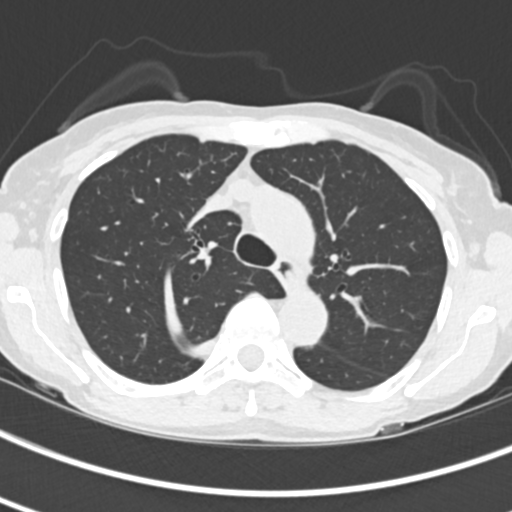
[im 145/189  lung]
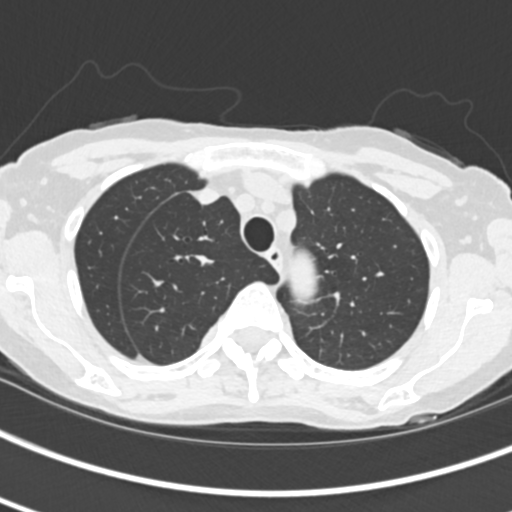
[im 160/189  lung]
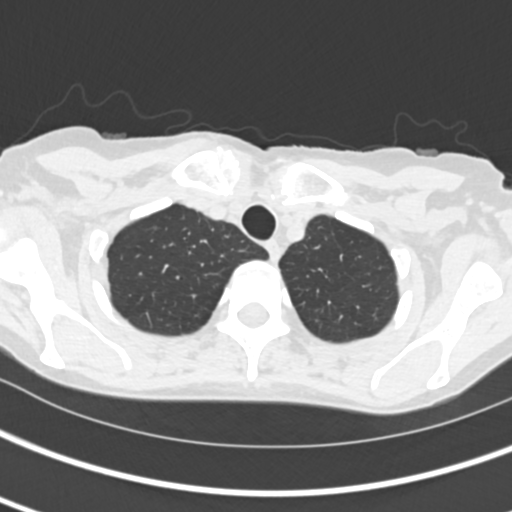
[im 174/189  lung]
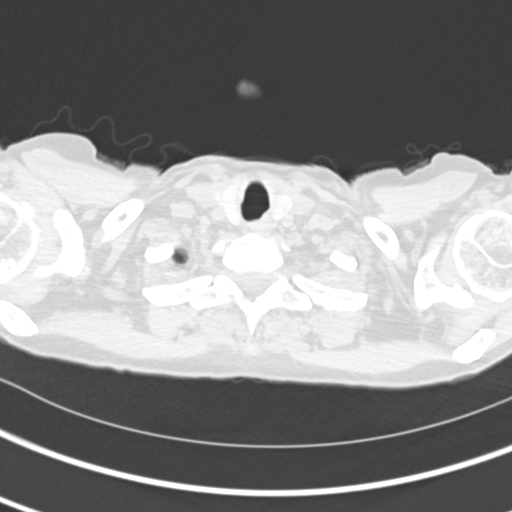

[Series 5: chest · coronal · 0.57mm/px · 3 of 109 slices shown (2 of 2)]
[im 22/109  lung]
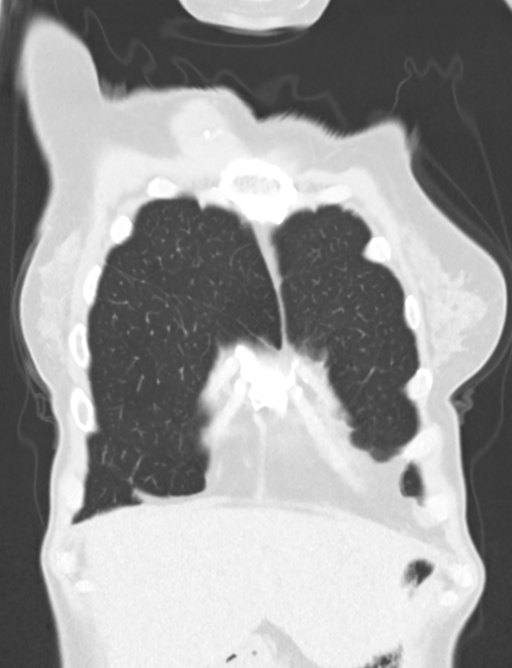
[im 44/109  lung]
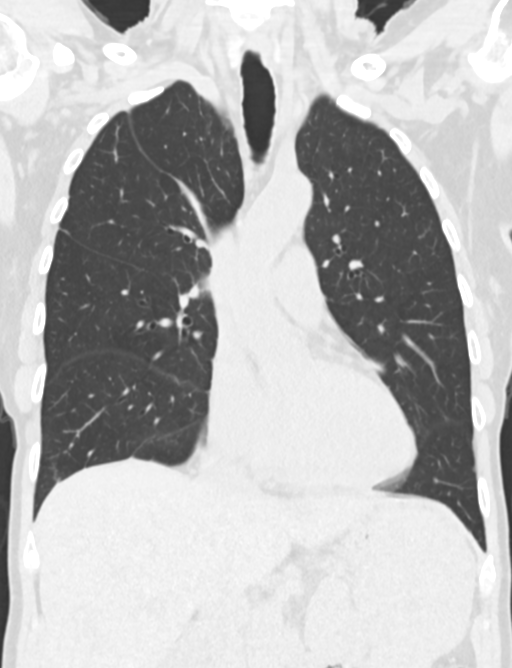
[im 65/109  lung]
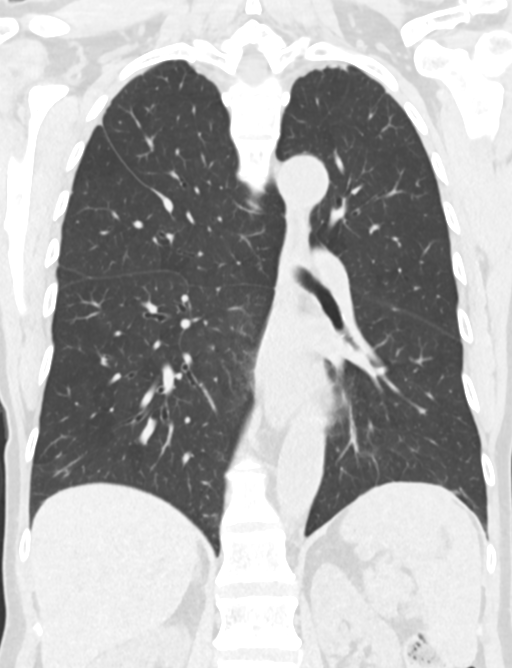

[15 of 36 positions shown; findings below may reference images not displayed]

FINDINGS: Cardiovascular: No significant vascular findings. Normal heart size.
No pericardial effusion.

Mediastinum/Nodes: No axillary supraclavicular adenopathy. No
mediastinal hilar adenopathy. No pericardial effusion.

Lungs/Pleura:

RIGHT upper lobe nodule measuring 5 mm (image 66/3) is unchanged.

3 mm RIGHT lower lobe nodule unchanged.

7 mm subpleural nodule in the LEFT lower lobe (image 91/3) is
unchanged.

Small subpleural nodule laterally in the LEFT lower lobe is also
unchanged.

No new nodularity

Upper Abdomen: Limited view of the liver, kidneys, pancreas are
unremarkable. Normal adrenal glands.

Musculoskeletal: No aggressive osseous lesion.
IMPRESSION: Stable bilateral pulmonary nodules over six-month interval.
Recommend follow-up CT in 12 months from current month in high risk
patients per Fleischner criteria.

## 2020-08-27 ENCOUNTER — Other Ambulatory Visit: Payer: Self-pay | Admitting: Family

## 2020-08-28 DIAGNOSIS — H02889 Meibomian gland dysfunction of unspecified eye, unspecified eyelid: Secondary | ICD-10-CM | POA: Diagnosis not present

## 2020-08-28 DIAGNOSIS — H04129 Dry eye syndrome of unspecified lacrimal gland: Secondary | ICD-10-CM | POA: Diagnosis not present

## 2020-08-28 DIAGNOSIS — H2512 Age-related nuclear cataract, left eye: Secondary | ICD-10-CM | POA: Diagnosis not present

## 2020-08-28 DIAGNOSIS — H1045 Other chronic allergic conjunctivitis: Secondary | ICD-10-CM | POA: Diagnosis not present

## 2020-08-28 NOTE — Telephone Encounter (Signed)
RX Refill:ambien Last Seen:05-15-20 Last ordered:05-15-20

## 2020-09-14 ENCOUNTER — Telehealth: Payer: Self-pay | Admitting: Family

## 2020-09-14 NOTE — Telephone Encounter (Signed)
PT called to request a refill of their zolpidem (AMBIEN) 10 MG tablet to be called in to [facility]. [facility] stated to her that if we called they be able to give to her early. She is going out of town tomorrow and would like to get it today or to where she will be going to.

## 2020-09-14 NOTE — Telephone Encounter (Signed)
PT called back to see if they could just pay for it as well instead of using insurance but was not sure what gives the issue about filling meds wether it be insurance or the doctor.

## 2020-09-15 NOTE — Telephone Encounter (Signed)
Patient has a refill on her medication but she is going out of town for 2 weeks and will run out while she is gone. Due for refill around 6/20. Are you ok with me calling to authorize early fill. She is wanting to pick up before she leaves.

## 2020-09-15 NOTE — Telephone Encounter (Signed)
Access Nurse Documentation   

## 2020-09-15 NOTE — Telephone Encounter (Signed)
It is fine to fill early.

## 2020-09-15 NOTE — Telephone Encounter (Signed)
Early fill authorization given and patient is aware.

## 2020-10-03 ENCOUNTER — Encounter (HOSPITAL_COMMUNITY): Payer: Self-pay

## 2020-10-04 DIAGNOSIS — H1045 Other chronic allergic conjunctivitis: Secondary | ICD-10-CM | POA: Diagnosis not present

## 2020-10-04 DIAGNOSIS — H02889 Meibomian gland dysfunction of unspecified eye, unspecified eyelid: Secondary | ICD-10-CM | POA: Diagnosis not present

## 2020-10-04 DIAGNOSIS — H2512 Age-related nuclear cataract, left eye: Secondary | ICD-10-CM | POA: Diagnosis not present

## 2020-10-04 DIAGNOSIS — H04129 Dry eye syndrome of unspecified lacrimal gland: Secondary | ICD-10-CM | POA: Diagnosis not present

## 2020-10-10 DIAGNOSIS — Z20822 Contact with and (suspected) exposure to covid-19: Secondary | ICD-10-CM | POA: Diagnosis not present

## 2020-10-16 ENCOUNTER — Ambulatory Visit (INDEPENDENT_AMBULATORY_CARE_PROVIDER_SITE_OTHER): Payer: Medicare Other | Admitting: Dermatology

## 2020-10-16 ENCOUNTER — Ambulatory Visit: Payer: Medicare Other | Admitting: Dermatology

## 2020-10-16 ENCOUNTER — Other Ambulatory Visit: Payer: Self-pay

## 2020-10-16 DIAGNOSIS — L821 Other seborrheic keratosis: Secondary | ICD-10-CM | POA: Diagnosis not present

## 2020-10-16 DIAGNOSIS — L738 Other specified follicular disorders: Secondary | ICD-10-CM

## 2020-10-16 DIAGNOSIS — Z853 Personal history of malignant neoplasm of breast: Secondary | ICD-10-CM | POA: Diagnosis not present

## 2020-10-16 DIAGNOSIS — H1045 Other chronic allergic conjunctivitis: Secondary | ICD-10-CM | POA: Diagnosis not present

## 2020-10-16 DIAGNOSIS — R922 Inconclusive mammogram: Secondary | ICD-10-CM | POA: Diagnosis not present

## 2020-10-16 DIAGNOSIS — L570 Actinic keratosis: Secondary | ICD-10-CM | POA: Diagnosis not present

## 2020-10-16 DIAGNOSIS — H2512 Age-related nuclear cataract, left eye: Secondary | ICD-10-CM | POA: Diagnosis not present

## 2020-10-16 DIAGNOSIS — Z1283 Encounter for screening for malignant neoplasm of skin: Secondary | ICD-10-CM

## 2020-10-16 DIAGNOSIS — D229 Melanocytic nevi, unspecified: Secondary | ICD-10-CM

## 2020-10-16 DIAGNOSIS — L578 Other skin changes due to chronic exposure to nonionizing radiation: Secondary | ICD-10-CM

## 2020-10-16 DIAGNOSIS — H04129 Dry eye syndrome of unspecified lacrimal gland: Secondary | ICD-10-CM | POA: Diagnosis not present

## 2020-10-16 DIAGNOSIS — D692 Other nonthrombocytopenic purpura: Secondary | ICD-10-CM

## 2020-10-16 DIAGNOSIS — H00012 Hordeolum externum right lower eyelid: Secondary | ICD-10-CM | POA: Diagnosis not present

## 2020-10-16 DIAGNOSIS — L814 Other melanin hyperpigmentation: Secondary | ICD-10-CM | POA: Diagnosis not present

## 2020-10-16 DIAGNOSIS — D1800 Hemangioma unspecified site: Secondary | ICD-10-CM

## 2020-10-16 DIAGNOSIS — Z85828 Personal history of other malignant neoplasm of skin: Secondary | ICD-10-CM | POA: Diagnosis not present

## 2020-10-16 DIAGNOSIS — H02889 Meibomian gland dysfunction of unspecified eye, unspecified eyelid: Secondary | ICD-10-CM | POA: Diagnosis not present

## 2020-10-16 NOTE — Progress Notes (Signed)
Follow-Up Visit   Subjective  Julie Rivera is a 75 y.o. female who presents for the following: Annual Exam (Patient reports a dark place in patients left side of face in hairline. Patient reports no new concerns. Patient reports some laser treatment on nose since mohs surgery. ).  Sonia Baller did treatment on it down in [facility] and now it is less red.  The following portions of the chart were reviewed this encounter and updated as appropriate:       Review of Systems: No other skin or systemic complaints.  Objective  Well appearing patient in no apparent distress; mood and affect are within normal limits.  A full examination was performed including scalp, head, eyes, ears, nose, lips, neck, chest, axillae, abdomen, back, buttocks, bilateral upper extremities, bilateral lower extremities, hands, feet, fingers, toes, fingernails, and toenails. All findings within normal limits unless otherwise noted below.  L temple/hairline Stuck-on, waxy, tan-brown papule --Discussed benign etiology and prognosis.   Post neck x 1, L upper arm x 1 (2) Erythematous thin papules/macules with gritty scale.   Assessment & Plan  Seborrheic keratosis L temple/hairline  Reassured benign age-related growth.  Recommend observation.  Discussed cryotherapy if spot(s) become irritated or inflamed.  AK (actinic keratosis) (2) Post neck x 1, L upper arm x 1  Vs ISK   Destruction of lesion - Post neck x 1, L upper arm x 1  Destruction method: cryotherapy   Informed consent: discussed and consent obtained   Lesion destroyed using liquid nitrogen: Yes   Region frozen until ice ball extended beyond lesion: Yes   Outcome: patient tolerated procedure well with no complications   Post-procedure details: wound care instructions given   Additional details:  Prior to procedure, discussed risks of blister formation, small wound, skin dyspigmentation, or rare scar following cryotherapy. Recommend Vaseline ointment  to treated areas while healing.   Lentigines - Scattered tan macules - Due to sun exposure - Benign-appering, observe - Recommend daily broad spectrum sunscreen SPF 30+ to sun-exposed areas, reapply every 2 hours as needed. - Call for any changes  Seborrheic Keratoses - Stuck-on, waxy, tan-brown papules and/or plaques  - Benign-appearing - Discussed benign etiology and prognosis. - Observe - Call for any changes  Melanocytic Nevi - Tan-brown and/or pink-flesh-colored symmetric macules and papules - Benign appearing on exam today - Observation - Call clinic for new or changing moles - Recommend daily use of broad spectrum spf 30+ sunscreen to sun-exposed areas.   Hemangiomas - Red papules - Discussed benign nature - Observe - Call for any changes  Actinic Damage - Chronic condition, secondary to cumulative UV/sun exposure - diffuse scaly erythematous macules with underlying dyspigmentation - Recommend daily broad spectrum sunscreen SPF 30+ to sun-exposed areas, reapply every 2 hours as needed.  - Staying in the shade or wearing long sleeves, sun glasses (UVA+UVB protection) and wide brim hats (4-inch brim around the entire circumference of the hat) are also recommended for sun protection.  - Call for new or changing lesions.  History of Basal Cell Carcinoma of the Skin - No evidence of recurrence today nose, L alar crease - Recommend regular full body skin exams - Recommend daily broad spectrum sunscreen SPF 30+ to sun-exposed areas, reapply every 2 hours as needed.  - Call if any new or changing lesions are noted between office visits  History of Squamous Cell Carcinoma of the Skin - No evidence of recurrence today R thigh - Recommend regular full body  skin exams - Recommend daily broad spectrum sunscreen SPF 30+ to sun-exposed areas, reapply every 2 hours as needed.  - Call if any new or changing lesions are noted between office visits  Sebaceous Hyperplasia - Small  yellow papules with a central dell - Benign - Observe  Purpura - Chronic; persistent and recurrent.  Treatable, but not curable. - Violaceous macules and patches - Benign - Related to trauma, age, sun damage and/or use of blood thinners, chronic use of topical and/or oral steroids - Observe - Can use OTC arnica containing moisturizer such as Dermend Bruise Formula if desired - Call for worsening or other concerns  Skin cancer screening performed today.  Return in about 6 months (around 04/18/2021) for sun exposed areas.  Luther Redo, CMA, am acting as scribe for Brendolyn Patty, MD .  Documentation: I have reviewed the above documentation for accuracy and completeness, and I agree with the above.  Brendolyn Patty MD

## 2020-10-16 NOTE — Patient Instructions (Signed)

## 2020-10-17 ENCOUNTER — Telehealth: Payer: Self-pay | Admitting: Family

## 2020-10-17 NOTE — Telephone Encounter (Signed)
I spoke with patient & she stated that [facility] said that she will have one more diagnostic then she can have screenings after that going forward.

## 2020-10-17 NOTE — Telephone Encounter (Signed)
Call pt Received mammogram and left breast ultrasound from [facility] and she can have screening mammogram in one year, sooner if concerns

## 2020-10-20 NOTE — Telephone Encounter (Signed)
Noted  

## 2020-11-03 ENCOUNTER — Telehealth: Payer: Self-pay | Admitting: Family

## 2020-11-03 NOTE — Telephone Encounter (Signed)
Patient will be out of town and wants to get her perscription for  zolpidem (AMBIEN) 10 MG tablet sent to [facility] in [place].

## 2020-11-03 NOTE — Telephone Encounter (Signed)
RX Refill:Ambien Last Seen:05-15-20 Last ordered:08-28-20

## 2020-11-06 ENCOUNTER — Other Ambulatory Visit: Payer: Self-pay

## 2020-11-06 MED ORDER — ZOLPIDEM TARTRATE 10 MG PO TABS: 10.0000 mg | ORAL_TABLET | Freq: Every evening | ORAL | 0 refills | Status: DC | PRN

## 2020-11-06 NOTE — Telephone Encounter (Signed)
I spoke with patient to let her know that this was refilled.

## 2020-11-06 NOTE — Telephone Encounter (Signed)
Printed rx for [facility] but cant find [facility]  Please call pharmacy and fax  Please call pt and let her know  I looked up patient on [place] Controlled Substances Reporting System PMP AWARE and saw no activity that raised concern of inappropriate use.

## 2020-11-23 ENCOUNTER — Telehealth: Payer: Self-pay | Admitting: Family

## 2020-11-23 MED ORDER — EPINEPHRINE 0.3 MG/0.3ML IJ SOAJ: 0.3000 mg | INTRAMUSCULAR | 1 refills | Status: AC | PRN

## 2020-11-23 NOTE — Telephone Encounter (Signed)
Patient called and said she just used her EPINEPHrine 0.3 mg/0.3 mL IJ SOAJ injection. She needs a refill. She was advised to call 911 since she just used her EPINEPHrine 0.3 mg/0.3 mL IJ SOAJ injection. Patient said she would, at time of call patient had not symptoms, she knew she needed to take her EPINEPHrine 0.3 mg/0.3 mL IJ SOAJ injection

## 2020-12-02 ENCOUNTER — Other Ambulatory Visit: Payer: Self-pay | Admitting: Family

## 2020-12-05 NOTE — Telephone Encounter (Signed)
Last OV 05/15/20  Next OV none Last refill 11/06/20 #30 no refills

## 2020-12-11 DIAGNOSIS — Z20822 Contact with and (suspected) exposure to covid-19: Secondary | ICD-10-CM | POA: Diagnosis not present

## 2020-12-22 DIAGNOSIS — Z4689 Encounter for fitting and adjustment of other specified devices: Secondary | ICD-10-CM | POA: Diagnosis not present

## 2020-12-22 DIAGNOSIS — N8111 Cystocele, midline: Secondary | ICD-10-CM | POA: Diagnosis not present

## 2020-12-22 DIAGNOSIS — N812 Incomplete uterovaginal prolapse: Secondary | ICD-10-CM | POA: Diagnosis not present

## 2020-12-26 DIAGNOSIS — H02889 Meibomian gland dysfunction of unspecified eye, unspecified eyelid: Secondary | ICD-10-CM | POA: Diagnosis not present

## 2020-12-26 DIAGNOSIS — H1045 Other chronic allergic conjunctivitis: Secondary | ICD-10-CM | POA: Diagnosis not present

## 2020-12-26 DIAGNOSIS — H04129 Dry eye syndrome of unspecified lacrimal gland: Secondary | ICD-10-CM | POA: Diagnosis not present

## 2020-12-26 DIAGNOSIS — H2512 Age-related nuclear cataract, left eye: Secondary | ICD-10-CM | POA: Diagnosis not present

## 2020-12-28 ENCOUNTER — Ambulatory Visit (INDEPENDENT_AMBULATORY_CARE_PROVIDER_SITE_OTHER): Payer: Medicare Other

## 2020-12-28 VITALS — Ht 69.0 in | Wt 138.0 lb

## 2020-12-28 DIAGNOSIS — Z0000 Encounter for general adult medical examination without abnormal findings: Secondary | ICD-10-CM

## 2020-12-28 NOTE — Patient Instructions (Addendum)
Julie Rivera , Thank you for taking time to come for your Medicare Wellness Visit. I appreciate your ongoing commitment to your health goals. Please review the following plan we discussed and let me know if I can assist you in the future.   These are the goals we discussed:  Goals      Follow up with provider     As needed        This is a list of the screening recommended for you and due dates:  Health Maintenance  Topic Date Due   COVID-19 Vaccine (4 - Booster for Pfizer series) 01/13/2021*   Flu Shot  07/06/2021*   Mammogram  10/17/2022   Colon Cancer Screening  04/08/2024   Tetanus Vaccine  07/06/2026   Zoster (Shingles) Vaccine  Completed   HPV Vaccine  Aged Out   DEXA scan (bone density measurement)  Discontinued   Hepatitis C Screening: USPSTF Recommendation to screen - Ages 40-79 yo.  Discontinued  *Topic was postponed. The date shown is not the original due date.    Advanced directives: on file  Conditions/risks identified: none new  Next appointment: Follow up in one year for your annual wellness visit    Preventive Care 65 Years and Older, Female Preventive care refers to lifestyle choices and visits with your health care provider that can promote health and wellness. What does preventive care include? A yearly physical exam. This is also called an annual well check. Dental exams once or twice a year. Routine eye exams. Ask your health care provider how often you should have your eyes checked. Personal lifestyle choices, including: Daily care of your teeth and gums. Regular physical activity. Eating a healthy diet. Avoiding tobacco and drug use. Limiting alcohol use. Practicing safe sex. Taking low-dose aspirin every day. Taking vitamin and mineral supplements as recommended by your health care provider. What happens during an annual well check? The services and screenings done by your health care provider during your annual well check will depend on your  age, overall health, lifestyle risk factors, and family history of disease. Counseling  Your health care provider may ask you questions about your: Alcohol use. Tobacco use. Drug use. Emotional well-being. Home and relationship well-being. Sexual activity. Eating habits. History of falls. Memory and ability to understand (cognition). Work and work Statistician. Reproductive health. Screening  You may have the following tests or measurements: Height, weight, and BMI. Blood pressure. Lipid and cholesterol levels. These may be checked every 5 years, or more frequently if you are over 76 years old. Skin check. Lung cancer screening. You may have this screening every year starting at age 51 if you have a 30-pack-year history of smoking and currently smoke or have quit within the past 15 years. Fecal occult blood test (FOBT) of the stool. You may have this test every year starting at age 72. Flexible sigmoidoscopy or colonoscopy. You may have a sigmoidoscopy every 5 years or a colonoscopy every 10 years starting at age 6. Hepatitis C blood test. Hepatitis B blood test. Sexually transmitted disease (STD) testing. Diabetes screening. This is done by checking your blood sugar (glucose) after you have not eaten for a while (fasting). You may have this done every 1-3 years. Bone density scan. This is done to screen for osteoporosis. You may have this done starting at age 87. Mammogram. This may be done every 1-2 years. Talk to your health care provider about how often you should have regular mammograms. Talk with your  health care provider about your test results, treatment options, and if necessary, the need for more tests. Vaccines  Your health care provider may recommend certain vaccines, such as: Influenza vaccine. This is recommended every year. Tetanus, diphtheria, and acellular pertussis (Tdap, Td) vaccine. You may need a Td booster every 10 years. Zoster vaccine. You may need this after  age 44. Pneumococcal 13-valent conjugate (PCV13) vaccine. One dose is recommended after age 40. Pneumococcal polysaccharide (PPSV23) vaccine. One dose is recommended after age 107. Talk to your health care provider about which screenings and vaccines you need and how often you need them. This information is not intended to replace advice given to you by your health care provider. Make sure you discuss any questions you have with your health care provider. Document Released: 04/21/2015 Document Revised: 12/13/2015 Document Reviewed: 01/24/2015 Elsevier Interactive Patient Education  2017 [place] Prevention in the Home Falls can cause injuries. They can happen to people of all ages. There are many things you can do to make your home safe and to help prevent falls. What can I do on the outside of my home? Regularly fix the edges of walkways and driveways and fix any cracks. Remove anything that might make you trip as you walk through a door, such as a raised step or threshold. Trim any bushes or trees on the path to your home. Use bright outdoor lighting. Clear any walking paths of anything that might make someone trip, such as rocks or tools. Regularly check to see if handrails are loose or broken. Make sure that both sides of any steps have handrails. Any raised decks and porches should have guardrails on the edges. Have any leaves, snow, or ice cleared regularly. Use sand or salt on walking paths during winter. Clean up any spills in your garage right away. This includes oil or grease spills. What can I do in the bathroom? Use night lights. Install grab bars by the toilet and in the tub and shower. Do not use towel bars as grab bars. Use non-skid mats or decals in the tub or shower. If you need to sit down in the shower, use a plastic, non-slip stool. Keep the floor dry. Clean up any water that spills on the floor as soon as it happens. Remove soap buildup in the tub or shower  regularly. Attach bath mats securely with double-sided non-slip rug tape. Do not have throw rugs and other things on the floor that can make you trip. What can I do in the bedroom? Use night lights. Make sure that you have a light by your bed that is easy to reach. Do not use any sheets or blankets that are too big for your bed. They should not hang down onto the floor. Have a firm chair that has side arms. You can use this for support while you get dressed. Do not have throw rugs and other things on the floor that can make you trip. What can I do in the kitchen? Clean up any spills right away. Avoid walking on wet floors. Keep items that you use a lot in easy-to-reach places. If you need to reach something above you, use a strong step stool that has a grab bar. Keep electrical cords out of the way. Do not use floor polish or wax that makes floors slippery. If you must use wax, use non-skid floor wax. Do not have throw rugs and other things on the floor that can make you trip. What  can I do with my stairs? Do not leave any items on the stairs. Make sure that there are handrails on both sides of the stairs and use them. Fix handrails that are broken or loose. Make sure that handrails are as long as the stairways. Check any carpeting to make sure that it is firmly attached to the stairs. Fix any carpet that is loose or worn. Avoid having throw rugs at the top or bottom of the stairs. If you do have throw rugs, attach them to the floor with carpet tape. Make sure that you have a light switch at the top of the stairs and the bottom of the stairs. If you do not have them, ask someone to add them for you. What else can I do to help prevent falls? Wear shoes that: Do not have high heels. Have rubber bottoms. Are comfortable and fit you well. Are closed at the toe. Do not wear sandals. If you use a stepladder: Make sure that it is fully opened. Do not climb a closed stepladder. Make sure that  both sides of the stepladder are locked into place. Ask someone to hold it for you, if possible. Clearly mark and make sure that you can see: Any grab bars or handrails. First and last steps. Where the edge of each step is. Use tools that help you move around (mobility aids) if they are needed. These include: Canes. Walkers. Scooters. Crutches. Turn on the lights when you go into a dark area. Replace any light bulbs as soon as they burn out. Set up your furniture so you have a clear path. Avoid moving your furniture around. If any of your floors are uneven, fix them. If there are any pets around you, be aware of where they are. Review your medicines with your doctor. Some medicines can make you feel dizzy. This can increase your chance of falling. Ask your doctor what other things that you can do to help prevent falls. This information is not intended to replace advice given to you by your health care provider. Make sure you discuss any questions you have with your health care provider. Document Released: 01/19/2009 Document Revised: 08/31/2015 Document Reviewed: 04/29/2014 Elsevier Interactive Patient Education  2017 [employer].

## 2020-12-28 NOTE — Progress Notes (Addendum)
Subjective:   Julie Rivera is a 75 y.o. female who presents for Medicare Annual (Subsequent) preventive examination.  Review of Systems    No ROS.  Medicare Wellness Virtual Visit.  Visual/audio telehealth visit, UTA vital signs.   See social history for additional risk factors.   Cardiac Risk Factors include: advanced age (>35men, >71 women)     Objective:    Today's Vitals   12/28/20 1117  Weight: 138 lb (62.6 kg)  Height: 5\' 9"  (1.753 m)   Body mass index is 20.38 kg/m.  Advanced Directives 12/28/2020 12/28/2019 11/01/2019 08/19/2019 07/26/2019 05/26/2019 12/25/2018  Does Patient Have a Medical Advance Directive? Yes Yes Yes Yes Yes Yes Yes  Type of Paramedic of [place];Living will [place];Living will [place];Living will [place];Living will Living will Healthcare Power of [place];Living will  Does patient want to make changes to medical advance directive? No - Patient declined No - Patient declined - No - Patient declined No - Patient declined No - Patient declined No - Patient declined  Copy of [place] in Chart? Yes - validated most recent copy scanned in chart (See row information) No - copy requested - No - copy requested - No - copy requested No - copy requested  Would patient like information on creating a medical advance directive? - - - - No - Patient declined - -    Current Medications (verified) Outpatient Encounter Medications as of 12/28/2020  Medication Sig   EPINEPHrine 0.3 mg/0.3 mL IJ SOAJ injection Inject 0.3 mg into the muscle as needed for anaphylaxis.   zolpidem (AMBIEN) 10 MG tablet TAKE 1 TABLET BY MOUTH AT BEDTIME AS NEEDED   No facility-administered encounter medications on file as of 12/28/2020.    Allergies (verified) Wasp venom and Gabapentin   History: Past Medical History:  Diagnosis Date   Actinic  keratosis    Allergy    Basal cell carcinoma 03/22/2019   L nasal tip   Basal cell carcinoma 03/26/2010   L lat upper chest, L chest infer clavicle   Basal cell carcinoma 09/12/2009   L medial pretibial   Basal cell carcinoma 01/03/2020   L alar crease   Chicken pox    Kidney stones    Squamous cell carcinoma of skin 04/22/2016   R ant thigh medial, R ant thigh lateral   Past Surgical History:  Procedure Laterality Date   BACK SURGERY  2017   BREAST LUMPECTOMY WITH RADIOACTIVE SEED AND SENTINEL LYMPH NODE BIOPSY Left 07/29/2019   Procedure: LEFT BREAST LUMPECTOMY WITH RADIOACTIVE SEED AND SENTINEL LYMPH NODE BIOPSY;  Surgeon: Stark Klein, MD;  Location: [facility];  Service: General;  Laterality: Left;  PEC BLOCK   KNEE ARTHROCENTESIS     RADIOACTIVE SEED GUIDED EXCISIONAL BREAST BIOPSY Right 07/29/2019   Procedure: RIGHT RADIOACTIVE SEED GUIDED EXCISIONAL BREAST BIOPSY;  Surgeon: Stark Klein, MD;  Location: [facility];  Service: General;  Laterality: Right;  PEC BLOCK   SHOULDER SURGERY     TONSILLECTOMY     Family History  Problem Relation Age of Onset   Hypertension Sister    Sudden death Neg Hx    Hyperlipidemia Neg Hx    Heart attack Neg Hx    Diabetes Neg Hx    Ovarian cancer Neg Hx    Breast cancer Neg Hx    Social History   Socioeconomic History   Marital  status: Married    Spouse name: Not on file   Number of children: Not on file   Years of education: Not on file   Highest education level: Not on file  Occupational History   Not on file  Tobacco Use   Smoking status: Never   Smokeless tobacco: Never  Vaping Use   Vaping Use: Never used  Substance and Sexual Activity   Alcohol use: No   Drug use: No   Sexual activity: Not on file  Other Topics Concern   Not on file  Social History Narrative   Production assistant, radio, retired      2 children      4 grandchildren            Social Determinants of Health   Financial Resource Strain: Low Risk     Difficulty of Paying Living Expenses: Not hard at all  Food Insecurity: No Food Insecurity   Worried About Charity fundraiser in the Last Year: Never true   Arboriculturist in the Last Year: Never true  Transportation Needs: No Transportation Needs   Lack of Transportation (Medical): No   Lack of Transportation (Non-Medical): No  Physical Activity: Sufficiently Active   Days of Exercise per Week: 7 days   Minutes of Exercise per Session: 60 min  Stress: No Stress Concern Present   Feeling of Stress : Not at all  Social Connections: Unknown   Frequency of Communication with Friends and Family: Not on file   Frequency of Social Gatherings with Friends and Family: Not on file   Attends Religious Services: Not on Electrical engineer or Organizations: Not on file   Attends Archivist Meetings: Not on file   Marital Status: Married    Tobacco Counseling Counseling given: Not Answered   Clinical Intake:  Pre-visit preparation completed: Yes        Diabetes: No  How often do you need to have someone help you when you read instructions, pamphlets, or other written materials from your doctor or pharmacy?: 1 - Never   Interpreter Needed?: No      Activities of Daily Living In your present state of health, do you have any difficulty performing the following activities: 12/28/2020  Hearing? N  Vision? N  Difficulty concentrating or making decisions? N  Walking or climbing stairs? N  Dressing or bathing? N  Doing errands, shopping? N  Preparing Food and eating ? N  Using the Toilet? N  In the past six months, have you accidently leaked urine? N  Do you have problems with loss of bowel control? N  Managing your Medications? N  Managing your Finances? N  Housekeeping or managing your Housekeeping? N  Some recent data might be hidden    Patient Care Team: Burnard Hawthorne, FNP as PCP - General (Family Medicine) Mauro Kaufmann, RN as Oncology Nurse  Navigator Rockwell Germany, RN as Oncology Nurse Navigator Stark Klein, MD as Consulting Physician (General Surgery) Nicholas Lose, MD as Consulting Physician (Hematology and Oncology) Gery Pray, MD as Consulting Physician (Radiation Oncology)  Indicate any recent [place] you may have received from other than [facility] providers in the past year (date may be approximate).     Assessment:   This is a routine wellness examination for [place].  I connected with Rafia today by telephone and verified that I am speaking with the correct person using two identifiers. Location patient: home Location  provider: work Persons participating in the virtual visit: patient, Marine scientist.    I discussed the limitations, risks, security and privacy concerns of performing an evaluation and management service by telephone and the availability of in person appointments. The patient expressed understanding and verbally consented to this telephonic visit.    Interactive audio and video telecommunications were attempted between this provider and patient, however failed, due to patient having technical difficulties OR patient did not have access to video capability.  We continued and completed visit with audio only.  Some vital signs may be absent or patient reported.   Hearing/Vision screen Hearing Screening - Comments:: Patient is able to hear conversational tones without difficulty.  No issues reported.  Vision Screening - Comments:: Followed by Dr. Ellin Mayhew They have regular follow up with the ophthalmologist  Dietary issues and exercise activities discussed: Current Exercise Habits: Home exercise routine, Time (Minutes): 60, Frequency (Times/Week): 7, Weekly Exercise (Minutes/Week): 420, Intensity: Moderate Healthy diet Good water intake   Goals Addressed             This Visit's Progress    Follow up with provider       As needed       Depression Screen PHQ 2/9 Scores 12/28/2020  12/28/2019 12/25/2018 11/11/2017 07/01/2016 03/14/2016  PHQ - 2 Score 0 0 0 0 0 0    Fall Risk Fall Risk  12/28/2020 05/15/2020 12/28/2019 12/25/2018 11/11/2017  Falls in the past year? 0 0 0 0 No  Number falls in past yr: - 0 0 - -  Injury with Fall? - 0 - - -  Follow up Falls evaluation completed Falls evaluation completed Falls evaluation completed - -    FALL RISK PREVENTION PERTAINING TO THE HOME: Adequate lighting in your home to reduce risk of falls? Yes   ASSISTIVE DEVICES UTILIZED TO PREVENT FALLS: Use of a cane, walker or w/c? No   TIMED UP AND GO: Was the test performed? No .   Cognitive Function: MMSE - Mini Mental State Exam 11/11/2017  Orientation to time 5  Orientation to Place 5  Registration 3  Attention/ Calculation 5  Recall 3  Language- name 2 objects 2  Language- repeat 1  Language- follow 3 step command 3  Language- read & follow direction 1  Write a sentence 1  Copy design 1  Total score 30     6CIT Screen 12/25/2018  What Year? 0 points  What month? 0 points  What time? 0 points  Count back from 20 0 points  Months in reverse 0 points  Repeat phrase 0 points  Total Score 0    Immunizations Immunization History  Administered Date(s) Administered   DTaP 07/05/2016   Influenza, High Dose Seasonal PF 12/17/2016   Influenza-Unspecified 12/13/2017, 01/20/2018, 12/30/2018   PFIZER Comirnaty(Gray Top)Covid-19 Tri-Sucrose Vaccine 04/29/2019, 05/20/2019   PFIZER(Purple Top)SARS-COV-2 Vaccination 02/07/2020   Pneumococcal Conjugate-13 04/09/2015   Pneumococcal Polysaccharide-23 07/05/2016   Tdap 07/05/2016   Zoster Recombinat (Shingrix) 12/12/2017, 02/16/2018   Zoster, Live 01/20/2017   Health Maintenance Health Maintenance  Topic Date Due   COVID-19 Vaccine (4 - Booster for [place] series) 01/13/2021 (Originally 05/01/2020)   INFLUENZA VACCINE  07/06/2021 (Originally 11/06/2020)   MAMMOGRAM  10/17/2022   COLONOSCOPY (Pts 45-87yrs Insurance coverage will  need to be confirmed)  04/08/2024   TETANUS/TDAP  07/06/2026   Zoster Vaccines- Shingrix  Completed   HPV VACCINES  Aged Out   DEXA SCAN  Discontinued   Hepatitis C Screening  Discontinued   Lung Cancer Screening: (Low Dose CT Chest recommended if Age 50-80 years, 30 pack-year currently smoking OR have quit w/in 15years.) does not qualify.   Vision Screening: Recommended annual ophthalmology exams for early detection of glaucoma and other disorders of the eye.  Dental Screening: Recommended annual dental exams for proper oral hygiene  Community Resource Referral / Chronic Care Management: CRR required this visit?  No   CCM required this visit?  No      Plan:   Keep all routine maintenance appointments.   I have personally reviewed and noted the following in the patient's chart:   Medical and social history Use of alcohol, tobacco or illicit drugs  Current medications and supplements including opioid prescriptions. Not taking opioid.  Functional ability and status Nutritional status Physical activity Advanced directives List of other physicians Hospitalizations, surgeries, and ER visits in previous 12 months Vitals Screenings to include cognitive, depression, and falls Referrals and appointments  In addition, I have reviewed and discussed with patient certain preventive protocols, quality metrics, and best practice recommendations. A written personalized care plan for preventive services as well as general preventive health recommendations were provided to patient via mychart.     OBrien-Blaney, Judia Arnott L, LPN   07/02/6145     I have reviewed the above information and agree with above.   Deborra Medina, MD

## 2021-01-01 DIAGNOSIS — H2512 Age-related nuclear cataract, left eye: Secondary | ICD-10-CM | POA: Diagnosis not present

## 2021-01-01 DIAGNOSIS — H1045 Other chronic allergic conjunctivitis: Secondary | ICD-10-CM | POA: Diagnosis not present

## 2021-01-01 DIAGNOSIS — H26491 Other secondary cataract, right eye: Secondary | ICD-10-CM | POA: Diagnosis not present

## 2021-01-01 DIAGNOSIS — H04129 Dry eye syndrome of unspecified lacrimal gland: Secondary | ICD-10-CM | POA: Diagnosis not present

## 2021-01-01 DIAGNOSIS — H02889 Meibomian gland dysfunction of unspecified eye, unspecified eyelid: Secondary | ICD-10-CM | POA: Diagnosis not present

## 2021-01-22 DIAGNOSIS — M2011 Hallux valgus (acquired), right foot: Secondary | ICD-10-CM | POA: Diagnosis not present

## 2021-01-30 ENCOUNTER — Ambulatory Visit: Payer: Medicare Other | Admitting: Hematology and Oncology

## 2021-02-05 NOTE — Progress Notes (Signed)
HEMATOLOGY-ONCOLOGY TELEPHONE VISIT PROGRESS NOTE  I connected with Julie Rivera on 02/06/2021 at 10:30 AM EDT by telephone and verified that I am speaking with the correct person using two identifiers.  I discussed the limitations, risks, security and privacy concerns of performing an evaluation and management service by telephone and the availability of in person appointments.  I also discussed with the patient that there may be a patient responsible charge related to this service. The patient expressed understanding and agreed to proceed.   History of Present Illness: Julie Rivera is a 75 y.o. female with above-mentioned history of left breast cancer who underwent a left lumpectomy, radiation, and is currently on antiestrogen therapy with tamoxifen after she could not tolerate anastrozole. She presents over the phone today for follow-up   Oncology History  Malignant neoplasm of upper-outer quadrant of left breast in female, estrogen receptor positive (HCC)  05/21/2019 Initial Diagnosis   Screening mammogram detected an asymmetry and calcifications 4.5 cm in the left breast. Diagnostic mammogram showed a 1.9cm mass and calcifications in the upper outer left breast.  Biopsy revealed grade 2 IDC ER/PR positive HER-2 negative with a Ki-67 of 5%   07/29/2019 Surgery   Left lumpectomy [facility]) ([id number]): IDC, grade 1, 2.4cm, with low grade DCIS, 2 left axillary lymph nodes negative. Broadly present at the anterior margin of specimen B and focally present at the inferior margin of specimen B. Focally present at the lateral margin of specimen B.   07/29/2019 Cancer Staging   Staging form: Breast, AJCC 8th Edition - Pathologic stage from 07/29/2019: Stage IA (pT2, pN0, cM0, G1, ER+, PR+, HER2-)   08/25/2019 - 09/23/2019 Radiation Therapy   The patient initially received a dose of 40.05 Gy in 15 fractions to the breast using whole-breast tangent fields. This was delivered using a 3-D  conformal technique. The pt received a boost delivering an additional 12 Gy in 6 fractions using a electron boost with electrons. The total dose was 52.05 Gy.   10/2019 -  Anti-estrogen oral therapy   Anastrozole, discontinued 12/31/19 due to insomnia, hot flashes, and joint pain; switched to tamoxifen 12/31/19     Observations/Objective:     Assessment Plan:  Malignant neoplasm of upper-outer quadrant of left breast in female, estrogen receptor positive (HCC) 05/21/2019: Screening mammogram detected an asymmetry and calcifications in the left breast calcifications span 4.5 cm the palpable lump inside of that measured 1.7 cm. Diagnostic mammogram showed a 1.9cm mass and calcifications in the upper outer left breast.  Biopsy revealed grade 2 invasive ductal carcinoma ER/PR positive HER-2 negative with a Ki-67 of 15%   07/29/2019:Left lumpectomy ([facility]): IDC, grade 1, 2.4cm, with low grade DCIS, 2 left axillary lymph nodes negative.  ER/PR positive HER-2 negative with a Ki-67 of 15% positive anterior and inferior margins   Treatment plan: Resection of the positive margins 1.  Adjuvant radiation therapy 08/26/19-09/17/19 2.  Followed by adjuvant antiestrogen therapy: anastrozole 1 mg daily  switched to letrozole stopped 12/19/19 changed to Tamoxifen 12/31/19 due to hot flashes and insomnia and knee pain and fatigue, patient could not tolerate even 10 mg of tamoxifen.  Antiestrogen therapy discontinued  2021  Severe hot flashes: Patient takes Ambien at bedtime. She has intolerance to gabapentin.  Unpleasant dreams resolved   Breast cancer surveillance:  Mammogram 10/16/2020: [facility]: Benign   Bone density 10/14/2019: T score -1.8  Dryness of eyes: seeing ophthalmology.  Return to clinic in 1 year for follow-up.  I discussed the assessment and treatment plan with the patient. The patient was provided an opportunity to ask questions and all were answered. The patient agreed with the plan and  demonstrated an understanding of the instructions. The patient was advised to call back or seek an in-person evaluation if the symptoms worsen or if the condition fails to improve as anticipated.   Total time spent: 11 mins including non-face to face time and time spent for planning, charting and coordination of care  Rulon Eisenmenger, MD 02/06/2021    I, Thana Ates, am acting as scribe for Nicholas Lose, MD.  I have reviewed the above documentation for accuracy and completeness, and I agree with the above.

## 2021-02-05 NOTE — Assessment & Plan Note (Signed)
05/21/2019:Screening mammogram detected an asymmetry and calcifications in the left breastcalcifications span 4.5 cm the palpable lump inside of that measured 1.7 cm. Diagnostic mammogram showed a 1.9cm mass and calcifications in the upper outer left breast.Biopsy revealed grade 2 invasive ductal carcinoma ER/PR positive HER-2 negative with a Ki-67 of 15%  07/29/2019:Left lumpectomy ([facility]): IDC, grade 1, 2.4cm, with low grade DCIS, 2 left axillary lymph nodes negative.ER/PR positive HER-2 negative with a Ki-67 of 15% positive anterior and inferior margins  Treatment plan: Resection of the positive margins 1.Adjuvant radiation therapy5/20/21-6/11/21 2.Followed by adjuvant antiestrogen therapy: anastrozole 1 mg dailyswitched toletrozole stopped 12/19/19 changed to Tamoxifen 12/31/19 due to hot flashes and insomnia and knee pain and fatigue, patient could not tolerate even 10 mg of tamoxifen.  Therefore we will discontinue further antiestrogen treatment.   Severe hot flashes: Patient takes Ambien at bedtime.  This is helping her significantly.  She has intolerance to gabapentin.  She developed profound nightmares when she took it.  Breast cancer surveillance:  Mammogram 10/16/2020: [facility]: Benign Breast exam: 02/06/21: Benign Bone density 10/14/2019: T score -1.8  Return to clinic in 1 year for follow-up.

## 2021-02-06 ENCOUNTER — Ambulatory Visit: Payer: Medicare Other | Admitting: Hematology and Oncology

## 2021-02-06 ENCOUNTER — Inpatient Hospital Stay: Payer: Medicare Other | Attending: Hematology and Oncology | Admitting: Hematology and Oncology

## 2021-02-06 ENCOUNTER — Other Ambulatory Visit: Payer: Self-pay

## 2021-02-06 DIAGNOSIS — Z170 Estrogen receptor positive status [ER+]: Secondary | ICD-10-CM

## 2021-02-06 DIAGNOSIS — C50412 Malignant neoplasm of upper-outer quadrant of left female breast: Secondary | ICD-10-CM

## 2021-02-14 DIAGNOSIS — H1045 Other chronic allergic conjunctivitis: Secondary | ICD-10-CM | POA: Diagnosis not present

## 2021-02-14 DIAGNOSIS — H02889 Meibomian gland dysfunction of unspecified eye, unspecified eyelid: Secondary | ICD-10-CM | POA: Diagnosis not present

## 2021-02-14 DIAGNOSIS — H04129 Dry eye syndrome of unspecified lacrimal gland: Secondary | ICD-10-CM | POA: Diagnosis not present

## 2021-02-14 DIAGNOSIS — H26491 Other secondary cataract, right eye: Secondary | ICD-10-CM | POA: Diagnosis not present

## 2021-02-14 DIAGNOSIS — H2512 Age-related nuclear cataract, left eye: Secondary | ICD-10-CM | POA: Diagnosis not present

## 2021-03-02 ENCOUNTER — Other Ambulatory Visit: Payer: Self-pay | Admitting: Family

## 2021-03-02 NOTE — Telephone Encounter (Signed)
LOV: 05/15/20 NOV: N/A

## 2021-03-14 DIAGNOSIS — H04123 Dry eye syndrome of bilateral lacrimal glands: Secondary | ICD-10-CM | POA: Diagnosis not present

## 2021-03-14 DIAGNOSIS — Z961 Presence of intraocular lens: Secondary | ICD-10-CM | POA: Diagnosis not present

## 2021-03-14 DIAGNOSIS — H0288B Meibomian gland dysfunction left eye, upper and lower eyelids: Secondary | ICD-10-CM | POA: Diagnosis not present

## 2021-03-14 DIAGNOSIS — H26491 Other secondary cataract, right eye: Secondary | ICD-10-CM | POA: Diagnosis not present

## 2021-03-14 DIAGNOSIS — H0288A Meibomian gland dysfunction right eye, upper and lower eyelids: Secondary | ICD-10-CM | POA: Diagnosis not present

## 2021-03-23 ENCOUNTER — Other Ambulatory Visit: Payer: Self-pay

## 2021-03-23 ENCOUNTER — Telehealth: Payer: Self-pay | Admitting: Family

## 2021-03-23 ENCOUNTER — Ambulatory Visit
Admission: RE | Admit: 2021-03-23 | Discharge: 2021-03-23 | Disposition: A | Discharge status: Home / Self Care | Payer: Medicare Other | Source: Ambulatory Visit

## 2021-03-23 VITALS — BP 120/74 | HR 106 | Temp 98.6°F | Resp 18

## 2021-03-23 DIAGNOSIS — U071 COVID-19: Secondary | ICD-10-CM | POA: Diagnosis not present

## 2021-03-23 MED ORDER — MOLNUPIRAVIR EUA 200MG CAPSULE: 4.0000 | ORAL_CAPSULE | Freq: Two times a day (BID) | ORAL | 0 refills | Status: AC

## 2021-03-23 NOTE — Telephone Encounter (Signed)
Access Nurse call in stating that Pt tested Covid positive. Access Nurse stated that Pt needs to be prescribe paxlovid for Pt symptoms. Pt is requesting callback.

## 2021-03-23 NOTE — Telephone Encounter (Signed)
I called and spoke with patient & after checking schedules I did not see one with [place] or within [place] today. Pt did not want to wait on care so I was able to make her an appointment with [place] across from [place] this evening at 7p.

## 2021-03-23 NOTE — ED Triage Notes (Addendum)
Pt tested positive with an At home Covid test today. She has fatigue, bodyaches, HA, and dizziness. Pt advised by her PCP to be seen for antiviral medication.

## 2021-03-23 NOTE — Telephone Encounter (Signed)
Patient tested positive for COVID today. Her symptoms are body aches, headache, burning eyes, dizzy, little cough, low grade fever. Patient was transferred to Access Nurse.

## 2021-03-23 NOTE — ED Provider Notes (Signed)
[facility]    CSN: [id number] Arrival date & time: 03/23/21  1824      History   Chief Complaint Chief Complaint  Patient presents with   Covid Positive    HPI Julie Rivera is a 75 y.o. female.  Patient presents with headache, body aches, fatigue, dizziness x 1 day.  She has felt warm but has not had a fever.  No rash, sore throat, cough, shortness of breath, vomiting, diarrhea, or other symptoms.  Treatment at home with Tylenol.  She tested positive for COVID at home today.  She contacted her PCPs office and was instructed to come to urgent care for treatment.  The history is provided by the patient and medical records.   Past Medical History:  Diagnosis Date   Actinic keratosis    Allergy    Basal cell carcinoma 03/22/2019   L nasal tip   Basal cell carcinoma 03/26/2010   L lat upper chest, L chest infer clavicle   Basal cell carcinoma 09/12/2009   L medial pretibial   Basal cell carcinoma 01/03/2020   L alar crease   Chicken pox    Kidney stones    Squamous cell carcinoma of skin 04/22/2016   R ant thigh medial, R ant thigh lateral    Patient Active Problem List   Diagnosis Date Noted   Malignant neoplasm of upper-outer quadrant of left breast in female, estrogen receptor positive ([place]) 05/21/2019   Acute pain of right knee 07/20/2018   HLD (hyperlipidemia) 07/20/2018   Other fatigue 01/12/2018   Low back pain 07/01/2016   Hormone replacement therapy 03/19/2016   Insomnia 03/14/2016   Screening for cardiovascular condition 03/14/2016   Synovial cyst of lumbar facet joint 02/15/2016   Thumb pain, left 11/30/2014   Calcium pyrophosphate crystal disease 10/18/2014   Patellofemoral chondrosis, left 10/18/2014   Other tear of lateral meniscus, current injury, left knee, subsequent encounter 10/05/2014   AC joint arthropathy 08/04/2014   Complete rupture of rotator cuff 03/19/2013   Bicipital tenosynovitis 03/18/2013   Disorder of bursae and  tendons in shoulder region 03/18/2013   Sprain and strain of other specified sites of shoulder and upper arm 03/18/2013   Impingement syndrome of right shoulder 01/26/2013   Shoulder joint pain 12/29/2012    Past Surgical History:  Procedure Laterality Date   BACK SURGERY  2017   BREAST LUMPECTOMY WITH RADIOACTIVE SEED AND SENTINEL LYMPH NODE BIOPSY Left 07/29/2019   Procedure: LEFT BREAST LUMPECTOMY WITH RADIOACTIVE SEED AND SENTINEL LYMPH NODE BIOPSY;  Surgeon: Stark Klein, MD;  Location: [place];  Service: General;  Laterality: Left;  PEC BLOCK   KNEE ARTHROCENTESIS     RADIOACTIVE SEED GUIDED EXCISIONAL BREAST BIOPSY Right 07/29/2019   Procedure: RIGHT RADIOACTIVE SEED GUIDED EXCISIONAL BREAST BIOPSY;  Surgeon: Stark Klein, MD;  Location: [facility];  Service: General;  Laterality: Right;  PEC BLOCK   SHOULDER SURGERY     TONSILLECTOMY      OB History   No obstetric history on file.      Home Medications    Prior to Admission medications   Medication Sig Start Date End Date Taking? Authorizing Provider  conjugated estrogens-medroxyprogesteron (PREMPHASE) TABS tablet Take 1 tablet by mouth daily. 10/24/14  Yes [provider]  molnupiravir EUA (LAGEVRIO) 200 mg CAPS capsule Take 4 capsules (800 mg total) by mouth 2 (two) times daily for 5 days. 03/23/21 03/28/21 Yes Sharion Balloon, NP  EPINEPHrine 0.3 mg/0.3 mL IJ  SOAJ injection Inject 0.3 mg into the muscle as needed for anaphylaxis. 11/23/20   Burnard Hawthorne, FNP  gatifloxacin (ZYMAXID) 0.5 % SOLN SMARTSIG:In Eye(s) 10/02/20   [provider]  tobramycin-dexamethasone [facility]) ophthalmic solution SMARTSIG:In Eye(s) 10/16/20   [provider]  TYRVAYA 0.03 MG/ACT SOLN Place into both nostrils. 03/22/21   [provider]  zolpidem (AMBIEN) 10 MG tablet TAKE 1 TABLET BY MOUTH AT BEDTIME AS NEEDED 03/05/21   Burnard Hawthorne, FNP    Family History Family History  Problem Relation Age of Onset    Hypertension Sister    Sudden death Neg Hx    Hyperlipidemia Neg Hx    Heart attack Neg Hx    Diabetes Neg Hx    Ovarian cancer Neg Hx    Breast cancer Neg Hx     Social History Social History   Tobacco Use   Smoking status: Never   Smokeless tobacco: Never  Vaping Use   Vaping Use: Never used  Substance Use Topics   Alcohol use: No   Drug use: No     Allergies   Wasp venom and Gabapentin   Review of Systems Review of Systems  Constitutional:  Positive for fatigue. Negative for chills and fever.  HENT:  Negative for ear pain and sore throat.   Respiratory:  Negative for cough and shortness of breath.   Cardiovascular:  Negative for chest pain and palpitations.  Gastrointestinal:  Negative for diarrhea and vomiting.  Skin:  Negative for color change and rash.  Neurological:  Positive for dizziness and headaches. Negative for weakness and numbness.  All other systems reviewed and are negative.   Physical Exam Triage Vital Signs ED Triage Vitals [03/23/21 1846]  Enc Vitals Group     BP 120/74     Pulse Rate (!) 106     Resp 18     Temp 98.6 F (37 C)     Temp src      SpO2 96 %     Weight      Height      Head Circumference      Peak Flow      Pain Score      Pain Loc      Pain Edu?      Excl. in [place]?    No data found.  Updated Vital Signs BP 120/74 (BP Location: Left Arm)    Pulse (!) 106    Temp 98.6 F (37 C)    Resp 18    LMP 02/07/2016    SpO2 96%   Visual Acuity Right Eye Distance:   Left Eye Distance:   Bilateral Distance:    Right Eye Near:   Left Eye Near:    Bilateral Near:     Physical Exam Vitals and nursing note reviewed.  Constitutional:      General: She is not in acute distress.    Appearance: She is well-developed. She is ill-appearing.  HENT:     Right Ear: Tympanic membrane normal.     Left Ear: Tympanic membrane normal.     Nose: Nose normal.     Mouth/Throat:     Mouth: Mucous membranes are moist.     Pharynx:  Oropharynx is clear.  Cardiovascular:     Rate and Rhythm: Normal rate and regular rhythm.     Heart sounds: Normal heart sounds.  Pulmonary:     Effort: Pulmonary effort is normal. No respiratory distress.  Breath sounds: Normal breath sounds.  Musculoskeletal:     Cervical back: Neck supple.  Skin:    General: Skin is warm and dry.  Neurological:     Mental Status: She is alert.  Psychiatric:        Mood and Affect: Mood normal.        Behavior: Behavior normal.     UC Treatments / Results  Labs (all labs ordered are listed, but only abnormal results are displayed) Labs Reviewed - No data to display  EKG   Radiology No results found.  Procedures Procedures (including critical care time)  Medications Ordered in UC Medications - No data to display  Initial Impression / Assessment and Plan / UC Course  I have reviewed the triage vital signs and the nursing notes.  Pertinent labs & imaging results that were available during my care of the patient were reviewed by me and considered in my medical decision making (see chart for details).   COVID-19.  Patient tested positive for COVID at home today.  Symptom onset yesterday.  No recent lab work in chart.  Treating with molnupiravir.  Discussed that this is an emergency authorized medication; discussed side effects.  Discussed other symptomatic treatment including Tylenol, rest, hydration.  Instructed patient to follow-up with her PCP if her symptoms are not improving.  ED precautions discussed.  Patient agrees to plan of care.   Final Clinical Impressions(s) / UC Diagnoses   Final diagnoses:  [id number]     Discharge Instructions      Take the molnupiravir as directed.    You should self-quarantine according to the CDC guidelines.  See attached.    Most people do not need to be re-tested at the end of the quarantine period.    Go to the emergency department if you have high fever, shortness of breath, severe  diarrhea, or other concerning symptoms.        ED Prescriptions     Medication Sig Dispense Auth. Provider   molnupiravir EUA (LAGEVRIO) 200 mg CAPS capsule Take 4 capsules (800 mg total) by mouth 2 (two) times daily for 5 days. 40 capsule Sharion Balloon, NP      PDMP not reviewed this encounter.   Sharion Balloon, NP 03/23/21 [phone]

## 2021-03-23 NOTE — Discharge Instructions (Addendum)
Take the molnupiravir as directed.    You should self-quarantine according to the CDC guidelines.  See attached.    Most people do not need to be re-tested at the end of the quarantine period.    Go to the emergency department if you have high fever, shortness of breath, severe diarrhea, or other concerning symptoms.

## 2021-03-26 NOTE — Telephone Encounter (Signed)
Please advise? Only thing I can do is schedule with Sharyn Lull this week. She went to [facility] [employer].

## 2021-03-26 NOTE — Telephone Encounter (Signed)
Pt was seen 12/16 at [facility] for covid. Pt is requesting medication for this. Due to no appts today pt is wanting to know does she need to be seen?

## 2021-03-26 NOTE — Telephone Encounter (Signed)
Called patient She continues to have cough , onset  5 days ago.  She was concerned that molnupiravir was not effective and preferred paxlovid  Advised to have paxlovid she would need renal function lab ordered and resulted today in order to start. I offered to order lab for her  She politely declines coming in for labs. I encouraged her to start molnupiravir as I disagreed and have felt medication to be effective, however most effective earlier started in course of disease.    Offered cough medication and she politely declines.   She will let me know if she needs anything else.

## 2021-03-26 NOTE — Telephone Encounter (Signed)
Call pt Urgent care sent in [place] 03/23/21 Did she not pick up?

## 2021-03-26 NOTE — Telephone Encounter (Signed)
Pt stated that she knew that molnupiravir but she took it Saturday & was not effective. She was told at [facility] that it was not as effective as Paxlovid. She wants the Paxlovid sent in her but I explained she has not had recent labs. Pt wants sent in & told her I would send you the message, but that you would not send this in & she would have to lab regardless.

## 2021-04-11 DIAGNOSIS — H0288B Meibomian gland dysfunction left eye, upper and lower eyelids: Secondary | ICD-10-CM | POA: Diagnosis not present

## 2021-04-11 DIAGNOSIS — H04123 Dry eye syndrome of bilateral lacrimal glands: Secondary | ICD-10-CM | POA: Diagnosis not present

## 2021-04-11 DIAGNOSIS — H26491 Other secondary cataract, right eye: Secondary | ICD-10-CM | POA: Diagnosis not present

## 2021-04-11 DIAGNOSIS — H0288A Meibomian gland dysfunction right eye, upper and lower eyelids: Secondary | ICD-10-CM | POA: Diagnosis not present

## 2021-04-19 DIAGNOSIS — Z20822 Contact with and (suspected) exposure to covid-19: Secondary | ICD-10-CM | POA: Diagnosis not present

## 2021-04-23 ENCOUNTER — Other Ambulatory Visit: Payer: Self-pay

## 2021-04-23 ENCOUNTER — Encounter: Payer: Self-pay | Admitting: Dermatology

## 2021-04-23 ENCOUNTER — Ambulatory Visit (INDEPENDENT_AMBULATORY_CARE_PROVIDER_SITE_OTHER): Payer: Medicare Other | Admitting: Dermatology

## 2021-04-23 DIAGNOSIS — L821 Other seborrheic keratosis: Secondary | ICD-10-CM

## 2021-04-23 DIAGNOSIS — L820 Inflamed seborrheic keratosis: Secondary | ICD-10-CM | POA: Diagnosis not present

## 2021-04-23 DIAGNOSIS — Z85828 Personal history of other malignant neoplasm of skin: Secondary | ICD-10-CM | POA: Diagnosis not present

## 2021-04-23 DIAGNOSIS — L578 Other skin changes due to chronic exposure to nonionizing radiation: Secondary | ICD-10-CM | POA: Diagnosis not present

## 2021-04-23 DIAGNOSIS — L570 Actinic keratosis: Secondary | ICD-10-CM

## 2021-04-23 DIAGNOSIS — L309 Dermatitis, unspecified: Secondary | ICD-10-CM

## 2021-04-23 NOTE — Patient Instructions (Signed)

## 2021-04-23 NOTE — Progress Notes (Signed)
Follow-Up Visit   Subjective  Julie Rivera is a 76 y.o. female who presents for the following: check sun exposed areas (Hx of BCC's SCC's, AK's) and hx of BCC (L alar crease, previous bx only in past, pt noticed a red spot coming back so she txted with 5FU for 2 weeks).  It has since cleared and area is still a little red.  The patient has spots, moles and lesions to be evaluated, some may be new or changing and the patient has concerns that these could be cancer.   The following portions of the chart were reviewed this encounter and updated as appropriate:       Review of Systems:  No other skin or systemic complaints except as noted in HPI or Assessment and Plan.  Objective  Well appearing patient in no apparent distress; mood and affect are within normal limits.  A focused examination was performed including face, arms, chest.. Relevant physical exam findings are noted in the Assessment and Plan.  L alar crease Mild erythema L alar crease  R perioral, alar crease Light pink scaly patch R perioral Pink scaliness alar crease  R dorsum hand x 3 (3) Pink scaly macules   L upper chest Stuck on waxy papule with erythema     Assessment & Plan   Seborrheic Keratoses - Stuck-on, waxy, tan-brown papules and/or plaques  - Benign-appearing - Discussed benign etiology and prognosis. - Observe - Call for any changes - chest  Actinic Damage - chronic, secondary to cumulative UV radiation exposure/sun exposure over time - diffuse scaly erythematous macules with underlying dyspigmentation - Recommend daily broad spectrum sunscreen SPF 30+ to sun-exposed areas, reapply every 2 hours as needed.  - Recommend staying in the shade or wearing long sleeves, sun glasses (UVA+UVB protection) and wide brim hats (4-inch brim around the entire circumference of the hat). - Call for new or changing lesions.   History of Basal Cell Carcinoma of the Skin - No evidence of recurrence  today L nasal tip - Recommend regular full body skin exams - Recommend daily broad spectrum sunscreen SPF 30+ to sun-exposed areas, reapply every 2 hours as needed.  - Call if any new or changing lesions are noted between office visits   History of Squamous Cell Carcinoma of the Skin - No evidence of recurrence today - Recommend regular full body skin exams - Recommend daily broad spectrum sunscreen SPF 30+ to sun-exposed areas, reapply every 2 hours as needed.  - Call if any new or changing lesions are noted between office visits    History of basal cell carcinoma (BCC) L alar crease  With previous bx only Pt recently txted with 5FU for 2 weeks, appears clear Observe for recurrence, discussed EDC if recurs  Dermatitis R perioral, alar crease  Possible Seborrheic  Start Eucrisa oint qd/bid sample x 2 given, Lot TACB exp 03/2023  Recommend moisturizer qd  AK (actinic keratosis) (3) R dorsum hand x 3  Destruction of lesion - R dorsum hand x 3  Destruction method: cryotherapy   Informed consent: discussed and consent obtained   Lesion destroyed using liquid nitrogen: Yes   Region frozen until ice ball extended beyond lesion: Yes   Outcome: patient tolerated procedure well with no complications   Post-procedure details: wound care instructions given   Additional details:  Prior to procedure, discussed risks of blister formation, small wound, skin dyspigmentation, or rare scar following cryotherapy. Recommend Vaseline ointment to treated areas while healing.  Inflamed seborrheic keratosis L upper chest  Start Eucrisa oint qd/bid, samples given   Return in about 6 months (around 10/21/2021) for TBSE, Hx of BCC, Hx of SCC, Hx of AKs.  I, Othelia Pulling, RMA, am acting as scribe for Brendolyn Patty, MD .  Documentation: I have reviewed the above documentation for accuracy and completeness, and I agree with the above.  Brendolyn Patty MD

## 2021-05-01 DIAGNOSIS — H02882 Meibomian gland dysfunction right lower eyelid: Secondary | ICD-10-CM | POA: Diagnosis not present

## 2021-05-01 DIAGNOSIS — H02881 Meibomian gland dysfunction right upper eyelid: Secondary | ICD-10-CM | POA: Diagnosis not present

## 2021-05-01 DIAGNOSIS — H02884 Meibomian gland dysfunction left upper eyelid: Secondary | ICD-10-CM | POA: Diagnosis not present

## 2021-05-01 DIAGNOSIS — H02885 Meibomian gland dysfunction left lower eyelid: Secondary | ICD-10-CM | POA: Diagnosis not present

## 2021-05-01 DIAGNOSIS — H04123 Dry eye syndrome of bilateral lacrimal glands: Secondary | ICD-10-CM | POA: Diagnosis not present

## 2021-05-01 DIAGNOSIS — Z961 Presence of intraocular lens: Secondary | ICD-10-CM | POA: Diagnosis not present

## 2021-05-01 DIAGNOSIS — H25812 Combined forms of age-related cataract, left eye: Secondary | ICD-10-CM | POA: Diagnosis not present

## 2021-05-09 DIAGNOSIS — Z20828 Contact with and (suspected) exposure to other viral communicable diseases: Secondary | ICD-10-CM | POA: Diagnosis not present

## 2021-06-07 DIAGNOSIS — H2512 Age-related nuclear cataract, left eye: Secondary | ICD-10-CM | POA: Diagnosis not present

## 2021-06-07 DIAGNOSIS — H524 Presbyopia: Secondary | ICD-10-CM | POA: Diagnosis not present

## 2021-06-07 DIAGNOSIS — H25812 Combined forms of age-related cataract, left eye: Secondary | ICD-10-CM | POA: Diagnosis not present

## 2021-06-07 DIAGNOSIS — H52202 Unspecified astigmatism, left eye: Secondary | ICD-10-CM | POA: Diagnosis not present

## 2021-06-07 DIAGNOSIS — H269 Unspecified cataract: Secondary | ICD-10-CM | POA: Diagnosis not present

## 2021-06-28 DIAGNOSIS — Z20822 Contact with and (suspected) exposure to covid-19: Secondary | ICD-10-CM | POA: Diagnosis not present

## 2021-07-01 DIAGNOSIS — Z20822 Contact with and (suspected) exposure to covid-19: Secondary | ICD-10-CM | POA: Diagnosis not present

## 2021-07-19 DIAGNOSIS — N812 Incomplete uterovaginal prolapse: Secondary | ICD-10-CM | POA: Diagnosis not present

## 2021-07-19 DIAGNOSIS — Z20822 Contact with and (suspected) exposure to covid-19: Secondary | ICD-10-CM | POA: Diagnosis not present

## 2021-07-19 DIAGNOSIS — N8111 Cystocele, midline: Secondary | ICD-10-CM | POA: Diagnosis not present

## 2021-07-19 DIAGNOSIS — Z4689 Encounter for fitting and adjustment of other specified devices: Secondary | ICD-10-CM | POA: Diagnosis not present

## 2021-08-06 DIAGNOSIS — Z20822 Contact with and (suspected) exposure to covid-19: Secondary | ICD-10-CM | POA: Diagnosis not present

## 2021-08-29 DIAGNOSIS — Z20828 Contact with and (suspected) exposure to other viral communicable diseases: Secondary | ICD-10-CM | POA: Diagnosis not present

## 2021-09-04 DIAGNOSIS — Z20828 Contact with and (suspected) exposure to other viral communicable diseases: Secondary | ICD-10-CM | POA: Diagnosis not present

## 2021-09-08 DIAGNOSIS — N390 Urinary tract infection, site not specified: Secondary | ICD-10-CM | POA: Diagnosis not present

## 2021-09-13 DIAGNOSIS — H1045 Other chronic allergic conjunctivitis: Secondary | ICD-10-CM | POA: Diagnosis not present

## 2021-09-13 DIAGNOSIS — H26491 Other secondary cataract, right eye: Secondary | ICD-10-CM | POA: Diagnosis not present

## 2021-09-13 DIAGNOSIS — H0288B Meibomian gland dysfunction left eye, upper and lower eyelids: Secondary | ICD-10-CM | POA: Diagnosis not present

## 2021-09-13 DIAGNOSIS — H0288A Meibomian gland dysfunction right eye, upper and lower eyelids: Secondary | ICD-10-CM | POA: Diagnosis not present

## 2021-09-13 DIAGNOSIS — H04129 Dry eye syndrome of unspecified lacrimal gland: Secondary | ICD-10-CM | POA: Diagnosis not present

## 2021-10-15 ENCOUNTER — Encounter: Payer: Self-pay | Admitting: Family

## 2021-10-15 DIAGNOSIS — R922 Inconclusive mammogram: Secondary | ICD-10-CM | POA: Diagnosis not present

## 2021-12-17 ENCOUNTER — Ambulatory Visit (INDEPENDENT_AMBULATORY_CARE_PROVIDER_SITE_OTHER): Payer: Medicare Other | Admitting: Dermatology

## 2021-12-17 ENCOUNTER — Encounter: Payer: Self-pay | Admitting: Dermatology

## 2021-12-17 DIAGNOSIS — D229 Melanocytic nevi, unspecified: Secondary | ICD-10-CM | POA: Diagnosis not present

## 2021-12-17 DIAGNOSIS — Z1283 Encounter for screening for malignant neoplasm of skin: Secondary | ICD-10-CM

## 2021-12-17 DIAGNOSIS — L570 Actinic keratosis: Secondary | ICD-10-CM | POA: Diagnosis not present

## 2021-12-17 DIAGNOSIS — L821 Other seborrheic keratosis: Secondary | ICD-10-CM

## 2021-12-17 DIAGNOSIS — L814 Other melanin hyperpigmentation: Secondary | ICD-10-CM | POA: Diagnosis not present

## 2021-12-17 DIAGNOSIS — Z85828 Personal history of other malignant neoplasm of skin: Secondary | ICD-10-CM

## 2021-12-17 DIAGNOSIS — D692 Other nonthrombocytopenic purpura: Secondary | ICD-10-CM

## 2021-12-17 DIAGNOSIS — L578 Other skin changes due to chronic exposure to nonionizing radiation: Secondary | ICD-10-CM

## 2021-12-17 DIAGNOSIS — W57XXXA Bitten or stung by nonvenomous insect and other nonvenomous arthropods, initial encounter: Secondary | ICD-10-CM | POA: Diagnosis not present

## 2021-12-17 DIAGNOSIS — L820 Inflamed seborrheic keratosis: Secondary | ICD-10-CM | POA: Diagnosis not present

## 2021-12-17 DIAGNOSIS — S0086XA Insect bite (nonvenomous) of other part of head, initial encounter: Secondary | ICD-10-CM | POA: Diagnosis not present

## 2021-12-17 DIAGNOSIS — D1801 Hemangioma of skin and subcutaneous tissue: Secondary | ICD-10-CM

## 2021-12-17 NOTE — Progress Notes (Signed)
Follow-Up Visit   Subjective  Julie Rivera is a 76 y.o. female who presents for the following: Total body skin exam (Hx of BCCs, SCC, AKs) and check spots.  The patient presents for Total-Body Skin Exam (TBSE) for skin cancer screening and mole check.  The patient has spots, moles and lesions to be evaluated, some may be new or changing and the patient has concerns that these could be cancer.   The following portions of the chart were reviewed this encounter and updated as appropriate:       Review of Systems:  No other skin or systemic complaints except as noted in HPI or Assessment and Plan.  Objective  Well appearing patient in no apparent distress; mood and affect are within normal limits.  A full examination was performed including scalp, head, eyes, ears, nose, lips, neck, chest, axillae, abdomen, back, buttocks, bilateral upper extremities, bilateral lower extremities, hands, feet, fingers, toes, fingernails, and toenails. All findings within normal limits unless otherwise noted below.  L post shoulder x 3, R top of shoulder x 1, R upper arm x 2, L upper arm x 2 (8) Stuck on waxy paps with erythema  R nasal tip x 1, R cheek x 1, L lower cheek x 1, L chest x 2, L alar crease x 1 (6) Pink scaly macules Pink scaly macule L anterior alar crease  L temple Pink edematous pap    Assessment & Plan   Lentigines - Scattered tan macules - Due to sun exposure - Benign-appearing, observe - Recommend daily broad spectrum sunscreen SPF 30+ to sun-exposed areas, reapply every 2 hours as needed. - Call for any changes - back  Seborrheic Keratoses - Stuck-on, waxy, tan-brown papules and/or plaques  - Benign-appearing - Discussed benign etiology and prognosis. - Observe - Call for any changes - back  Melanocytic Nevi - Tan-brown and/or pink-flesh-colored symmetric macules and papules - Benign appearing on exam today - Observation - Call clinic for new or changing  moles - Recommend daily use of broad spectrum spf 30+ sunscreen to sun-exposed areas.   Hemangiomas - Red papules - Discussed benign nature - Observe - Call for any changes - back  Actinic Damage - Chronic condition, secondary to cumulative UV/sun exposure - diffuse scaly erythematous macules with underlying dyspigmentation - Recommend daily broad spectrum sunscreen SPF 30+ to sun-exposed areas, reapply every 2 hours as needed.  - Staying in the shade or wearing long sleeves, sun glasses (UVA+UVB protection) and wide brim hats (4-inch brim around the entire circumference of the hat) are also recommended for sun protection.  - Call for new or changing lesions. - chest  Skin cancer screening performed today.   History of Basal Cell Carcinoma of the Skin - pink scaly macule at area of biopsy L alar crease, other sites clear - Recommend regular full body skin exams - Recommend daily broad spectrum sunscreen SPF 30+ to sun-exposed areas, reapply every 2 hours as needed.  - Call if any new or changing lesions are noted between office visits  - L nasal tip, L lat upper chest, L chest infer clavicle, L med pretibial, L alar crease - Recheck L alar crease on f/u AK vs recurrent BCC was treated with LN2 today.  History of Squamous Cell Carcinoma of the Skin - No evidence of recurrence today - Recommend regular full body skin exams - Recommend daily broad spectrum sunscreen SPF 30+ to sun-exposed areas, reapply every 2 hours as needed.  -  Call if any new or changing lesions are noted between office visits - R ant thigh medial, R ant thigh lateral  Purpura - Chronic; persistent and recurrent.  Treatable, but not curable. - Violaceous macules and patches - Benign - Related to trauma, age, sun damage and/or use of blood thinners, chronic use of topical and/or oral steroids - Observe - Can use OTC arnica containing moisturizer such as Dermend Bruise Formula if desired - Call for worsening  or other concerns   Inflamed seborrheic keratosis (8) L post shoulder x 3, R top of shoulder x 1, R upper arm x 2, L upper arm x 2  Symptomatic, irritating, patient would like treated.  Recheck R top of shoulder, L post shoulder on f/u  Destruction of lesion - L post shoulder x 3, R top of shoulder x 1, R upper arm x 2, L upper arm x 2  Destruction method: cryotherapy   Informed consent: discussed and consent obtained   Lesion destroyed using liquid nitrogen: Yes   Region frozen until ice ball extended beyond lesion: Yes   Outcome: patient tolerated procedure well with no complications   Post-procedure details: wound care instructions given   Additional details:  Prior to procedure, discussed risks of blister formation, small wound, skin dyspigmentation, or rare scar following cryotherapy. Recommend Vaseline ointment to treated areas while healing.   AK (actinic keratosis) (6) R nasal tip x 1, R cheek x 1, L lower cheek x 1, L chest x 2, L alar crease x 1  L alar crease AK vs Recurrent BCC, previously txted with 5FU, discussed repeat bx vs LN2, pt prefers LN2 today, if not clear on f/u recommend bx  Destruction of lesion - R nasal tip x 1, R cheek x 1, L lower cheek x 1, L chest x 2, L alar crease x 1  Destruction method: cryotherapy   Informed consent: discussed and consent obtained   Lesion destroyed using liquid nitrogen: Yes   Region frozen until ice ball extended beyond lesion: Yes   Outcome: patient tolerated procedure well with no complications   Post-procedure details: wound care instructions given   Additional details:  Prior to procedure, discussed risks of blister formation, small wound, skin dyspigmentation, or rare scar following cryotherapy. Recommend Vaseline ointment to treated areas while healing.   Bug bite without infection, initial encounter L temple  Benign, observe   Return in about 3 months (around 03/18/2022) for Recheck ISK R top of shoulder, L post  shoulder, Recheck AK vs Recurrent BCC L alar crease.  I, Othelia Pulling, RMA, am acting as scribe for Brendolyn Patty, MD .  Documentation: I have reviewed the above documentation for accuracy and completeness, and I agree with the above.  Brendolyn Patty MD

## 2021-12-17 NOTE — Patient Instructions (Addendum)
Cryotherapy Aftercare  Wash gently with soap and water everyday.   Apply Vaseline and Band-Aid daily until healed.     Due to recent changes in healthcare laws, you may see results of your pathology and/or laboratory studies on MyChart before the doctors have had a chance to review them. We understand that in some cases there may be results that are confusing or concerning to you. Please understand that not all results are received at the same time and often the doctors may need to interpret multiple results in order to provide you with the best plan of care or course of treatment. Therefore, we ask that you please give us 2 business days to thoroughly review all your results before contacting the office for clarification. Should we see a critical lab result, you will be contacted sooner.   If You Need Anything After Your Visit  If you have any questions or concerns for your doctor, please call our main line at [phone] and press option 4 to reach your doctor's medical assistant. If no one answers, please leave a voicemail as directed and we will return your call as soon as possible. Messages left after 4 pm will be answered the following business day.   You may also send us a message via MyChart. We typically respond to MyChart messages within 1-2 business days.  For prescription refills, please ask your pharmacy to contact our office. Our fax number is [phone].  If you have an urgent issue when the clinic is closed that cannot wait until the next business day, you can page your doctor at the number below.    Please note that while we do our best to be available for urgent issues outside of office hours, we are not available 24/7.   If you have an urgent issue and are unable to reach us, you may choose to seek medical care at your doctor's office, retail clinic, urgent care center, or emergency room.  If you have a medical emergency, please immediately call 911 or go to the  emergency department.  Pager Numbers  - Dr. Kowalski: [phone]  - Dr. Moye: [phone]  - Dr. Stewart: [phone]  In the event of inclement weather, please call our main line at [phone] for an update on the status of any delays or closures.  Dermatology Medication Tips: Please keep the boxes that topical medications come in in order to help keep track of the instructions about where and how to use these. Pharmacies typically print the medication instructions only on the boxes and not directly on the medication tubes.   If your medication is too expensive, please contact our office at [phone] option 4 or send us a message through MyChart.   We are unable to tell what your co-pay for medications will be in advance as this is different depending on your insurance coverage. However, we may be able to find a substitute medication at lower cost or fill out paperwork to get insurance to cover a needed medication.   If a prior authorization is required to get your medication covered by your insurance company, please allow us 1-2 business days to complete this process.  Drug prices often vary depending on where the prescription is filled and some pharmacies may offer cheaper prices.  The website www.goodrx.com contains coupons for medications through different pharmacies. The prices here do not account for what the cost may be with help from insurance (it may be cheaper with your insurance), but the website can   give you the price if you did not use any insurance.  - You can print the associated coupon and take it with your prescription to the pharmacy.  - You may also stop by our office during regular business hours and pick up a GoodRx coupon card.  - If you need your prescription sent electronically to a different pharmacy, notify our office through [facility] MyChart or by phone at [phone] option 4.     Si Usted Necesita Algo Despus de Su Visita  Tambin puede  enviarnos un mensaje a travs de MyChart. Por lo general respondemos a los mensajes de MyChart en el transcurso de 1 a 2 das hbiles.  Para renovar recetas, por favor pida a su farmacia que se ponga en contacto con nuestra oficina. Nuestro nmero de fax es el [phone].  Si tiene un asunto urgente cuando la clnica est cerrada y que no puede esperar hasta el siguiente da hbil, puede llamar/localizar a su doctor(a) al nmero que aparece a continuacin.   Por favor, tenga en cuenta que aunque hacemos todo lo posible para estar disponibles para asuntos urgentes fuera del horario de oficina, no estamos disponibles las 24 horas del da, los 7 das de la semana.   Si tiene un problema urgente y no puede comunicarse con nosotros, puede optar por buscar atencin mdica  en el consultorio de su doctor(a), en una clnica privada, en un centro de atencin urgente o en una sala de emergencias.  Si tiene una emergencia mdica, por favor llame inmediatamente al 911 o vaya a la sala de emergencias.  Nmeros de bper  - Dr. Kowalski: [phone]  - Dra. Moye: [phone]  - Dra. Stewart: [phone]  En caso de inclemencias del tiempo, por favor llame a nuestra lnea principal al [phone] para una actualizacin sobre el estado de cualquier retraso o cierre.  Consejos para la medicacin en dermatologa: Por favor, guarde las cajas en las que vienen los medicamentos de uso tpico para ayudarle a seguir las instrucciones sobre dnde y cmo usarlos. Las farmacias generalmente imprimen las instrucciones del medicamento slo en las cajas y no directamente en los tubos del medicamento.   Si su medicamento es muy caro, por favor, pngase en contacto con nuestra oficina llamando al [phone] y presione la opcin 4 o envenos un mensaje a travs de MyChart.   No podemos decirle cul ser su copago por los medicamentos por adelantado ya que esto es diferente dependiendo de la cobertura de su seguro.  Sin embargo, es posible que podamos encontrar un medicamento sustituto a menor costo o llenar un formulario para que el seguro cubra el medicamento que se considera necesario.   Si se requiere una autorizacin previa para que su compaa de seguros cubra su medicamento, por favor permtanos de 1 a 2 das hbiles para completar este proceso.  Los precios de los medicamentos varan con frecuencia dependiendo del lugar de dnde se surte la receta y alguna farmacias pueden ofrecer precios ms baratos.  El sitio web www.goodrx.com tiene cupones para medicamentos de diferentes farmacias. Los precios aqu no tienen en cuenta lo que podra costar con la ayuda del seguro (puede ser ms barato con su seguro), pero el sitio web puede darle el precio si no utiliz ningn seguro.  - Puede imprimir el cupn correspondiente y llevarlo con su receta a la farmacia.  - Tambin puede pasar por nuestra oficina durante el horario de atencin regular y recoger una tarjeta de cupones de GoodRx.  -   Si necesita que su receta se enve electrnicamente a una farmacia diferente, informe a nuestra oficina a travs de MyChart de [facility] o por telfono llamando al [phone] y presione la opcin 4.  

## 2022-01-02 ENCOUNTER — Telehealth: Payer: Self-pay

## 2022-01-02 ENCOUNTER — Telehealth: Payer: Self-pay | Admitting: Family

## 2022-01-02 NOTE — Telephone Encounter (Signed)
Patient notified and scheduled 

## 2022-01-02 NOTE — Telephone Encounter (Signed)
Copied from [place] [id number]. Topic: Medicare AWV >> Jan 02, 2022 10:28 AM Devoria Glassing wrote: Reason for CRM: Left message for patient to schedule Annual Wellness Visit.  Please schedule with Nurse Health Advisor Denisa O'Brien-Blaney, LPN at [facility]. This appt can be telephone or office visit.  Please call [phone] ask for [facility]

## 2022-01-02 NOTE — Telephone Encounter (Signed)
Patient left a VM on the nurse line stating that she has just recently heard about taking Minoxidil for improvement in hair growth and would like to know if we can send her in an Rx for it?

## 2022-01-15 DIAGNOSIS — Z4689 Encounter for fitting and adjustment of other specified devices: Secondary | ICD-10-CM | POA: Diagnosis not present

## 2022-01-15 DIAGNOSIS — N8111 Cystocele, midline: Secondary | ICD-10-CM | POA: Diagnosis not present

## 2022-01-15 DIAGNOSIS — N812 Incomplete uterovaginal prolapse: Secondary | ICD-10-CM | POA: Diagnosis not present

## 2022-01-16 ENCOUNTER — Telehealth: Payer: Self-pay | Admitting: Family

## 2022-01-16 NOTE — Telephone Encounter (Signed)
Copied from [place] [id number]. Topic: Medicare AWV >> Jan 16, 2022 11:53 AM Devoria Glassing wrote: Reason for CRM: Left message for patient to schedule Annual Wellness Visit.  Please schedule with Nurse Health Advisor Denisa O'Brien-Blaney, LPN at [facility]. This appt can be telephone or office visit.  Please call [phone] ask for [facility]

## 2022-01-25 ENCOUNTER — Telehealth: Payer: Self-pay | Admitting: Family

## 2022-01-25 NOTE — Telephone Encounter (Signed)
Copied from [place] [id number]. Topic: Medicare AWV >> Jan 25, 2022 11:38 AM Devoria Glassing wrote: Reason for CRM: Left message for patient to schedule Annual Wellness Visit.  Please schedule with Nurse Health Advisor Denisa O'Brien-Blaney, LPN at [facility]. This appt can be telephone or office visit.  Please call [phone] ask for [facility]

## 2022-01-28 DIAGNOSIS — H1045 Other chronic allergic conjunctivitis: Secondary | ICD-10-CM | POA: Diagnosis not present

## 2022-01-28 DIAGNOSIS — H01005 Unspecified blepharitis left lower eyelid: Secondary | ICD-10-CM | POA: Diagnosis not present

## 2022-01-28 DIAGNOSIS — H26491 Other secondary cataract, right eye: Secondary | ICD-10-CM | POA: Diagnosis not present

## 2022-01-28 DIAGNOSIS — H01002 Unspecified blepharitis right lower eyelid: Secondary | ICD-10-CM | POA: Diagnosis not present

## 2022-01-28 DIAGNOSIS — H04123 Dry eye syndrome of bilateral lacrimal glands: Secondary | ICD-10-CM | POA: Diagnosis not present

## 2022-01-29 ENCOUNTER — Ambulatory Visit (INDEPENDENT_AMBULATORY_CARE_PROVIDER_SITE_OTHER): Payer: Medicare Other | Admitting: Dermatology

## 2022-01-29 DIAGNOSIS — L578 Other skin changes due to chronic exposure to nonionizing radiation: Secondary | ICD-10-CM | POA: Diagnosis not present

## 2022-01-29 DIAGNOSIS — L820 Inflamed seborrheic keratosis: Secondary | ICD-10-CM

## 2022-01-29 DIAGNOSIS — L821 Other seborrheic keratosis: Secondary | ICD-10-CM

## 2022-01-29 DIAGNOSIS — D1801 Hemangioma of skin and subcutaneous tissue: Secondary | ICD-10-CM | POA: Diagnosis not present

## 2022-01-29 DIAGNOSIS — L649 Androgenic alopecia, unspecified: Secondary | ICD-10-CM

## 2022-01-29 MED ORDER — MINOXIDIL 2.5 MG PO TABS: 2.5000 mg | ORAL_TABLET | Freq: Every day | ORAL | 2 refills | Status: DC

## 2022-01-29 NOTE — Patient Instructions (Addendum)
Start minoxidil 2.5 mg tablet - take 1/2 tab by mouth daily    Doses of minoxidil for hair loss are considered 'low dose'. This is because the doses used for hair loss are a lot lower than the doses which are used for conditions such as high blood pressure (hypertension). The doses used for hypertension are 10-'40mg'$  per day.  Side effects are uncommon at the low doses (up to 2.5 mg/day) used to treat hair loss. Potential side effects, more commonly seen at higher doses, include: Increase in hair growth (hypertrichosis) elsewhere on face and body Temporary hair shedding upon starting medication which may last up to 4 weeks Ankle swelling, fluid retention, rapid weight gain more than 5 pounds Low blood pressure and feeling lightheaded or dizzy when standing up quickly Fast or irregular heartbeat Headaches  Female Androgenic Alopecia is a chronic condition related to genetics and/or hormonal changes.  In women androgenetic alopecia is commonly associated with menopause but may occur any time after puberty.  It causes hair thinning primarily on the crown with widening of the part and temporal hairline recession.  Can use OTC Rogaine (minoxidil) 5% solution/foam as directed.  Oral treatments in female patients who have no contraindication may include : - Low dose oral minoxidil 1.25 - '5mg'$  daily - Spironolactone 50 - '100mg'$  bid - Finasteride 2.5 - 5 mg daily Adjunctive therapies include: - Low Level Laser Light Therapy (LLLT) - Platelet-rich plasma injections (PRP) - Hair Transplants or scalp reduction    Cryotherapy Aftercare  Wash gently with soap and water everyday.   Apply Vaseline and Band-Aid daily until healed.      Seborrheic Keratosis  What causes seborrheic keratoses? Seborrheic keratoses are harmless, common skin growths that first appear during adult life.  As time goes by, more growths appear.  Some people may develop a large number of them.  Seborrheic keratoses appear on  both covered and uncovered body parts.  They are not caused by sunlight.  The tendency to develop seborrheic keratoses can be inherited.  They vary in color from skin-colored to gray, brown, or even black.  They can be either smooth or have a rough, warty surface.   Seborrheic keratoses are superficial and look as if they were stuck on the skin.  Under the microscope this type of keratosis looks like layers upon layers of skin.  That is why at times the top layer may seem to fall off, but the rest of the growth remains and re-grows.    Treatment Seborrheic keratoses do not need to be treated, but can easily be removed in the office.  Seborrheic keratoses often cause symptoms when they rub on clothing or jewelry.  Lesions can be in the way of shaving.  If they become inflamed, they can cause itching, soreness, or burning.  Removal of a seborrheic keratosis can be accomplished by freezing, burning, or surgery. If any spot bleeds, scabs, or grows rapidly, please return to have it checked, as these can be an indication of a skin cancer.    Due to recent changes in healthcare laws, you may see results of your pathology and/or laboratory studies on MyChart before the doctors have had a chance to review them. We understand that in some cases there may be results that are confusing or concerning to you. Please understand that not all results are received at the same time and often the doctors may need to interpret multiple results in order to provide you with the best plan  of care or course of treatment. Therefore, we ask that you please give [place] 2 business days to thoroughly review all your results before contacting the office for clarification. Should we see a critical lab result, you will be contacted sooner.   If You Need Anything After Your Visit  If you have any questions or concerns for your doctor, please call our main line at [phone] and press option 4 to reach your doctor's medical assistant. If  no one answers, please leave a voicemail as directed and we will return your call as soon as possible. Messages left after 4 pm will be answered the following business day.   You may also send [place] a message via [place]. We typically respond to MyChart messages within 1-2 business days.  For prescription refills, please ask your pharmacy to contact our office. Our fax number is [phone].  If you have an urgent issue when the clinic is closed that cannot wait until the next business day, you can page your doctor at the number below.    Please note that while we do our best to be available for urgent issues outside of office hours, we are not available 24/7.   If you have an urgent issue and are unable to reach [place], you may choose to seek medical care at your doctor's office, retail clinic, urgent care center, or emergency room.  If you have a medical emergency, please immediately call 911 or go to the emergency department.  Pager Numbers  - Dr. Nehemiah Massed: [phone]  - Dr. Laurence Ferrari: [phone]  - Dr. Nicole Kindred: [phone]  In the event of inclement weather, please call our main line at [phone] for an update on the status of any delays or closures.  Dermatology Medication Tips: Please keep the boxes that topical medications come in in order to help keep track of the instructions about where and how to use these. Pharmacies typically print the medication instructions only on the boxes and not directly on the medication tubes.   If your medication is too expensive, please contact our office at [phone] option 4 or send [place] a message through [place].   We are unable to tell what your co-pay for medications will be in advance as this is different depending on your insurance coverage. However, we may be able to find a substitute medication at lower cost or fill out paperwork to get insurance to cover a needed medication.   If a prior authorization is required to get your medication  covered by your insurance company, please allow [place] 1-2 business days to complete this process.  Drug prices often vary depending on where the prescription is filled and some pharmacies may offer cheaper prices.  The website www.goodrx.com contains coupons for medications through different pharmacies. The prices here do not account for what the cost may be with help from insurance (it may be cheaper with your insurance), but the website can give you the price if you did not use any insurance.  - You can print the associated coupon and take it with your prescription to the pharmacy.  - You may also stop by our office during regular business hours and pick up a GoodRx coupon card.  - If you need your prescription sent electronically to a different pharmacy, notify our office through [facility] or by phone at [phone] option 4.     Si Usted Necesita Algo Despus de Su Visita  Tambin puede enviarnos un mensaje a travs de Pharmacist, community. Por lo  general respondemos a los mensajes de MyChart en el transcurso de 1 a 2 das hbiles.  Para renovar recetas, por favor pida a su farmacia que se ponga en contacto con nuestra oficina. Harland Dingwall de fax es [place] [phone].  Si tiene un asunto urgente cuando la clnica est cerrada y que no puede esperar hasta el siguiente da hbil, puede llamar/localizar a su doctor(a) al nmero que aparece a continuacin.   Por favor, tenga en cuenta que aunque hacemos todo lo posible para estar disponibles para asuntos urgentes fuera del horario de [place], no estamos disponibles las 24 horas del da, los 7 das de la [place].   Si tiene un problema urgente y no puede comunicarse con nosotros, puede optar por buscar atencin mdica  en el consultorio de su doctor(a), en una clnica privada, en un centro de atencin urgente o en una sala de emergencias.  Si tiene Engineering geologist, por favor llame inmediatamente al 911 o vaya a la sala de  emergencias.  Nmeros de bper  - Dr. Nehemiah Massed: [phone]  - Dra. Moye: [phone]  - Dra. Nicole Kindred: [phone]  En caso de inclemencias del [place], por favor llame a Johnsie Kindred principal al [phone] para una actualizacin sobre el [place] de cualquier retraso o cierre.  Consejos para la medicacin en dermatologa: Por favor, guarde las cajas en las que vienen los medicamentos de uso tpico para ayudarle a seguir las instrucciones sobre dnde y cmo usarlos. Las farmacias generalmente imprimen las instrucciones del medicamento slo en las cajas y no directamente en los tubos del [place].   Si su medicamento es muy caro, por favor, pngase en contacto con Zigmund Daniel llamando al [phone] y presione la opcin 4 o envenos un mensaje a travs de Pharmacist, community.   No podemos decirle cul ser su copago por los medicamentos por adelantado ya que esto es diferente dependiendo de la cobertura de su seguro. Sin embargo, es posible que podamos encontrar un medicamento sustituto a Electrical engineer un formulario para que el seguro cubra el medicamento que se considera necesario.   Si se requiere una autorizacin previa para que su compaa de seguros [place] su medicamento, por favor permtanos de 1 a 2 das hbiles para completar este proceso.  Los precios de los medicamentos varan con frecuencia dependiendo del Environmental consultant de dnde se surte la receta y alguna farmacias pueden ofrecer precios ms baratos.  El sitio web www.goodrx.com tiene cupones para medicamentos de Airline pilot. Los precios aqu no tienen en cuenta lo que podra costar con la ayuda del seguro (puede ser ms barato con su seguro), pero el sitio web puede darle el precio si no utiliz Research scientist (physical sciences).  - Puede imprimir el cupn correspondiente y llevarlo con su receta a la farmacia.  - Tambin puede pasar por nuestra oficina durante el horario de atencin regular y Charity fundraiser una tarjeta de cupones de GoodRx.  - Si  necesita que su receta se enve electrnicamente a una farmacia diferente, informe a nuestra oficina a travs de MyChart de [place] o por telfono llamando al [phone] y presione la opcin 4.

## 2022-01-29 NOTE — Progress Notes (Signed)
Follow-Up Visit   Subjective  Julie Rivera is a 76 y.o. female who presents for the following: Alopecia (Patient would like to discuss starting oral minoxidil for hair loss. ).  She has had breast cancer treatment in the past with hair loss, and hair is thinner than before.  No itching or rash in scalp.  She also has growths in the scalp that she picks at.   The patient has spots, moles and lesions to be evaluated, some may be new or changing and the patient has concerns that these could be cancer.   The following portions of the chart were reviewed this encounter and updated as appropriate:      Review of Systems: No other skin or systemic complaints except as noted in HPI or Assessment and Plan.   Objective  Well appearing patient in no apparent distress; mood and affect are within normal limits.  A focused examination was performed including scalp. Relevant physical exam findings are noted in the Assessment and Plan.  frontal part of scalp x 3, posterior crown of  scalp x 2, (5) Erythematous stuck-on, waxy papules  Scalp Frontal/temporal scalp thinning with intact frontal hairline and miniaturization             Assessment & Plan  Inflamed seborrheic keratosis (5) frontal part of scalp x 3, posterior crown of  scalp x 2,  Symptomatic, irritating, patient would like treated.  Recheck on f/up  Destruction of lesion - frontal part of scalp x 3, posterior crown of  scalp x 2,  Destruction method: cryotherapy   Informed consent: discussed and consent obtained   Lesion destroyed using liquid nitrogen: Yes   Region frozen until ice ball extended beyond lesion: Yes   Outcome: patient tolerated procedure well with no complications   Post-procedure details: wound care instructions given   Additional details:  Prior to procedure, discussed risks of blister formation, small wound, skin dyspigmentation, or rare scar following cryotherapy. Recommend Vaseline ointment  to treated areas while healing.   Androgenetic alopecia Scalp  Chronic and persistent condition with duration or expected duration over one year. Condition is bothersome/symptomatic for patient.  Female Androgenic Alopecia is a chronic condition related to genetics and/or hormonal changes.  In women androgenetic alopecia is commonly associated with menopause but may occur any time after puberty.  It causes hair thinning primarily on the crown with widening of the part and temporal hairline recession.  Can use OTC Rogaine (minoxidil) 5% solution/foam as directed.  Oral treatments in female patients who have no contraindication may include : - Low dose oral minoxidil 1.25 - '5mg'$  daily - Spironolactone 50 - '100mg'$  bid - Finasteride 2.5 - 5 mg daily Adjunctive therapies include: - Low Level Laser Light Therapy (LLLT) - Platelet-rich plasma injections (PRP) - Hair Transplants or scalp reduction   BP today 132/80   Start minoxidil 2.5 mg tab take 1/2 by mouth daily   Doses of minoxidil for hair loss are considered 'low dose'. This is because the doses used for hair loss are a lot lower than the doses which are used for conditions such as high blood pressure (hypertension). The doses used for hypertension are 10-'40mg'$  per day.  Side effects are uncommon at the low doses (up to 2.5 mg/day) used to treat hair loss. Potential side effects, more commonly seen at higher doses, include: Increase in hair growth (hypertrichosis) elsewhere on face and body Temporary hair shedding upon starting medication which may last up to 4  weeks Ankle swelling, fluid retention, rapid weight gain more than 5 pounds Low blood pressure and feeling lightheaded or dizzy when standing up quickly Fast or irregular heartbeat Headaches  minoxidil (LONITEN) 2.5 MG tablet - Scalp Take 1 tablet (2.5 mg total) by mouth daily.   Seborrheic Keratoses - Stuck-on, waxy, tan-brown papules and/or plaques  - Benign-appearing -  Discussed benign etiology and prognosis. - Observe - Call for any changes  Hemangiomas - Red papules - Discussed benign nature - Observe - Call for any changes  Actinic Damage - chronic, secondary to cumulative UV radiation exposure/sun exposure over time - diffuse scaly erythematous macules with underlying dyspigmentation - Recommend daily broad spectrum sunscreen SPF 30+ to sun-exposed areas, reapply every 2 hours as needed.  - Recommend staying in the shade or wearing long sleeves, sun glasses (UVA+UVB protection) and wide brim hats (4-inch brim around the entire circumference of the hat). - Call for new or changing lesions.  Return in 10 weeks (on 04/09/2022) for  f/up alopecia, Isks scalp, R top of shoulder, L post shoulder, AK vs Recurrent BCC L alar crease. Garry Heater, CMA, am acting as scribe for Brendolyn Patty, MD.  Documentation: I have reviewed the above documentation for accuracy and completeness, and I agree with the above.  Brendolyn Patty MD

## 2022-02-02 NOTE — Progress Notes (Signed)
HEMATOLOGY-ONCOLOGY TELEPHONE VISIT PROGRESS NOTE  I connected with our patient on 02/06/22 at 10:15 AM EDT by telephone and verified that I am speaking with the correct person using two identifiers.  I discussed the limitations, risks, security and privacy concerns of performing an evaluation and management service by telephone and the availability of in person appointments.  I also discussed with the patient that there may be a patient responsible charge related to this service. The patient expressed understanding and agreed to proceed.   History of Present Illness:  Julie Rivera is a 76 y.o. female with above-mentioned history of left breast cancer who underwent a left lumpectomy, radiation, and is currently on antiestrogen therapy with tamoxifen after she could not tolerate anastrozole. She presents over the phone today for follow-up.  She reports that the hot flashes have improved significantly.  She no longer takes Ambien regularly.  Denies any lumps or nodules in the breast.  She is complaining of abdominal pain and back pain.    Oncology History  Malignant neoplasm of upper-outer quadrant of left breast in female, estrogen receptor positive ([place])  05/21/2019 Initial Diagnosis   Screening mammogram detected an asymmetry and calcifications 4.5 cm in the left breast. Diagnostic mammogram showed a 1.9cm mass and calcifications in the upper outer left breast.  Biopsy revealed grade 2 IDC ER/PR positive HER-2 negative with a Ki-67 of 5%   07/29/2019 Surgery   Left lumpectomy [facility]) ([id number]): IDC, grade 1, 2.4cm, with low grade DCIS, 2 left axillary lymph nodes negative. Broadly present at the anterior margin of specimen B and focally present at the inferior margin of specimen B. Focally present at the lateral margin of specimen B.   07/29/2019 Cancer Staging   Staging form: Breast, AJCC 8th Edition - Pathologic stage from 07/29/2019: Stage IA (pT2, pN0, cM0, G1, ER+, PR+, HER2-)    08/25/2019 - 09/23/2019 Radiation Therapy   The patient initially received a dose of 40.05 Gy in 15 fractions to the breast using whole-breast tangent fields. This was delivered using a 3-D conformal technique. The pt received a boost delivering an additional 12 Gy in 6 fractions using a electron boost with 60mV electrons. The total dose was 52.05 Gy.   10/2019 -  Anti-estrogen oral therapy   Anastrozole, discontinued 12/31/19 due to insomnia, hot flashes, and joint pain; switched to tamoxifen 12/31/19     REVIEW OF SYSTEMS:   Constitutional: Denies fevers, chills or abnormal weight loss All other systems were reviewed with the patient and are negative. Observations/Objective:     Assessment Plan:  Malignant neoplasm of upper-outer quadrant of left breast in female, [place] receptor positive [place]: Screening mammogram detected an asymmetry and calcifications in the left breast calcifications span 4.5 cm the palpable lump inside of that measured 1.7 cm. Diagnostic mammogram showed a 1.9cm mass and calcifications in the upper outer left breast.  Biopsy revealed grade 2 invasive ductal carcinoma ER/PR positive HER-2 negative with a Ki-67 of 15%   07/29/2019:Left lumpectomy ([place]): IDC, grade 1, 2.4cm, with low grade DCIS, 2 left axillary lymph nodes negative.  ER/PR positive HER-2 negative with a Ki-67 of 15% positive anterior and inferior margins   Treatment plan: Resection of the positive margins 1.  Adjuvant radiation therapy 08/26/19-09/17/19 2.  Followed by adjuvant antiestrogen therapy: anastrozole 1 mg daily  switched to letrozole stopped 12/19/19 changed to Tamoxifen 12/31/19 due to hot flashes and insomnia and knee pain and fatigue, patient could not tolerate even 10 mg  of tamoxifen.  Antiestrogen therapy discontinued  2021   Severe hot flashes: off Ambien regularly.Slightly better.   Breast cancer surveillance:  Mammogram 10/15/2021: [facility]: Benign, breast density category C    Bone density 10/14/2019: T score -1.8   Abdominal pain and back pain: Will obtain CT chest abdomen pelvis with contrast and I will talk to her couple days after the scan to go over the results. Return to clinic in 1 year for follow-up    I discussed the assessment and treatment plan with the patient. The patient was provided an opportunity to ask questions and all were answered. The patient agreed with the plan and demonstrated an understanding of the instructions. The patient was advised to call back or seek an in-person evaluation if the symptoms worsen or if the condition fails to improve as anticipated.   I provided 12.vgscr  minutes of non-face-to-face time during this encounter.  This includes time for charting and coordination of care   [name], MD

## 2022-02-06 ENCOUNTER — Inpatient Hospital Stay: Payer: Medicare Other | Attending: Hematology and Oncology | Admitting: Hematology and Oncology

## 2022-02-06 DIAGNOSIS — C50412 Malignant neoplasm of upper-outer quadrant of left female breast: Secondary | ICD-10-CM | POA: Diagnosis not present

## 2022-02-06 DIAGNOSIS — Z170 Estrogen receptor positive status [ER+]: Secondary | ICD-10-CM

## 2022-02-06 NOTE — Assessment & Plan Note (Signed)
05/21/2019:Screening mammogram detected an asymmetry and calcifications in the left breastcalcifications span 4.5 cm the palpable lump inside of that measured 1.7 cm. Diagnostic mammogram showed a 1.9cm mass and calcifications in the upper outer left breast.Biopsy revealed grade 2 invasive ductal carcinoma ER/PR positive HER-2 negative with a Ki-67 of 15%  07/29/2019:Left lumpectomy ([facility]): IDC, grade 1, 2.4cm, with low grade DCIS, 2 left axillary lymph nodes negative.ER/PR positive HER-2 negative with a Ki-67 of 15% positive anterior and inferior margins  Treatment plan: Resection of the positive margins 1.Adjuvant radiation therapy5/20/21-6/11/21 2.Followed by adjuvant antiestrogen therapy: anastrozole 1 mg dailyswitched toletrozole stopped 12/19/19 changed to Tamoxifen 9/24/21due to hot flashes and insomnia and knee pain and fatigue,patient could not tolerate even 10 mg of tamoxifen.  Antiestrogen therapy discontinued2021  Severe hot flashes: Patient takes Ambien at bedtime. She has intolerance to gabapentin. Unpleasant dreams resolved  Breast cancer surveillance:  Mammogram 10/15/2021: [facility]: Benign, breast density category C   Bone density 10/14/2019: T score -1.8 Dryness of eyes: seeing ophthalmology.  Return to clinic in 1 year for follow-up 

## 2022-02-27 ENCOUNTER — Telehealth: Payer: Self-pay | Admitting: Hematology and Oncology

## 2022-02-27 NOTE — Telephone Encounter (Signed)
Scheduled appointment per 11/1 los. Patient is aware.

## 2022-03-03 NOTE — Progress Notes (Signed)
HEMATOLOGY-ONCOLOGY TELEPHONE VISIT PROGRESS NOTE  I connected with our patient on 03/08/22 at 10:30 AM EST by telephone and verified that I am speaking with the correct person using two identifiers.  I discussed the limitations, risks, security and privacy concerns of performing an evaluation and management service by telephone and the availability of in person appointments.  I also discussed with the patient that there may be a patient responsible charge related to this service. The patient expressed understanding and agreed to proceed.   History of Present Illness:  Julie Rivera is a 76 y.o. female with a history of left breast cancer who underwent a left lumpectomy, radiation, and is currently on antiestrogen therapy with tamoxifen after she could not tolerate anastrozole. She presents over the phone today for follow-up to discuss ct scans..   Oncology History  Malignant neoplasm of upper-outer quadrant of left breast in female, estrogen receptor positive ([place])  05/21/2019 Initial Diagnosis   Screening mammogram detected an asymmetry and calcifications 4.5 cm in the left breast. Diagnostic mammogram showed a 1.9cm mass and calcifications in the upper outer left breast.  Biopsy revealed grade 2 IDC ER/PR positive HER-2 negative with a Ki-67 of 5%   07/29/2019 Surgery   Left lumpectomy [facility]) ([id number]): IDC, grade 1, 2.4cm, with low grade DCIS, 2 left axillary lymph nodes negative. Broadly present at the anterior margin of specimen B and focally present at the inferior margin of specimen B. Focally present at the lateral margin of specimen B.   07/29/2019 Cancer Staging   Staging form: Breast, AJCC 8th Edition - Pathologic stage from 07/29/2019: Stage IA (pT2, pN0, cM0, G1, ER+, PR+, HER2-)   08/25/2019 - 09/23/2019 Radiation Therapy   The patient initially received a dose of 40.05 Gy in 15 fractions to the breast using whole-breast tangent fields. This was delivered using a 3-D  conformal technique. The pt received a boost delivering an additional 12 Gy in 6 fractions using a electron boost with 50mV electrons. The total dose was 52.05 Gy.   10/2019 -  Anti-estrogen oral therapy   Anastrozole, discontinued 12/31/19 due to insomnia, hot flashes, and joint pain; switched to tamoxifen 12/31/19     REVIEW OF SYSTEMS:   Constitutional: Denies fevers, chills or abnormal weight loss All other systems were reviewed with the patient and are negative. Observations/Objective:     Assessment Plan:  Malignant neoplasm of upper-outer quadrant of left breast in female, estrogen receptor positive ([place] 05/21/2019: Screening mammogram detected an asymmetry and calcifications in the left breast calcifications span 4.5 cm the palpable lump inside of that measured 1.7 cm. Diagnostic mammogram showed a 1.9cm mass and calcifications in the upper outer left breast.  Biopsy revealed grade 2 invasive ductal carcinoma ER/PR positive HER-2 negative with a Ki-67 of 15%   07/29/2019:Left lumpectomy ([facility]): IDC, grade 1, 2.4cm, with low grade DCIS, 2 left axillary lymph nodes negative.  ER/PR positive HER-2 negative with a Ki-67 of 15% positive anterior and inferior margins   Treatment plan: Resection of the positive margins 1.  Adjuvant radiation therapy 08/26/19-09/17/19 2.  Followed by adjuvant antiestrogen therapy: anastrozole 1 mg daily  switched to letrozole stopped 12/19/19 changed to Tamoxifen 12/31/19 due to hot flashes and insomnia and knee pain and fatigue, patient could not tolerate even 10 mg of tamoxifen.  Antiestrogen therapy discontinued  2021   Severe hot flashes: off Ambien regularly.Slightly better.   Breast cancer surveillance:  Mammogram 10/15/2021: [facility]: Benign, breast density category C  Bone density 10/14/2019: T score -1.8   Abdominal pain and back pain: CT CAP 03/07/2022: Mild mediastinal and bilateral hilar lymphadenopathy new since 2019.  2.5 cm benign-appearing right  adnexal cyst.  For further evaluation of these lymph nodes we will obtain a PET CT scan (as recommended by radiology) and based on that we may have to refer her to pulmonary.  Telephone visit after the PET CT scan to discuss results  I discussed the assessment and treatment plan with the patient. The patient was provided an opportunity to ask questions and all were answered. The patient agreed with the plan and demonstrated an understanding of the instructions. The patient was advised to call back or seek an in-person evaluation if the symptoms worsen or if the condition fails to improve as anticipated.   I provided 12 minutes of non-face-to-face time during this encounter.  This includes time for charting and coordination of care   [name], MD  I Gardiner Coins am scribing for Dr. Lindi Adie  I have reviewed the above documentation for accuracy and completeness, and I agree with the above.

## 2022-03-06 ENCOUNTER — Ambulatory Visit (HOSPITAL_COMMUNITY)
Admission: RE | Admit: 2022-03-06 | Discharge: 2022-03-06 | Disposition: A | Discharge status: Home / Self Care | Payer: Medicare Other | Source: Ambulatory Visit | Attending: Hematology and Oncology | Admitting: Hematology and Oncology

## 2022-03-06 DIAGNOSIS — N281 Cyst of kidney, acquired: Secondary | ICD-10-CM | POA: Diagnosis not present

## 2022-03-06 DIAGNOSIS — K5730 Diverticulosis of large intestine without perforation or abscess without bleeding: Secondary | ICD-10-CM | POA: Diagnosis not present

## 2022-03-06 DIAGNOSIS — C50412 Malignant neoplasm of upper-outer quadrant of left female breast: Secondary | ICD-10-CM | POA: Diagnosis not present

## 2022-03-06 DIAGNOSIS — Z170 Estrogen receptor positive status [ER+]: Secondary | ICD-10-CM | POA: Insufficient documentation

## 2022-03-06 DIAGNOSIS — N200 Calculus of kidney: Secondary | ICD-10-CM | POA: Diagnosis not present

## 2022-03-06 DIAGNOSIS — C50912 Malignant neoplasm of unspecified site of left female breast: Secondary | ICD-10-CM | POA: Diagnosis not present

## 2022-03-06 DIAGNOSIS — R590 Localized enlarged lymph nodes: Secondary | ICD-10-CM | POA: Diagnosis not present

## 2022-03-06 DIAGNOSIS — R918 Other nonspecific abnormal finding of lung field: Secondary | ICD-10-CM | POA: Diagnosis not present

## 2022-03-06 DIAGNOSIS — I700 Atherosclerosis of aorta: Secondary | ICD-10-CM | POA: Diagnosis not present

## 2022-03-06 MED ORDER — IOHEXOL 300 MG/ML  SOLN
100.0000 mL | Freq: Once | INTRAMUSCULAR | Status: AC | PRN
  Administered 2022-03-06: 100 mL via INTRAVENOUS

## 2022-03-08 ENCOUNTER — Inpatient Hospital Stay: Payer: Medicare Other | Attending: Hematology and Oncology | Admitting: Hematology and Oncology

## 2022-03-08 DIAGNOSIS — Z170 Estrogen receptor positive status [ER+]: Secondary | ICD-10-CM | POA: Diagnosis not present

## 2022-03-08 DIAGNOSIS — C50412 Malignant neoplasm of upper-outer quadrant of left female breast: Secondary | ICD-10-CM | POA: Diagnosis not present

## 2022-03-08 NOTE — Assessment & Plan Note (Addendum)
05/21/2019: Screening mammogram detected an asymmetry and calcifications in the left breast calcifications span 4.5 cm the palpable lump inside of that measured 1.7 cm. Diagnostic mammogram showed a 1.9cm mass and calcifications in the upper outer left breast.  Biopsy revealed grade 2 invasive ductal carcinoma ER/PR positive HER-2 negative with a Ki-67 of 15%   07/29/2019:Left lumpectomy ([facility]): IDC, grade 1, 2.4cm, with low grade DCIS, 2 left axillary lymph nodes negative.  ER/PR positive HER-2 negative with a Ki-67 of 15% positive anterior and inferior margins   Treatment plan: Resection of the positive margins 1.  Adjuvant radiation therapy 08/26/19-09/17/19 2.  Followed by adjuvant antiestrogen therapy: anastrozole 1 mg daily  switched to letrozole stopped 12/19/19 changed to Tamoxifen 12/31/19 due to hot flashes and insomnia and knee pain and fatigue, patient could not tolerate even 10 mg of tamoxifen.  Antiestrogen therapy discontinued  2021   Severe hot flashes: off Ambien regularly.Slightly better.   Breast cancer surveillance:  Mammogram 10/15/2021: [facility]: Benign, breast density category C   Bone density 10/14/2019: T score -1.8   Abdominal pain and back pain: CT CAP 03/07/2022: Mild mediastinal and bilateral hilar lymphadenopathy new since 2019.  2.5 cm benign-appearing right adnexal cyst.  For further evaluation of these lymph nodes we will obtain a PET CT scan and I will refer her to pulmonary.

## 2022-03-11 ENCOUNTER — Telehealth: Payer: Self-pay | Admitting: Hematology and Oncology

## 2022-03-11 NOTE — Telephone Encounter (Signed)
Scheduled appointment per 12/1 los. Patient is aware.

## 2022-03-25 ENCOUNTER — Encounter (HOSPITAL_COMMUNITY)
Admission: RE | Admit: 2022-03-25 | Discharge: 2022-03-25 | Disposition: A | Discharge status: Home / Self Care | Payer: Medicare Other | Source: Ambulatory Visit | Attending: Hematology and Oncology | Admitting: Hematology and Oncology

## 2022-03-25 DIAGNOSIS — R911 Solitary pulmonary nodule: Secondary | ICD-10-CM | POA: Diagnosis not present

## 2022-03-25 DIAGNOSIS — Z170 Estrogen receptor positive status [ER+]: Secondary | ICD-10-CM | POA: Diagnosis not present

## 2022-03-25 DIAGNOSIS — C50412 Malignant neoplasm of upper-outer quadrant of left female breast: Secondary | ICD-10-CM | POA: Diagnosis not present

## 2022-03-25 LAB — GLUCOSE, CAPILLARY: Glucose-Capillary: 97 mg/dL (ref 70–99)

## 2022-03-25 MED ORDER — FLUDEOXYGLUCOSE F - 18 (FDG) INJECTION
6.9100 | Freq: Once | INTRAVENOUS | Status: AC
  Administered 2022-03-25: 6.91 via INTRAVENOUS

## 2022-03-26 NOTE — Progress Notes (Signed)
HEMATOLOGY-ONCOLOGY TELEPHONE VISIT PROGRESS NOTE  I connected with our patient on 03/27/22 at 10:15 AM EST by telephone and verified that I am speaking with the correct person using two identifiers.  I discussed the limitations, risks, security and privacy concerns of performing an evaluation and management service by telephone and the availability of in person appointments.  I also discussed with the patient that there may be a patient responsible charge related to this service. The patient expressed understanding and agreed to proceed.   History of Present Illness: Julie Rivera is a 76 y.o. female with a history of left breast cancer who underwent a left lumpectomy, radiation, and is currently on antiestrogen therapy with tamoxifen after she could not tolerate anastrozole. She presents over the phone today for follow-up.  Oncology History  Malignant neoplasm of upper-outer quadrant of left breast in female, estrogen receptor positive ([place])  05/21/2019 Initial Diagnosis   Screening mammogram detected an asymmetry and calcifications 4.5 cm in the left breast. Diagnostic mammogram showed a 1.9cm mass and calcifications in the upper outer left breast.  Biopsy revealed grade 2 IDC ER/PR positive HER-2 negative with a Ki-67 of 5%   07/29/2019 Surgery   Left lumpectomy [facility]) ([id number]): IDC, grade 1, 2.4cm, with low grade DCIS, 2 left axillary lymph nodes negative. Broadly present at the anterior margin of specimen B and focally present at the inferior margin of specimen B. Focally present at the lateral margin of specimen B.   07/29/2019 Cancer Staging   Staging form: Breast, AJCC 8th Edition - Pathologic stage from 07/29/2019: Stage IA (pT2, pN0, cM0, G1, ER+, PR+, HER2-)   08/25/2019 - 09/23/2019 Radiation Therapy   The patient initially received a dose of 40.05 Gy in 15 fractions to the breast using whole-breast tangent fields. This was delivered using a 3-D conformal technique. The pt  received a boost delivering an additional 12 Gy in 6 fractions using a electron boost with 55mV electrons. The total dose was 52.05 Gy.   10/2019 -  Anti-estrogen oral therapy   Anastrozole, discontinued 12/31/19 due to insomnia, hot flashes, and joint pain; switched to tamoxifen 12/31/19     REVIEW OF SYSTEMS:   Constitutional: Denies fevers, chills or abnormal weight loss All other systems were reviewed with the patient and are negative. Observations/Objective:     Assessment Plan:  Malignant neoplasm of upper-outer quadrant of left breast in female, estrogen receptor positive ([place] 05/21/2019: Screening mammogram detected an asymmetry and calcifications in the left breast calcifications span 4.5 cm the palpable lump inside of that measured 1.7 cm. Diagnostic mammogram showed a 1.9cm mass and calcifications in the upper outer left breast.  Biopsy revealed grade 2 invasive ductal carcinoma ER/PR positive HER-2 negative with a Ki-67 of 15%   07/29/2019:Left lumpectomy ([facility]): IDC, grade 1, 2.4cm, with low grade DCIS, 2 left axillary lymph nodes negative.  ER/PR positive HER-2 negative with a Ki-67 of 15% positive anterior and inferior margins   Treatment plan: Resection of the positive margins 1.  Adjuvant radiation therapy 08/26/19-09/17/19 2.  Followed by adjuvant antiestrogen therapy: anastrozole 1 mg daily  switched to letrozole stopped 12/19/19 changed to Tamoxifen 12/31/19 due to hot flashes and insomnia and knee pain and fatigue, patient could not tolerate even 10 mg of tamoxifen.  Antiestrogen therapy discontinued  2021   Severe hot flashes: off Ambien regularly.Slightly better.   Breast cancer surveillance:  Mammogram 10/15/2021: [facility]: Benign, breast density category C   Bone density 10/14/2019: T score -1.8  Abdominal pain and back pain: CT CAP 03/07/2022: Mild mediastinal and bilateral hilar lymphadenopathy new since 2019.  2.5 cm benign-appearing right adnexal cyst.   PET CT  scan 03/27/2022: Multiple mildly enlarged and hypermetabolic mediastinal and hilar lymph nodes are nonspecific.  Alternative diagnoses like sarcoidosis or inflammatory process should be considered.  Multiple small nodules bilaterally nonspecific.  I recommended consultation with pulmonary for tissue diagnosis.    I discussed the assessment and treatment plan with the patient. The patient was provided an opportunity to ask questions and all were answered. The patient agreed with the plan and demonstrated an understanding of the instructions. The patient was advised to call back or seek an in-person evaluation if the symptoms worsen or if the condition fails to improve as anticipated.   I provided 12 minutes of non-face-to-face time during this encounter.  This includes time for charting and coordination of care   [name], MD  I Gardiner Coins am acting as a scribe for Dr.Vinay Gudena  I have reviewed the above documentation for accuracy and completeness, and I agree with the above.

## 2022-03-27 ENCOUNTER — Inpatient Hospital Stay (HOSPITAL_BASED_OUTPATIENT_CLINIC_OR_DEPARTMENT_OTHER): Payer: Medicare Other | Admitting: Hematology and Oncology

## 2022-03-27 DIAGNOSIS — Z170 Estrogen receptor positive status [ER+]: Secondary | ICD-10-CM | POA: Diagnosis not present

## 2022-03-27 DIAGNOSIS — R590 Localized enlarged lymph nodes: Secondary | ICD-10-CM | POA: Diagnosis not present

## 2022-03-27 DIAGNOSIS — C50412 Malignant neoplasm of upper-outer quadrant of left female breast: Secondary | ICD-10-CM

## 2022-03-27 NOTE — Assessment & Plan Note (Addendum)
05/21/2019: Screening mammogram detected an asymmetry and calcifications in the left breast calcifications span 4.5 cm the palpable lump inside of that measured 1.7 cm. Diagnostic mammogram showed a 1.9cm mass and calcifications in the upper outer left breast.  Biopsy revealed grade 2 invasive ductal carcinoma ER/PR positive HER-2 negative with a Ki-67 of 15%   07/29/2019:Left lumpectomy ([facility]): IDC, grade 1, 2.4cm, with low grade DCIS, 2 left axillary lymph nodes negative.  ER/PR positive HER-2 negative with a Ki-67 of 15% positive anterior and inferior margins   Treatment plan: Resection of the positive margins 1.  Adjuvant radiation therapy 08/26/19-09/17/19 2.  Followed by adjuvant antiestrogen therapy: anastrozole 1 mg daily  switched to letrozole stopped 12/19/19 changed to Tamoxifen 12/31/19 due to hot flashes and insomnia and knee pain and fatigue, patient could not tolerate even 10 mg of tamoxifen.  Antiestrogen therapy discontinued  2021   Severe hot flashes: off Ambien regularly.Slightly better.   Breast cancer surveillance:  Mammogram 10/15/2021: [facility]: Benign, breast density category C   Bone density 10/14/2019: T score -1.8   Abdominal pain and back pain: CT CAP 03/07/2022: Mild mediastinal and bilateral hilar lymphadenopathy new since 2019.  2.5 cm benign-appearing right adnexal cyst.   PET CT scan 03/27/2022: Multiple mildly enlarged and hypermetabolic mediastinal and hilar lymph nodes are nonspecific.  Alternative diagnoses like sarcoidosis or inflammatory process should be considered.  Multiple small nodules bilaterally nonspecific.  I recommended consultation with pulmonary.

## 2022-03-28 ENCOUNTER — Telehealth: Payer: Self-pay | Admitting: Family

## 2022-03-28 NOTE — Telephone Encounter (Signed)
Copied from [place] [id number]. Topic: Medicare AWV >> Mar 28, 2022 11:19 AM Devoria Glassing wrote: Reason for CRM: Left message for patient to schedule Annual Wellness Visit.  Please schedule with Nurse Health Advisor Madelyn Brunner, LPN at [facility]. This appt can be telephone or office visit.  Please call [phone] ask for [facility]

## 2022-04-09 ENCOUNTER — Ambulatory Visit (INDEPENDENT_AMBULATORY_CARE_PROVIDER_SITE_OTHER): Payer: Medicare Other | Admitting: Dermatology

## 2022-04-09 VITALS — BP 120/77 | HR 75

## 2022-04-09 DIAGNOSIS — L219 Seborrheic dermatitis, unspecified: Secondary | ICD-10-CM

## 2022-04-09 DIAGNOSIS — C44311 Basal cell carcinoma of skin of nose: Secondary | ICD-10-CM | POA: Diagnosis not present

## 2022-04-09 DIAGNOSIS — L649 Androgenic alopecia, unspecified: Secondary | ICD-10-CM | POA: Diagnosis not present

## 2022-04-09 DIAGNOSIS — L820 Inflamed seborrheic keratosis: Secondary | ICD-10-CM | POA: Diagnosis not present

## 2022-04-09 MED ORDER — MINOXIDIL 2.5 MG PO TABS: 2.5000 mg | ORAL_TABLET | Freq: Every day | ORAL | 2 refills | Status: DC

## 2022-04-09 NOTE — Progress Notes (Signed)
Follow-Up Visit   Subjective  Julie Rivera is a 77 y.o. female who presents for the following: Alopecia (Patient currently taking 1/2 2.5 mg minoxidil tablet. ) and Follow-up (Patient here to have isk rechecked at b/l shoulders. Also recheck bx proven bcc at left alar crease. Patient reports a pink scaly area at left alar crease. ).  Patient also reports itchy area at upper mid back that she would like removed.  The patient has spots, moles and lesions to be evaluated, some may be new or changing and the patient has concerns that these could be cancer.    The following portions of the chart were reviewed this encounter and updated as appropriate:      Review of Systems: No other skin or systemic complaints except as noted in HPI or Assessment and Plan.   Objective  Well appearing patient in no apparent distress; mood and affect are within normal limits.  A focused examination was performed including scalp, b/l shoulders, left alar crease, face and back. Relevant physical exam findings are noted in the Assessment and Plan.  Head - Anterior (Face) mild pink scaliness b/l alar nasal crease, pink scaly macules upper lip and perioral  spinal upper back x 1 Erythematous stuck-on, waxy plaque, Isks have resolved at b/l shoulders  left alar crease Pink scaly macule        Scalp Frontal scalp thinning with intact frontal hairline and miniaturization, BL temporal recession             Assessment & Plan  Seborrheic dermatitis Head - Anterior (Face)  Chronic and persistent condition with duration or expected duration over one year. Condition is bothersome/symptomatic for patient. Currently flared.   Seborrheic Dermatitis  -  is a chronic persistent rash characterized by pinkness and scaling most commonly of the mid face but also can occur on the scalp (dandruff), ears; mid chest, mid back and groin.  It tends to be exacerbated by stress and cooler weather.  People  who have neurologic disease may experience new onset or exacerbation of existing seborrheic dermatitis.  The condition is not curable but treatable and can be controlled.  Start Zoryve cream to any affected areas of face Apply around lips and eyes for scaly dry areas daily as needed.   If not improved on f/up, recommend cryotherapy  Inflamed seborrheic keratosis spinal upper back x 1  Symptomatic, irritating, patient would like treated.    Destruction of lesion - spinal upper back x 1  Destruction method: cryotherapy   Informed consent: discussed and consent obtained   Lesion destroyed using liquid nitrogen: Yes   Region frozen until ice ball extended beyond lesion: Yes   Outcome: patient tolerated procedure well with no complications   Post-procedure details: wound care instructions given   Additional details:  Prior to procedure, discussed risks of blister formation, small wound, skin dyspigmentation, or rare scar following cryotherapy. Recommend Vaseline ointment to treated areas while healing.   Basal cell carcinoma (BCC) of skin of nose left alar crease  Destruction of lesion  Destruction method: electrodesiccation and curettage   Informed consent: discussed and consent obtained   Timeout:  patient name, date of birth, surgical site, and procedure verified Anesthesia: the lesion was anesthetized in a standard fashion   Anesthetic:  1% lidocaine w/ epinephrine 1-100,000 buffered w/ 8.4% NaHCO3 Curettage performed in three different directions: Yes   Electrodesiccation performed over the curetted area: Yes   Final wound size (cm):  0.8 Hemostasis  achieved with:  pressure, aluminum chloride and electrodesiccation Outcome: patient tolerated procedure well with no complications   Post-procedure details: wound care instructions given   Additional details:  0.8 cm post defect, Mupirocin ointment and Bandaid applied   Bx proven BCC nodular pattern 01/03/2020 Accession:  [id number]  Has been treated with 54fu cream prior, but not treated with EDC yet. Still has residual lesion. Recommend ED&C today    Androgenetic alopecia Scalp  Chronic and persistent condition with duration or expected duration over one year. Condition is bothersome/symptomatic for patient.  Tolerating minoxidil 1.25 mg PO qd x 1 mo.  No change noticed at this time.   Female Androgenic Alopecia is a chronic condition related to genetics and/or hormonal changes.  In women androgenetic alopecia is commonly associated with menopause but may occur any time after puberty.  It causes hair thinning primarily on the crown with widening of the part and temporal hairline recession.  Can use OTC Rogaine (minoxidil) 5% solution/foam as directed.  Oral treatments in female patients who have no contraindication may include : - Low dose oral minoxidil 1.25 - '5mg'$  daily - Spironolactone 50 - '100mg'$  bid - Finasteride 2.5 - 5 mg daily Adjunctive therapies include: - Low Level Laser Light Therapy (LLLT) - Platelet-rich plasma injections (PRP) - Hair Transplants or scalp reduction    BP today 120/77   Increase minoxidil 2.5 mg tab from 1/2 tab to 1 whole tab qd.    Doses of minoxidil for hair loss are considered 'low dose'. This is because the doses used for hair loss are a lot lower than the doses which are used for conditions such as high blood pressure (hypertension). The doses used for hypertension are 10-'40mg'$  per day.  Side effects are uncommon at the low doses (up to 2.5 mg/day) used to treat hair loss. Potential side effects, more commonly seen at higher doses, include: Increase in hair growth (hypertrichosis) elsewhere on face and body Temporary hair shedding upon starting medication which may last up to 4 weeks Ankle swelling, fluid retention, rapid weight gain more than 5 pounds Low blood pressure and feeling lightheaded or dizzy when standing up quickly Fast or irregular  heartbeat Headaches  minoxidil (LONITEN) 2.5 MG tablet - Scalp Take 1 tablet (2.5 mg total) by mouth daily.   Return in about 3 months (around 07/09/2022) for alopecia follow up, f/u BCC.  I, MRuthell Rummage CMA, am acting as scribe for TBrendolyn Patty MD.  Documentation: I have reviewed the above documentation for accuracy and completeness, and I agree with the above.  TBrendolyn PattyMD

## 2022-04-09 NOTE — Patient Instructions (Addendum)
For Hair Loss  Increase Minoxidil 2.5 mg tablet to taking 1 whole tablet daily   Doses of minoxidil for hair loss are considered 'low dose'. This is because the doses used for hair loss are a lot lower than the doses which are used for conditions such as high blood pressure (hypertension). The doses used for hypertension are 10-'40mg'$  per day.  Side effects are uncommon at the low doses (up to 2.5 mg/day) used to treat hair loss. Potential side effects, more commonly seen at higher doses, include: Increase in hair growth (hypertrichosis) elsewhere on face and body Temporary hair shedding upon starting medication which may last up to 4 weeks Ankle swelling, fluid retention, rapid weight gain more than 5 pounds Low blood pressure and feeling lightheaded or dizzy when standing up quickly Fast or irregular heartbeat Headaches  For seborrheic dermatitis at face  Can apply Zoryve cream sample to affected areas around lips and eyes daily as needed.      Electrodesiccation and Curettage ("Scrape and Burn") Wound Care Instructions  Leave the original bandage on for 24 hours if possible.  If the bandage becomes soaked or soiled before that time, it is OK to remove it and examine the wound.  A small amount of post-operative bleeding is normal.  If excessive bleeding occurs, remove the bandage, place gauze over the site and apply continuous pressure (no peeking) over the area for 30 minutes. If this does not work, please call our clinic as soon as possible or page your doctor if it is after hours.   Once a day, cleanse the wound with soap and water. It is fine to shower. If a thick crust develops you may use a Q-tip dipped into dilute hydrogen peroxide (mix 1:1 with water) to dissolve it.  Hydrogen peroxide can slow the healing process, so use it only as needed.    After washing, apply petroleum jelly (Vaseline) or an antibiotic ointment if your doctor prescribed one for you, followed by a bandage.     For best healing, the wound should be covered with a layer of ointment at all times. If you are not able to keep the area covered with a bandage to hold the ointment in place, this may mean re-applying the ointment several times a day.  Continue this wound care until the wound has healed and is no longer open. It may take several weeks for the wound to heal and close.  Itching and mild discomfort is normal during the healing process.  If you have any discomfort, you can take Tylenol (acetaminophen) or ibuprofen as directed on the bottle. (Please do not take these if you have an allergy to them or cannot take them for another reason).  Some redness, tenderness and white or yellow material in the wound is normal healing.  If the area becomes very sore and red, or develops a thick yellow-green material (pus), it may be infected; please notify us.    Wound healing continues for up to one year following surgery. It is not unusual to experience pain in the scar from time to time during the interval.  If the pain becomes severe or the scar thickens, you should notify the office.    A slight amount of redness in a scar is expected for the first six months.  After six months, the redness will fade and the scar will soften and fade.  The color difference becomes less noticeable with time.  If there are any problems, return for  a post-op surgery check at your earliest convenience.  To improve the appearance of the scar, you can use silicone scar gel, cream, or sheets (such as Mederma or Serica) every night for up to one year. These are available over the counter (without a prescription).  Please call our office at [phone] for any questions or concerns.    Cryotherapy Aftercare  Wash gently with soap and water everyday.   Apply Vaseline and Band-Aid daily until healed.   Seborrheic Keratosis  What causes seborrheic keratoses? Seborrheic keratoses are harmless, common skin growths that first  appear during adult life.  As time goes by, more growths appear.  Some people may develop a large number of them.  Seborrheic keratoses appear on both covered and uncovered body parts.  They are not caused by sunlight.  The tendency to develop seborrheic keratoses can be inherited.  They vary in color from skin-colored to gray, brown, or even black.  They can be either smooth or have a rough, warty surface.   Seborrheic keratoses are superficial and look as if they were stuck on the skin.  Under the microscope this type of keratosis looks like layers upon layers of skin.  That is why at times the top layer may seem to fall off, but the rest of the growth remains and re-grows.    Treatment Seborrheic keratoses do not need to be treated, but can easily be removed in the office.  Seborrheic keratoses often cause symptoms when they rub on clothing or jewelry.  Lesions can be in the way of shaving.  If they become inflamed, they can cause itching, soreness, or burning.  Removal of a seborrheic keratosis can be accomplished by freezing, burning, or surgery. If any spot bleeds, scabs, or grows rapidly, please return to have it checked, as these can be an indication of a skin cancer.     Due to recent changes in healthcare laws, you may see results of your pathology and/or laboratory studies on MyChart before the doctors have had a chance to review them. We understand that in some cases there may be results that are confusing or concerning to you. Please understand that not all results are received at the same time and often the doctors may need to interpret multiple results in order to provide you with the best plan of care or course of treatment. Therefore, we ask that you please give [place] 2 business days to thoroughly review all your results before contacting the office for clarification. Should we see a critical lab result, you will be contacted sooner.   If You Need Anything After Your Visit  If you have  any questions or concerns for your doctor, please call our main line at [phone] and press option 4 to reach your doctor's medical assistant. If no one answers, please leave a voicemail as directed and we will return your call as soon as possible. Messages left after 4 pm will be answered the following business day.   You may also send [place] a message via [place]. We typically respond to MyChart messages within 1-2 business days.  For prescription refills, please ask your pharmacy to contact our office. Our fax number is [phone].  If you have an urgent issue when the clinic is closed that cannot wait until the next business day, you can page your doctor at the number below.    Please note that while we do our best to be available for urgent issues outside of office hours,  we are not available 24/7.   If you have an urgent issue and are unable to reach [place], you may choose to seek medical care at your doctor's office, retail clinic, urgent care center, or emergency room.  If you have a medical emergency, please immediately call 911 or go to the emergency department.  Pager Numbers  - Dr. Nehemiah Massed: [phone]  - Dr. Laurence Ferrari: [phone]  - Dr. Nicole Kindred: [phone]  In the event of inclement weather, please call our main line at [phone] for an update on the status of any delays or closures.  Dermatology Medication Tips: Please keep the boxes that topical medications come in in order to help keep track of the instructions about where and how to use these. Pharmacies typically print the medication instructions only on the boxes and not directly on the medication tubes.   If your medication is too expensive, please contact our office at [phone] option 4 or send [place] a message through [place].   We are unable to tell what your co-pay for medications will be in advance as this is different depending on your insurance coverage. However, we may be able to find a substitute medication  at lower cost or fill out paperwork to get insurance to cover a needed medication.   If a prior authorization is required to get your medication covered by your insurance company, please allow [place] 1-2 business days to complete this process.  Drug prices often vary depending on where the prescription is filled and some pharmacies may offer cheaper prices.  The website www.goodrx.com contains coupons for medications through different pharmacies. The prices here do not account for what the cost may be with help from insurance (it may be cheaper with your insurance), but the website can give you the price if you did not use any insurance.  - You can print the associated coupon and take it with your prescription to the pharmacy.  - You may also stop by our office during regular business hours and pick up a GoodRx coupon card.  - If you need your prescription sent electronically to a different pharmacy, notify our office through [facility] or by phone at [phone] option 4.     Si Usted Necesita Algo Despus de Su Visita  Tambin puede enviarnos un mensaje a travs de Pharmacist, community. Por lo general respondemos a los mensajes de MyChart en el transcurso de 1 a 2 das hbiles.  Para renovar recetas, por favor pida a su farmacia que se ponga en contacto con nuestra oficina. Harland Dingwall de fax es [place] [phone].  Si tiene un asunto urgente cuando la clnica est cerrada y que no puede esperar hasta el siguiente da hbil, puede llamar/localizar a su doctor(a) al nmero que aparece a continuacin.   Por favor, tenga en cuenta que aunque hacemos todo lo posible para estar disponibles para asuntos urgentes fuera del horario de [place], no estamos disponibles las 24 horas del da, los 7 das de la [place].   Si tiene un problema urgente y no puede comunicarse con nosotros, puede optar por buscar atencin mdica  en el consultorio de su doctor(a), en una clnica privada, en un centro de atencin  urgente o en una sala de emergencias.  Si tiene Engineering geologist, por favor llame inmediatamente al 911 o vaya a la sala de emergencias.  Nmeros de bper  - Dr. Nehemiah Massed: [phone]  - Dra. Moye: [phone]  - Dra. Nicole Kindred: [phone]  En caso de inclemencias del [place],  por favor llame a nuestra lnea principal al [phone] para una actualizacin sobre el [place] de cualquier retraso o cierre.  Consejos para la medicacin en dermatologa: Por favor, guarde las cajas en las que vienen los medicamentos de uso tpico para ayudarle a seguir las instrucciones sobre dnde y cmo usarlos. Las farmacias generalmente imprimen las instrucciones del medicamento slo en las cajas y no directamente en los tubos del [place].   Si su medicamento es muy caro, por favor, pngase en contacto con Zigmund Daniel llamando al [phone] y presione la opcin 4 o envenos un mensaje a travs de Pharmacist, community.   No podemos decirle cul ser su copago por los medicamentos por adelantado ya que esto es diferente dependiendo de la cobertura de su seguro. Sin embargo, es posible que podamos encontrar un medicamento sustituto a Electrical engineer un formulario para que el seguro cubra el medicamento que se considera necesario.   Si se requiere una autorizacin previa para que su compaa de seguros [place] su medicamento, por favor permtanos de 1 a 2 das hbiles para completar este proceso.  Los precios de los medicamentos varan con frecuencia dependiendo del Environmental consultant de dnde se surte la receta y alguna farmacias pueden ofrecer precios ms baratos.  El sitio web www.goodrx.com tiene cupones para medicamentos de Airline pilot. Los precios aqu no tienen en cuenta lo que podra costar con la ayuda del seguro (puede ser ms barato con su seguro), pero el sitio web puede darle el precio si no utiliz Research scientist (physical sciences).  - Puede imprimir el cupn correspondiente y llevarlo con su receta a la farmacia.  -  Tambin puede pasar por nuestra oficina durante el horario de atencin regular y Charity fundraiser una tarjeta de cupones de GoodRx.  - Si necesita que su receta se enve electrnicamente a una farmacia diferente, informe a nuestra oficina a travs de MyChart de [place] o por telfono llamando al [phone] y presione la opcin 4.

## 2022-04-17 ENCOUNTER — Ambulatory Visit (INDEPENDENT_AMBULATORY_CARE_PROVIDER_SITE_OTHER): Payer: Medicare Other | Admitting: Pulmonary Disease

## 2022-04-17 ENCOUNTER — Encounter: Payer: Self-pay | Admitting: Pulmonary Disease

## 2022-04-17 VITALS — BP 120/78 | HR 73 | Ht 70.0 in | Wt 137.6 lb

## 2022-04-17 DIAGNOSIS — R599 Enlarged lymph nodes, unspecified: Secondary | ICD-10-CM

## 2022-04-17 DIAGNOSIS — Z853 Personal history of malignant neoplasm of breast: Secondary | ICD-10-CM | POA: Diagnosis not present

## 2022-04-17 DIAGNOSIS — R918 Other nonspecific abnormal finding of lung field: Secondary | ICD-10-CM | POA: Diagnosis not present

## 2022-04-17 NOTE — Progress Notes (Signed)
Synopsis: Referred in January 2024 for abnormal PET scan by Nicholas Lose, MD  Subjective:   PATIENT ID: Julie Rivera GENDER: female DOB: Sep 06, 1945, MRN: [id number]  Chief Complaint  Patient presents with   Consult    Lung nodule    This is a 77 year old female, past medical history of breast cancer initially diagnosed in 2021.  She had a stage Ia, T2, N0, M0, ER/PR positive HER2 negative malignancy. She had a PET scan complete by oncology which revealed hypermetbolic lymph nodes within the bilateral hilum and mediastinal locations.  She also has multiple scattered pulmonary nodules.  Imaging concerning for sarcoidosis.  She has no prior malignancies except for the breast cancer.  No skin cancers no melanoma.    Oncology History  Malignant neoplasm of upper-outer quadrant of left breast in female, estrogen receptor positive ([place])  05/21/2019 Initial Diagnosis   Screening mammogram detected an asymmetry and calcifications 4.5 cm in the left breast. Diagnostic mammogram showed a 1.9cm mass and calcifications in the upper outer left breast.  Biopsy revealed grade 2 IDC ER/PR positive HER-2 negative with a Ki-67 of 5%   07/29/2019 Surgery   Left lumpectomy Barry Dienes) ([id number]): IDC, grade 1, 2.4cm, with low grade DCIS, 2 left axillary lymph nodes negative. Broadly present at the anterior margin of specimen B and focally present at the inferior margin of specimen B. Focally present at the lateral margin of specimen B.   07/29/2019 Cancer Staging   Staging form: Breast, AJCC 8th Edition - Pathologic stage from 07/29/2019: Stage IA (pT2, pN0, cM0, G1, ER+, PR+, HER2-)   08/25/2019 - 09/23/2019 Radiation Therapy   The patient initially received a dose of 40.05 Gy in 15 fractions to the breast using whole-breast tangent fields. This was delivered using a 3-D conformal technique. The pt received a boost delivering an additional 12 Gy in 6 fractions using a electron boost with 73mV  electrons. The total dose was 52.05 Gy.   10/2019 -  Anti-estrogen oral therapy   Anastrozole, discontinued 12/31/19 due to insomnia, hot flashes, and joint pain; switched to tamoxifen 12/31/19     Past Medical History:  Diagnosis Date   Actinic keratosis    Allergy    Basal cell carcinoma 03/22/2019   L nasal tip, MOHs 05/18/2019   Basal cell carcinoma 03/26/2010   L lat upper chest, L chest infer clavicle   Basal cell carcinoma 09/12/2009   L medial pretibial   Basal cell carcinoma 01/03/2020   L alar crease, txted with 5FU   Chicken pox    Kidney stones    Squamous cell carcinoma of skin 04/22/2016   R ant thigh medial, R ant thigh lateral     Family History  Problem Relation Age of Onset   Hypertension Sister    Sudden death Neg Hx    Hyperlipidemia Neg Hx    Heart attack Neg Hx    Diabetes Neg Hx    Ovarian cancer Neg Hx    Breast cancer Neg Hx      Past Surgical History:  Procedure Laterality Date   BACK SURGERY  2017   BREAST LUMPECTOMY WITH RADIOACTIVE SEED AND SENTINEL LYMPH NODE BIOPSY Left 07/29/2019   Procedure: LEFT BREAST LUMPECTOMY WITH RADIOACTIVE SEED AND SENTINEL LYMPH NODE BIOPSY;  Surgeon: BStark Klein MD;  [facility] [facility]  Service: General;  Laterality: Left;  PEC BLOCK   KNEE ARTHROCENTESIS     RADIOACTIVE SEED GUIDED EXCISIONAL BREAST BIOPSY Right 07/29/2019  Procedure: RIGHT RADIOACTIVE SEED GUIDED EXCISIONAL BREAST BIOPSY;  Surgeon: Stark Klein, MD;  Location: [facility];  Service: General;  Laterality: Right;  PEC BLOCK   SHOULDER SURGERY     TONSILLECTOMY      Social History   Socioeconomic History   Marital status: Married    Spouse name: Not on file   Number of children: Not on file   Years of education: Not on file   Highest education level: Not on file  Occupational History   Not on file  Tobacco Use   Smoking status: Never   Smokeless tobacco: Never  Vaping Use   Vaping Use: Never used  Substance and Sexual Activity   Alcohol  use: No   Drug use: No   Sexual activity: Not on file  Other Topics Concern   Not on file  Social History Narrative   Production assistant, radio, retired      2 children      4 grandchildren            Social Determinants of Health   Financial Resource Strain: Low Risk  (12/28/2020)   Overall Financial Resource Strain (CARDIA)    Difficulty of Paying Living Expenses: Not hard at all  Food Insecurity: No [place] (12/28/2020)   Hunger Vital Sign    Worried About Running Out of Food in the Last Year: Never true    [place] in the Last Year: Never true  Transportation Needs: No Transportation Needs (12/28/2020)   PRAPARE - Hydrologist (Medical): No    Lack of Transportation (Non-Medical): No  Physical Activity: Sufficiently Active (12/28/2020)   Exercise Vital Sign    Days of Exercise per Week: 7 days    Minutes of Exercise per Session: 60 min  Stress: No Stress Concern Present (12/28/2020)   [place]    Feeling of Stress : Not at all  Social Connections: Unknown (12/28/2020)   Social Connection and Isolation Panel [NHANES]    Frequency of Communication with Friends and Family: Not on file    Frequency of Social Gatherings with Friends and Family: Not on file    Attends Religious Services: Not on file    Active Member of Clubs or Organizations: Not on file    Attends Archivist Meetings: Not on file    Marital Status: Married  Intimate Partner Violence: Not At Risk (12/28/2020)   Humiliation, Afraid, Rape, and Kick questionnaire    Fear of Current or Ex-Partner: No    Emotionally Abused: No    Physically Abused: No    Sexually Abused: No     Allergies  Allergen Reactions   Wasp Venom Anaphylaxis    Throat closed up.    Gabapentin     Unusual dreams     Outpatient Medications Prior to Visit  Medication Sig Dispense Refill   conjugated  estrogens-medroxyprogesteron (PREMPHASE) TABS tablet Take 1 tablet by mouth daily. (Patient not taking: Reported on 04/17/2022)     EPINEPHrine 0.3 mg/0.3 mL IJ SOAJ injection Inject 0.3 mg into the muscle as needed for anaphylaxis. 2 each 1   gatifloxacin (ZYMAXID) 0.5 % SOLN SMARTSIG:In Eye(s) (Patient not taking: Reported on 04/17/2022)     minoxidil (LONITEN) 2.5 MG tablet Take 1 tablet (2.5 mg total) by mouth daily. 30 tablet 2   tobramycin-dexamethasone (TOBRADEX) ophthalmic solution SMARTSIG:In Eye(s) (Patient not taking: Reported on 12/17/2021)  TYRVAYA 0.03 MG/ACT SOLN Place into both nostrils. (Patient not taking: Reported on 12/17/2021)     No facility-administered medications prior to visit.    Review of Systems  Constitutional:  Negative for chills, fever, malaise/fatigue and weight loss.  HENT:  Negative for hearing loss, sore throat and tinnitus.   Eyes:  Negative for blurred vision and double vision.  Respiratory:  Negative for cough, hemoptysis, sputum production, shortness of breath, wheezing and stridor.   Cardiovascular:  Negative for chest pain, palpitations, orthopnea, leg swelling and PND.  Gastrointestinal:  Negative for abdominal pain, constipation, diarrhea, heartburn, nausea and vomiting.  Genitourinary:  Negative for dysuria, hematuria and urgency.  Musculoskeletal:  Negative for joint pain and myalgias.  Skin:  Negative for itching and rash.  Neurological:  Negative for dizziness, tingling, weakness and headaches.  Endo/Heme/Allergies:  Negative for environmental allergies. Does not bruise/bleed easily.  Psychiatric/Behavioral:  Negative for depression. The patient is not nervous/anxious and does not have insomnia.   All other systems reviewed and are negative.    Objective:  Physical Exam Vitals reviewed.  Constitutional:      General: She is not in acute distress.    Appearance: She is well-developed.  HENT:     Head: Normocephalic and atraumatic.   Eyes:     General: No scleral icterus.    Conjunctiva/sclera: Conjunctivae normal.     Pupils: Pupils are equal, round, and reactive to light.  Neck:     Vascular: No JVD.     Trachea: No tracheal deviation.  Cardiovascular:     Rate and Rhythm: Normal rate and regular rhythm.     Heart sounds: Normal heart sounds. No murmur heard. Pulmonary:     Effort: Pulmonary effort is normal. No tachypnea, accessory muscle usage or respiratory distress.     Breath sounds: No stridor. No wheezing, rhonchi or rales.  Abdominal:     General: There is no distension.     Palpations: Abdomen is soft.     Tenderness: There is no abdominal tenderness.  Musculoskeletal:        General: No tenderness.     Cervical back: Neck supple.  Lymphadenopathy:     Cervical: No cervical adenopathy.  Skin:    General: Skin is warm and dry.     Capillary Refill: Capillary refill takes less than 2 seconds.     Findings: No rash.  Neurological:     Mental Status: She is alert and oriented to person, place, and time.  Psychiatric:        Behavior: Behavior normal.      Vitals:   04/17/22 1009  BP: 120/78  Pulse: 73  SpO2: 95%  Weight: 137 lb 10.1 oz (62.4 kg)  Height: '5\' 10"'$  (1.778 m)   95% on RA BMI Readings from Last 3 Encounters:  04/17/22 19.75 kg/m  12/28/20 20.38 kg/m  05/15/20 20.50 kg/m   Wt Readings from Last 3 Encounters:  04/17/22 137 lb 10.1 oz (62.4 kg)  12/28/20 138 lb (62.6 kg)  05/15/20 138 lb 12.8 oz (63 kg)     CBC    Component Value Date/Time   WBC 4.1 07/26/2019 1144   RBC 4.72 07/26/2019 1144   HGB 14.2 07/26/2019 1144   HGB 13.7 05/26/2019 0835   HCT 43.6 07/26/2019 1144   PLT 256 07/26/2019 1144   PLT 236 05/26/2019 0835   MCV 92.4 07/26/2019 1144   MCH 30.1 07/26/2019 1144   MCHC 32.6 07/26/2019 1144  RDW 12.2 07/26/2019 1144   LYMPHSABS 1.0 05/26/2019 0835   MONOABS 0.4 05/26/2019 0835   EOSABS 0.1 05/26/2019 0835   BASOSABS 0.0 05/26/2019 0835      Chest Imaging: Nuclear medicine pet imaging: Bilateral pulmonary nodules with hypermetabolic mediastinal and hilar adenopathy concerning for possible underlying diagnosis of sarcoid.  No biopsies, no family history. The patient's images have been independently reviewed by me.    Pulmonary Functions Testing Results:     No data to display          FeNO:   Pathology:   Echocardiogram:   Heart Catheterization:     Assessment & Plan:     ICD-10-CM   1. Adenopathy  R59.9 CT Chest Wo Contrast    2. History of breast cancer  Z85.3     3. Multiple pulmonary nodules  R91.8       Discussion:  This is a 77 year old female, past medical history of stage I breast cancer.  She had a PET scan for evaluation and follow-up with oncology.  This revealed hypermetabolic lymph nodes within the mediastinum and hilum concerning for possible underlying diagnosis of sarcoid.  She has had no other malignancies in the past.  No family history of sarcoidosis.  She has no ongoing respiratory symptoms at this time this was found incidentally.  Plan: Today in the office we talked about the risk benefits and alternatives of consideration for bronchoscopy for tissue sampling of the lymph nodes as well as the underlying diagnostic yield if we were related to sarcoidosis.  We also talked about the referrals to thoracic surgery for consideration of mediastinoscopy and biopsy of the lymph nodes in the setting of sarcoidosis. We also talked about a more conservative approach considering watchful waiting with repeat CT imaging in 3 to 6 months. Since the patient is asymptomatic and this was found incidentally we opted for a 41-monthrepeat noncontrasted CT scan follow-up. If the lymph nodes enlarge over time or there is any progression or change for pulmonary nodules then I think we can consider bronchoscopy and biopsy. Patient however was leaning towards a more definitive option for diagnosis with a  referral to thoracic surgery for mediastinoscopy.  We appreciate the referral   Current Outpatient Medications:    conjugated estrogens-medroxyprogesteron (PREMPHASE) TABS tablet, Take 1 tablet by mouth daily. (Patient not taking: Reported on 04/17/2022), Disp: , Rfl:    EPINEPHrine 0.3 mg/0.3 mL IJ SOAJ injection, Inject 0.3 mg into the muscle as needed for anaphylaxis., Disp: 2 each, Rfl: 1   gatifloxacin (ZYMAXID) 0.5 % SOLN, SMARTSIG:In Eye(s) (Patient not taking: Reported on 04/17/2022), Disp: , Rfl:    minoxidil (LONITEN) 2.5 MG tablet, Take 1 tablet (2.5 mg total) by mouth daily., Disp: 30 tablet, Rfl: 2   tobramycin-dexamethasone (TOBRADEX) ophthalmic solution, SMARTSIG:In Eye(s) (Patient not taking: Reported on 12/17/2021), Disp: , Rfl:    TYRVAYA 0.03 MG/ACT SOLN, Place into both nostrils. (Patient not taking: Reported on 12/17/2021), Disp: , Rfl:   I spent 62 minutes dedicated to the care of this patient on the date of this encounter to include pre-visit review of records, face-to-face time with the patient discussing conditions above, post visit ordering of testing, clinical documentation with the electronic health record, making appropriate referrals as documented, and communicating necessary findings to members of the patients care team.    BGarner Nash DO [facility] 04/17/2022 10:45 AM

## 2022-04-17 NOTE — Patient Instructions (Signed)
Thank you for visiting Dr. Valeta Harms at [facility]. Today we recommend the following:  Orders Placed This Encounter  Procedures   CT Chest Wo Contrast   Return in about 3 months (around 07/17/2022) for with Eric Form, NP, or Dr. Valeta Harms.    Please do your part to reduce the spread of COVID-19.

## 2022-04-22 ENCOUNTER — Other Ambulatory Visit: Payer: Self-pay

## 2022-04-22 ENCOUNTER — Other Ambulatory Visit: Payer: Self-pay | Admitting: Family

## 2022-04-22 MED ORDER — ZORYVE 0.3 % EX CREA: TOPICAL_CREAM | CUTANEOUS | 1 refills | Status: AC

## 2022-04-22 NOTE — Telephone Encounter (Signed)
Patient called requesting a refill of Zoryve cream, [facility]. Ok [facility]

## 2022-04-24 ENCOUNTER — Telehealth: Payer: Self-pay

## 2022-04-24 NOTE — Telephone Encounter (Signed)
Zoryve Cream not covered by insurance. Patient also has medicare and would not receive medication at reasonable price.

## 2022-04-24 NOTE — Telephone Encounter (Signed)
Patient advised we will leave sample of Zoryve for her at front desk. Lot # [id number]  Exp: 12/24 Lurlean Horns., RMA

## 2022-04-24 NOTE — Telephone Encounter (Signed)
Left message for pt to return call. aw

## 2022-05-14 ENCOUNTER — Telehealth: Payer: Self-pay | Admitting: Pulmonary Disease

## 2022-05-15 NOTE — Telephone Encounter (Signed)
Patient is now wanting to move her ct from 3 months out to 6 months is this ok

## 2022-05-15 NOTE — Telephone Encounter (Signed)
Dr Valeta Harms,  Patient is wanting to move her CT scan that was due in 3 months out to 6 months out now.   Are you ok with this sir  Please advise

## 2022-05-20 NOTE — Telephone Encounter (Signed)
Tivis Ringer, April L, RN; [facility] Triage Pool42 minutes ago (12:47 PM)    Yes ok to move out to 6 months from previous ct chest  Thanks  BLI    Attempted to call pt to let her know the info per Dr. Valeta Harms but unable to reach but did leave pt a detailed message of the info from him.  Routing to [employer] as an Pharmacist, hospital.

## 2022-05-21 NOTE — Telephone Encounter (Signed)
Ct cancelled and order printed and placed in May for June

## 2022-07-11 ENCOUNTER — Other Ambulatory Visit: Payer: Medicare Other

## 2022-07-23 DIAGNOSIS — H04123 Dry eye syndrome of bilateral lacrimal glands: Secondary | ICD-10-CM | POA: Diagnosis not present

## 2022-07-23 DIAGNOSIS — H1045 Other chronic allergic conjunctivitis: Secondary | ICD-10-CM | POA: Diagnosis not present

## 2022-07-23 DIAGNOSIS — H01002 Unspecified blepharitis right lower eyelid: Secondary | ICD-10-CM | POA: Diagnosis not present

## 2022-07-23 DIAGNOSIS — H26491 Other secondary cataract, right eye: Secondary | ICD-10-CM | POA: Diagnosis not present

## 2022-07-23 DIAGNOSIS — H01005 Unspecified blepharitis left lower eyelid: Secondary | ICD-10-CM | POA: Diagnosis not present

## 2022-07-26 ENCOUNTER — Other Ambulatory Visit: Payer: Self-pay | Admitting: Dermatology

## 2022-07-26 DIAGNOSIS — L649 Androgenic alopecia, unspecified: Secondary | ICD-10-CM

## 2022-08-05 ENCOUNTER — Encounter: Payer: Self-pay | Admitting: Dermatology

## 2022-08-05 ENCOUNTER — Ambulatory Visit (INDEPENDENT_AMBULATORY_CARE_PROVIDER_SITE_OTHER): Payer: Medicare Other | Admitting: Dermatology

## 2022-08-05 VITALS — BP 121/76

## 2022-08-05 DIAGNOSIS — Z7189 Other specified counseling: Secondary | ICD-10-CM | POA: Diagnosis not present

## 2022-08-05 DIAGNOSIS — L300 Nummular dermatitis: Secondary | ICD-10-CM

## 2022-08-05 DIAGNOSIS — L814 Other melanin hyperpigmentation: Secondary | ICD-10-CM

## 2022-08-05 DIAGNOSIS — L988 Other specified disorders of the skin and subcutaneous tissue: Secondary | ICD-10-CM | POA: Diagnosis not present

## 2022-08-05 DIAGNOSIS — L649 Androgenic alopecia, unspecified: Secondary | ICD-10-CM

## 2022-08-05 DIAGNOSIS — D485 Neoplasm of uncertain behavior of skin: Secondary | ICD-10-CM

## 2022-08-05 DIAGNOSIS — Z85828 Personal history of other malignant neoplasm of skin: Secondary | ICD-10-CM | POA: Diagnosis not present

## 2022-08-05 DIAGNOSIS — L570 Actinic keratosis: Secondary | ICD-10-CM | POA: Diagnosis not present

## 2022-08-05 MED ORDER — MINOXIDIL 2.5 MG PO TABS: 2.5000 mg | ORAL_TABLET | Freq: Every day | ORAL | 3 refills | Status: DC

## 2022-08-05 MED ORDER — CLOBETASOL PROPIONATE 0.05 % EX SOLN: 1.0000 | CUTANEOUS | 0 refills | Status: AC

## 2022-08-05 NOTE — Patient Instructions (Addendum)
BBL to arms $350 per treatment session BBL to hands only $250 per treatment session BBL to arms and hands $500 per treatment session  Botox recommend 20 units to frown complex $260 for 20 units   Eczema Skin Care  Buy TWO 16oz jars of CeraVe moisturizing cream  [facility], [facility], [facility] (no prescription needed)  Costs about $15 per jar   Jar #1: Use as a moisturizer as needed. Can be applied to any area of the body. Use twice daily to unaffected areas.  Jar #2: Pour one 50ml bottle of clobetasol 0.05% solution into jar, mix well. Label this jar to indicate the medication has been added. Use twice daily to affected areas. Do not apply to face, groin or underarms.  Moisturizer may burn or sting initially. Try for at least 4 weeks.     Due to recent changes in healthcare laws, you may see results of your pathology and/or laboratory studies on MyChart before the doctors have had a chance to review them. We understand that in some cases there may be results that are confusing or concerning to you. Please understand that not all results are received at the same time and often the doctors may need to interpret multiple results in order to provide you with the best plan of care or course of treatment. Therefore, we ask that you please give [place] 2 business days to thoroughly review all your results before contacting the office for clarification. Should we see a critical lab result, you will be contacted sooner.   If You Need Anything After Your Visit  If you have any questions or concerns for your doctor, please call our main line at [phone] and press option 4 to reach your doctor's medical assistant. If no one answers, please leave a voicemail as directed and we will return your call as soon as possible. Messages left after 4 pm will be answered the following business day.   You may also send [place] a message via MyChart. We typically respond to MyChart messages within 1-2 business days.  For  prescription refills, please ask your pharmacy to contact our office. Our fax number is [phone].  If you have an urgent issue when the clinic is closed that cannot wait until the next business day, you can page your doctor at the number below.    Please note that while we do our best to be available for urgent issues outside of office hours, we are not available 24/7.   If you have an urgent issue and are unable to reach [place], you may choose to seek medical care at your doctor's office, retail clinic, urgent care center, or emergency room.  If you have a medical emergency, please immediately call 911 or go to the emergency department.  Pager Numbers  - Dr. Gwen Pounds: [phone]  - Dr. Neale Burly: [phone]  - Dr. Roseanne Reno: [phone]  In the event of inclement weather, please call our main line at [phone] for an update on the status of any delays or closures.  Dermatology Medication Tips: Please keep the boxes that topical medications come in in order to help keep track of the instructions about where and how to use these. Pharmacies typically print the medication instructions only on the boxes and not directly on the medication tubes.   If your medication is too expensive, please contact our office at [phone] option 4 or send [place] a message through MyChart.   We are unable to tell what your co-pay for medications will  be in advance as this is different depending on your insurance coverage. However, we may be able to find a substitute medication at lower cost or fill out paperwork to get insurance to cover a needed medication.   If a prior authorization is required to get your medication covered by your insurance company, please allow [place] 1-2 business days to complete this process.  Drug prices often vary depending on where the prescription is filled and some pharmacies may offer cheaper prices.  The website www.goodrx.com contains coupons for medications through different  pharmacies. The prices here do not account for what the cost may be with help from insurance (it may be cheaper with your insurance), but the website can give you the price if you did not use any insurance.  - You can print the associated coupon and take it with your prescription to the pharmacy.  - You may also stop by our office during regular business hours and pick up a GoodRx coupon card.  - If you need your prescription sent electronically to a different pharmacy, notify our office through [facility] or by phone at [phone] option 4.     Si Usted Necesita Algo Despus de Su Visita  Tambin puede enviarnos un mensaje a travs de Clinical cytogeneticist. Por lo general respondemos a los mensajes de MyChart en el transcurso de 1 a 2 das hbiles.  Para renovar recetas, por favor pida a su farmacia que se ponga en contacto con nuestra oficina. Annie Sable de fax es [place] [phone].  Si tiene un asunto urgente cuando la clnica est cerrada y que no puede esperar hasta el siguiente da hbil, puede llamar/localizar a su doctor(a) al nmero que aparece a continuacin.   Por favor, tenga en cuenta que aunque hacemos todo lo posible para estar disponibles para asuntos urgentes fuera del horario de [place], no estamos disponibles las 24 horas del da, los 7 [address]  [address] de la [employer].   Si tiene un problema urgente y no puede comunicarse con nosotros, puede optar por buscar atencin mdica  en el consultorio de su doctor(a), en una clnica privada, en un centro de atencin urgente o en una sala de emergencias.  Si tiene Engineer, drilling, por favor llame inmediatamente al 911 o vaya a la sala de emergencias.  Nmeros de bper  - Dr. Gwen Pounds: [phone]  - Dra. Moye: [phone]  - Dra. Roseanne Reno: [phone]  En caso de inclemencias del [place], por favor llame a Lacy Duverney principal al [phone] para una actualizacin sobre el [place] de cualquier retraso o cierre.  Consejos para la  medicacin en dermatologa: Por favor, guarde las cajas en las que vienen los medicamentos de uso tpico para ayudarle a seguir las instrucciones sobre dnde y cmo usarlos. Las farmacias generalmente imprimen las instrucciones del medicamento slo en las cajas y no directamente en los tubos del [place].   Si su medicamento es muy caro, por favor, pngase en contacto con Rolm Gala llamando al [phone] y presione la opcin 4 o envenos un mensaje a travs de Clinical cytogeneticist.   No podemos decirle cul ser su copago por los medicamentos por adelantado ya que esto es diferente dependiendo de la cobertura de su seguro. Sin embargo, es posible que podamos encontrar un medicamento sustituto a Audiological scientist un formulario para que el seguro cubra el medicamento que se considera necesario.   Si se requiere una autorizacin previa para que su compaa de seguros [place] su medicamento, por favor permtanos de 1  a 2 das hbiles para completar este proceso.  Los precios de los medicamentos varan con frecuencia dependiendo del Environmental consultant de dnde se surte la receta y alguna farmacias pueden ofrecer precios ms baratos.  El sitio web www.goodrx.com tiene cupones para medicamentos de Airline pilot. Los precios aqu no tienen en cuenta lo que podra costar con la ayuda del seguro (puede ser ms barato con su seguro), pero el sitio web puede darle el precio si no utiliz Research scientist (physical sciences).  - Puede imprimir el cupn correspondiente y llevarlo con su receta a la farmacia.  - Tambin puede pasar por nuestra oficina durante el horario de atencin regular y Charity fundraiser una tarjeta de cupones de GoodRx.  - Si necesita que su receta se enve electrnicamente a una farmacia diferente, informe a nuestra oficina a travs de MyChart de [place] o por telfono llamando al [phone] y presione la opcin 4.

## 2022-08-05 NOTE — Progress Notes (Signed)
Follow-Up Visit   Subjective  Julie Rivera is a 77 y.o. female who presents for the following: Alopecia f/u, Minoxidil 2.5mg  po qd for hair loss scalp- has improved and no side effects, recheck BCC L alar crease, EDC 04/09/22, Pt interested in Botox to frown complex and would like to discuss BBL to arms/hands.  Also has itching on back and would like a rf of Clob/CeraVe mix, which helped in the past.   The following portions of the chart were reviewed this encounter and updated as appropriate: medications, allergies, medical history  Review of Systems:  No other skin or systemic complaints except as noted in HPI or Assessment and Plan.  Objective  Well appearing patient in no apparent distress; mood and affect are within normal limits.   A focused examination was performed of the following areas: Face, scalp  Relevant exam findings are noted in the Assessment and Plan.  upper lip x 3 (3) Pink/brown scaly macules             Assessment & Plan   ANDROGENETIC ALOPECIA (FEMALE PATTERN HAIR LOSS) Exam: Diffuse thinning of the crown and widening of the midline part with retention of the frontal hairline.  Some regrowth noted at BL temporal hairline when compared to baseline photos.  Chronic and persistent condition with duration or expected duration over one year. Condition is symptomatic/ bothersome to patient. Improving but not currently at goal.   Female Androgenic Alopecia is a chronic condition related to genetics and/or hormonal changes.  In women androgenetic alopecia is commonly associated with menopause but may occur any time after puberty.  It causes hair thinning primarily on the crown with widening of the part and temporal hairline recession.  Can use OTC Rogaine (minoxidil) 5% solution/foam as directed.  Oral treatments in female patients who have no contraindication may include : - Low dose oral minoxidil 1.25 - 5mg  daily - Spironolactone 50 - 100mg  bid -  Finasteride 2.5 - 5 mg daily Adjunctive therapies include: - Low Level Laser Light Therapy (LLLT) - Platelet-rich plasma injections (PRP) - Hair Transplants or scalp reduction   Treatment Plan: Some improvement compared to photos Cont Minoxidil 2.5mg  1 po qd    Doses of minoxidil for hair loss are considered 'low dose'. This is because the doses used for hair loss are much lower than the doses which are used for conditions such as high blood pressure (hypertension). The doses used for hypertension are 10-40mg  per day.  Side effects are uncommon at the low doses (up to 2.5 mg/day) used to treat hair loss. Potential side effects, more commonly seen at higher doses, include: Increase in hair growth (hypertrichosis) elsewhere on face and body Temporary hair shedding upon starting medication which may last up to 4 weeks Ankle swelling, fluid retention, rapid weight gain more than 5 pounds Low blood pressure and feeling lightheaded or dizzy when standing up quickly Fast or irregular heartbeat Headaches    AK (actinic keratosis) (3) upper lip x 3  Vs ISK  Symptomatic, irritating, patient would like treated.   Destruction of lesion - upper lip x 3  Destruction method: cryotherapy   Informed consent: discussed and consent obtained   Lesion destroyed using liquid nitrogen: Yes   Region frozen until ice ball extended beyond lesion: Yes   Outcome: patient tolerated procedure well with no complications   Post-procedure details: wound care instructions given   Additional details:  Prior to procedure, discussed risks of blister formation, small wound,  skin dyspigmentation, or rare scar following cryotherapy. Recommend Vaseline ointment to treated areas while healing.     HISTORY OF BASAL CELL CARCINOMA OF THE SKIN - No evidence of recurrence today- L alar crease - Recommend regular full body skin exams - Recommend daily broad spectrum sunscreen SPF 30+ to sun-exposed areas, reapply every 2  hours as needed.  - Call if any new or changing lesions are noted between office visits    LENTIGINES Exam: scattered tan macules Due to sun exposure Treatment Plan: Benign-appearing, observe. Recommend daily broad spectrum sunscreen SPF 30+ to sun-exposed areas, reapply every 2 hours as needed.  Call for any changes  - arms, dorsum hands, -  Discussed pt using her Tretinoin on arms qhs and continuing the Amlactin qhs, Start SPF qam  Counseling for BBL / IPL / Laser and Coordination of Care Discussed the treatment option of Broad Band Light (BBL) /Intense Pulsed Light (IPL)/ Laser for skin discoloration, including brown spots and redness.  Typically we recommend at least 1-3 treatment sessions about 5-8 weeks apart for best results.  Cannot have tanned skin when BBL performed, and regular use of sunscreen is advised after the procedure to help maintain results. The patient's condition may also require "maintenance treatments" in the future.  The fee for BBL / laser treatments is $350 per treatment session for the whole face.  A fee can be quoted for other parts of the body.  Insurance typically does not pay for BBL/laser treatments and therefore the fee is an out-of-pocket cost.  $500/treatment for arms and hands  FACIAL ELASTOSIS Exam: Rhytides and volume loss frown complex, baseline photos taken today.  Treatment Plan: Discussed Botox 20 units to frown complex  Recommend daily broad spectrum sunscreen SPF 30+ to sun-exposed areas, reapply every 2 hours as needed. Call for new or changing lesions.  Staying in the shade or wearing long sleeves, sun glasses (UVA+UVB protection) and wide brim hats (4-inch brim around the entire circumference of the hat) are also recommended for sun protection.     Location: Frown complex Botox 20 units injected today to:  Informed consent: Discussed risks (infection, pain, bleeding, bruising, swelling, allergic reaction, paralysis of nearby muscles,  eyelid droop, double vision, neck weakness, difficulty breathing, headache, undesirable cosmetic result, and need for additional treatment) and benefits of the procedure, as well as the alternatives.  Informed consent was obtained.  Preparation: The area was cleansed with alcohol.  Procedure Details:  Botox was injected into the dermis with a 30-gauge needle. Pressure applied to any bleeding. Ice packs offered for swelling.  Lot Number:  [id number] Expiration:  07/2024  Total Units Injected:  20  Plan: Tylenol may be used for headache.  Allow 2 weeks before returning to clinic for additional dosing as needed. Patient will call for any problems.    NEOPLASM Exam: 6.89mm pink brown slightly pearly pap, mid frontal scalp at crown  Treatment Plan: Suspicious for BCC. Plan bx on f/u.  Pt prefers to time it with her next Botox injections in 3-4 mos  NUMMULAR DERMATITIS  Exam: Pink excoriated paps back, xerosis back.  Pt c/o itching.  Chronic and persistent condition with duration or expected duration over one year. Condition is symptomatic/ bothersome to patient. Not currently at goal.   Treatment Plan: Start Clobetasol/Cerave mix qd/bid aa back prn itching, avoid face, groin, axilla  Topical steroids (such as triamcinolone, fluocinolone, fluocinonide, mometasone, clobetasol, halobetasol, betamethasone, hydrocortisone) can cause thinning and lightening of the skin if  they are used for too long in the same area. Your physician has selected the right strength medicine for your problem and area affected on the body. Please use your medication only as directed by your physician to prevent side effects.     Return for 3-8m Botox and biopsy scalp, recheck nose.  I, Ardis Rowan, RMA, am acting as scribe for [name], MD .   Documentation: I have reviewed the above documentation for accuracy and completeness, and I agree with the above.  [name], MD

## 2022-10-17 DIAGNOSIS — Z4689 Encounter for fitting and adjustment of other specified devices: Secondary | ICD-10-CM | POA: Diagnosis not present

## 2022-10-17 DIAGNOSIS — Z1231 Encounter for screening mammogram for malignant neoplasm of breast: Secondary | ICD-10-CM | POA: Diagnosis not present

## 2022-10-17 DIAGNOSIS — N8111 Cystocele, midline: Secondary | ICD-10-CM | POA: Diagnosis not present

## 2022-10-17 DIAGNOSIS — N812 Incomplete uterovaginal prolapse: Secondary | ICD-10-CM | POA: Diagnosis not present

## 2022-10-18 ENCOUNTER — Encounter: Payer: Self-pay | Admitting: Hematology and Oncology

## 2022-11-01 ENCOUNTER — Ambulatory Visit: Payer: Medicare Other

## 2022-11-08 ENCOUNTER — Ambulatory Visit: Payer: Medicare Other | Admitting: Acute Care

## 2022-11-18 DIAGNOSIS — M79671 Pain in right foot: Secondary | ICD-10-CM | POA: Diagnosis not present

## 2022-11-18 DIAGNOSIS — M19071 Primary osteoarthritis, right ankle and foot: Secondary | ICD-10-CM | POA: Diagnosis not present

## 2022-12-17 ENCOUNTER — Other Ambulatory Visit: Payer: Self-pay | Admitting: Dermatology

## 2022-12-23 ENCOUNTER — Ambulatory Visit: Payer: Medicare Other | Admitting: Dermatology

## 2023-01-01 ENCOUNTER — Ambulatory Visit (INDEPENDENT_AMBULATORY_CARE_PROVIDER_SITE_OTHER): Payer: Medicare Other | Admitting: Dermatology

## 2023-01-01 VITALS — BP 134/77

## 2023-01-01 DIAGNOSIS — L720 Epidermal cyst: Secondary | ICD-10-CM | POA: Diagnosis not present

## 2023-01-01 DIAGNOSIS — L820 Inflamed seborrheic keratosis: Secondary | ICD-10-CM

## 2023-01-01 DIAGNOSIS — W908XXA Exposure to other nonionizing radiation, initial encounter: Secondary | ICD-10-CM

## 2023-01-01 DIAGNOSIS — C4441 Basal cell carcinoma of skin of scalp and neck: Secondary | ICD-10-CM

## 2023-01-01 DIAGNOSIS — Z85828 Personal history of other malignant neoplasm of skin: Secondary | ICD-10-CM

## 2023-01-01 DIAGNOSIS — L988 Other specified disorders of the skin and subcutaneous tissue: Secondary | ICD-10-CM

## 2023-01-01 DIAGNOSIS — D492 Neoplasm of unspecified behavior of bone, soft tissue, and skin: Secondary | ICD-10-CM

## 2023-01-01 DIAGNOSIS — L578 Other skin changes due to chronic exposure to nonionizing radiation: Secondary | ICD-10-CM | POA: Diagnosis not present

## 2023-01-01 NOTE — Progress Notes (Signed)
Follow-Up Visit   Subjective  Julie Rivera is a 77 y.o. female who presents for the following: BCC vs other, mid frontal scalp at crown, pt presents for bx, Facial Elastosis, face, pt presents for botox,  The patient has spots, moles and lesions to be evaluated, some may be new or changing and the patient may have concern these could be cancer.   The following portions of the chart were reviewed this encounter and updated as appropriate: medications, allergies, medical history  Review of Systems:  No other skin or systemic complaints except as noted in HPI or Assessment and Plan.  Objective  Well appearing patient in no apparent distress; mood and affect are within normal limits.   A focused examination was performed of the following areas: Face, scalp  Relevant exam findings are noted in the Assessment and Plan.  mid frontal scalp at crown 7.0 x 5.76mm pink grey/brown macule        Vertex scalp x 1 Stuck on waxy papule with erythema  L alar crease x 1, R paranasal x 2, L medial cheek x 1 (4) Tiny firm white paps   Botox injection map   Assessment & Plan     Neoplasm of skin mid frontal scalp at crown  Skin / nail biopsy Type of biopsy: tangential   Informed consent: discussed and consent obtained   Anesthesia: the lesion was anesthetized in a standard fashion   Anesthesia comment:  Area prepped with alcohol Anesthetic:  1% lidocaine w/ epinephrine 1-100,000 buffered w/ 8.4% NaHCO3 Instrument used: flexible razor blade   Hemostasis achieved with: pressure, aluminum chloride and electrodesiccation   Outcome: patient tolerated procedure well   Post-procedure details: wound care instructions given   Post-procedure details comment:  Ointment and small bandage applied  Specimen 1 - Surgical pathology Differential Diagnosis: D48.5 ISK vs Pigmented BCC  Check Margins: No 7.0 x 5.75mm pink grey/brown macule  Discussed EDC vs excision if BCC  Inflamed  seborrheic keratosis Vertex scalp x 1  Recheck on f/up  Destruction of lesion - Vertex scalp x 1  Destruction method: cryotherapy   Informed consent: discussed and consent obtained   Lesion destroyed using liquid nitrogen: Yes   Region frozen until ice ball extended beyond lesion: Yes   Outcome: patient tolerated procedure well with no complications   Post-procedure details: wound care instructions given   Additional details:  Prior to procedure, discussed risks of blister formation, small wound, skin dyspigmentation, or rare scar following cryotherapy. Recommend Vaseline ointment to treated areas while healing.   Milia (4) L alar crease x 1, R paranasal x 2, L medial cheek x 1  Symptomatic, irritating, patient would like treated.   Acne/Milia surgery - L alar crease x 1, R paranasal x 2, L medial cheek x 1 (4) Procedure risks and benefits were discussed with the patient and verbal consent was obtained. Following prep of the skin on the L alar crease, R paranasal, L medial cheek with an alcohol swab, and local anesthesia injection, extraction of milia was performed with a comedone extractor following superficial incision made over their surfaces with a #15 surgical blade. Capillary hemostasis was achieved with 20% aluminum chloride solution. Vaseline ointment was applied to each site. The patient tolerated the procedure well.   FACIAL ELASTOSIS face Exam: Rhytides and volume loss.  Treatment Plan: Botox 20 units injected today to:  - Frown complex 20 units  Location: frown complex  Informed consent: Discussed risks (infection, pain, bleeding,  bruising, swelling, allergic reaction, paralysis of nearby muscles, eyelid droop, double vision, neck weakness, difficulty breathing, headache, undesirable cosmetic result, and need for additional treatment) and benefits of the procedure, as well as the alternatives.  Informed consent was obtained.  Preparation: The area was cleansed with  alcohol.  Procedure Details:  Botox was injected into the dermis with a 30-gauge needle. Pressure applied to any bleeding. Ice packs offered for swelling.  Lot Number:  [id number] Expiration:  12/2024  Total Units Injected:  20  Plan: Tylenol may be used for headache.  Allow 2 weeks before returning to clinic for additional dosing as needed. Patient will call for any problems.   Recommend daily broad spectrum sunscreen SPF 30+ to sun-exposed areas, reapply every 2 hours as needed. Call for new or changing lesions.  Staying in the shade or wearing long sleeves, sun glasses (UVA+UVB protection) and wide brim hats (4-inch brim around the entire circumference of the hat) are also recommended for sun protection.    HISTORY OF BASAL CELL CARCINOMA OF THE SKIN - No evidence of recurrence today nose - Recommend regular full body skin exams - Recommend daily broad spectrum sunscreen SPF 30+ to sun-exposed areas, reapply every 2 hours as needed.  - Call if any new or changing lesions are noted between office visits  - L nasal tip clear  ACTINIC DAMAGE - chronic, secondary to cumulative UV radiation exposure/sun exposure over time - diffuse scaly erythematous macules with underlying dyspigmentation - Recommend daily broad spectrum sunscreen SPF 30+ to sun-exposed areas, reapply every 2 hours as needed.  - Recommend staying in the shade or wearing long sleeves, sun glasses (UVA+UVB protection) and wide brim hats (4-inch brim around the entire circumference of the hat). - Call for new or changing lesions.   Return in about 3 months (around 04/02/2023) for 3-64m Botox, 27m , TBSE, Hx of BCC, Hx of SCC, Hx of AKs.  I, Ardis Rowan, RMA, am acting as scribe for [name], MD .   Documentation: I have reviewed the above documentation for accuracy and completeness, and I agree with the above.  [name], MD

## 2023-01-01 NOTE — Patient Instructions (Addendum)
Wound Care Instructions  Cleanse wound gently with soap and water once a day then pat dry with clean gauze. Apply a thin coat of Petrolatum (petroleum jelly, "Vaseline") over the wound (unless you have an allergy to this). We recommend that you use a new, sterile tube of Vaseline. Do not pick or remove scabs. Do not remove the yellow or white "healing tissue" from the base of the wound.  Cover the wound with fresh, clean, nonstick gauze and secure with paper tape. You may use Band-Aids in place of gauze and tape if the wound is small enough, but would recommend trimming much of the tape off as there is often too much. Sometimes Band-Aids can irritate the skin.  You should call the office for your biopsy report after 1 week if you have not already been contacted.  If you experience any problems, such as abnormal amounts of bleeding, swelling, significant bruising, significant pain, or evidence of infection, please call the office immediately.  FOR ADULT SURGERY PATIENTS: If you need something for pain relief you may take 1 extra strength Tylenol (acetaminophen) AND 2 Ibuprofen (200mg  each) together every 4 hours as needed for pain. (do not take these if you are allergic to them or if you have a reason you should not take them.) Typically, you may only need pain medication for 1 to 3 days.    Cryotherapy Aftercare  Wash gently with soap and water everyday.   Apply Vaseline and Band-Aid daily until healed.    Due to recent changes in healthcare laws, you may see results of your pathology and/or laboratory studies on MyChart before the doctors have had a chance to review them. We understand that in some cases there may be results that are confusing or concerning to you. Please understand that not all results are received at the same time and often the doctors may need to interpret multiple results in order to provide you with the best plan of care or course of treatment. Therefore, we ask that you  please give [place] 2 business days to thoroughly review all your results before contacting the office for clarification. Should we see a critical lab result, you will be contacted sooner.   If You Need Anything After Your Visit  If you have any questions or concerns for your doctor, please call our main line at [phone] and press option 4 to reach your doctor's medical assistant. If no one answers, please leave a voicemail as directed and we will return your call as soon as possible. Messages left after 4 pm will be answered the following business day.   You may also send [place] a message via MyChart. We typically respond to MyChart messages within 1-2 business days.  For prescription refills, please ask your pharmacy to contact our office. Our fax number is [phone].  If you have an urgent issue when the clinic is closed that cannot wait until the next business day, you can page your doctor at the number below.    Please note that while we do our best to be available for urgent issues outside of office hours, we are not available 24/7.   If you have an urgent issue and are unable to reach [place], you may choose to seek medical care at your doctor's office, retail clinic, urgent care center, or emergency room.  If you have a medical emergency, please immediately call 911 or go to the emergency department.  Pager Numbers  - Dr. Gwen Pounds: [phone]  -  Dr. Roseanne Reno: [phone]  - Dr. Katrinka Blazing: [phone]   In the event of inclement weather, please call our main line at [phone] for an update on the status of any delays or closures.  Dermatology Medication Tips: Please keep the boxes that topical medications come in in order to help keep track of the instructions about where and how to use these. Pharmacies typically print the medication instructions only on the boxes and not directly on the medication tubes.   If your medication is too expensive, please contact our office at  [phone] option 4 or send [place] a message through MyChart.   We are unable to tell what your co-pay for medications will be in advance as this is different depending on your insurance coverage. However, we may be able to find a substitute medication at lower cost or fill out paperwork to get insurance to cover a needed medication.   If a prior authorization is required to get your medication covered by your insurance company, please allow [place] 1-2 business days to complete this process.  Drug prices often vary depending on where the prescription is filled and some pharmacies may offer cheaper prices.  The website www.goodrx.com contains coupons for medications through different pharmacies. The prices here do not account for what the cost may be with help from insurance (it may be cheaper with your insurance), but the website can give you the price if you did not use any insurance.  - You can print the associated coupon and take it with your prescription to the pharmacy.  - You may also stop by our office during regular business hours and pick up a GoodRx coupon card.  - If you need your prescription sent electronically to a different pharmacy, notify our office through [facility] or by phone at [phone] option 4.     Si Usted Necesita Algo Despus de Su Visita  Tambin puede enviarnos un mensaje a travs de Clinical cytogeneticist. Por lo general respondemos a los mensajes de MyChart en el transcurso de 1 a 2 das hbiles.  Para renovar recetas, por favor pida a su farmacia que se ponga en contacto con nuestra oficina. Annie Sable de fax es [place] [phone].  Si tiene un asunto urgente cuando la clnica est cerrada y que no puede esperar hasta el siguiente da hbil, puede llamar/localizar a su doctor(a) al nmero que aparece a continuacin.   Por favor, tenga en cuenta que aunque hacemos todo lo posible para estar disponibles para asuntos urgentes fuera del horario de [place], no estamos  disponibles las 24 horas del da, los 7 [address]  [address] de la [place].   Si tiene un problema urgente y no puede comunicarse con nosotros, puede optar por buscar atencin mdica  en el consultorio de su doctor(a), en una clnica privada, en un centro de atencin urgente o en una sala de emergencias.  Si tiene Engineer, drilling, por favor llame inmediatamente al 911 o vaya a la sala de emergencias.  Nmeros de bper  - Dr. Gwen Pounds: [phone]  - Dra. Roseanne Reno: [phone]  - Dr. Katrinka Blazing: [phone]   En caso de inclemencias del tiempo, por favor llame a Lacy Duverney principal al [phone] para una actualizacin sobre el [place] de cualquier retraso o cierre.  Consejos para la medicacin en dermatologa: Por favor, guarde las cajas en las que vienen los medicamentos de uso tpico para ayudarle a seguir las instrucciones sobre dnde y cmo usarlos. Las farmacias generalmente imprimen las instrucciones del medicamento slo en las  cajas y no directamente en los tubos del medicamento.   Si su medicamento es muy caro, por favor, pngase en contacto con Rolm Gala llamando al [phone] y presione la opcin 4 o envenos un mensaje a travs de Clinical cytogeneticist.   No podemos decirle cul ser su copago por los medicamentos por adelantado ya que esto es diferente dependiendo de la cobertura de su seguro. Sin embargo, es posible que podamos encontrar un medicamento sustituto a Audiological scientist un formulario para que el seguro cubra el medicamento que se considera necesario.   Si se requiere una autorizacin previa para que su compaa de seguros [place] su medicamento, por favor permtanos de 1 a 2 das hbiles para completar [address]  Los precios de los medicamentos varan con frecuencia dependiendo del Environmental consultant de dnde se surte la receta y alguna farmacias pueden ofrecer precios ms baratos.  El sitio web www.goodrx.com tiene cupones para medicamentos de Health and safety inspector. Los precios aqu no  tienen en cuenta lo que podra costar con la ayuda del seguro (puede ser ms barato con su seguro), pero el sitio web puede darle el precio si no utiliz Tourist information centre manager.  - Puede imprimir el cupn correspondiente y llevarlo con su receta a la farmacia.  - Tambin puede pasar por nuestra oficina durante el horario de atencin regular y Education officer, museum una tarjeta de cupones de GoodRx.  - Si necesita que su receta se enve electrnicamente a una farmacia diferente, informe a nuestra oficina a travs de MyChart de  o por telfono llamando al [phone] y presione la opcin 4.

## 2023-01-03 LAB — SURGICAL PATHOLOGY

## 2023-01-06 ENCOUNTER — Telehealth: Payer: Self-pay

## 2023-01-06 NOTE — Telephone Encounter (Signed)
-----   Message from [name] sent at 01/06/2023 11:19 AM EDT ----- Skin , mid frontal scalp at crown BASAL CELL CARCINOMA, NODULAR PATTERN  BCC skin cancer- needs EDC vrs excision  - please call patient

## 2023-01-06 NOTE — Telephone Encounter (Signed)
Advised patient biopsy of the mid frontal scalp at crown was BCC. Discussed EDC vs excision, patient prefers EDC. Scheduled 01/27/23 at 9:45AM.

## 2023-01-22 DIAGNOSIS — H1045 Other chronic allergic conjunctivitis: Secondary | ICD-10-CM | POA: Diagnosis not present

## 2023-01-22 DIAGNOSIS — H26491 Other secondary cataract, right eye: Secondary | ICD-10-CM | POA: Diagnosis not present

## 2023-01-22 DIAGNOSIS — H04123 Dry eye syndrome of bilateral lacrimal glands: Secondary | ICD-10-CM | POA: Diagnosis not present

## 2023-01-27 ENCOUNTER — Ambulatory Visit (INDEPENDENT_AMBULATORY_CARE_PROVIDER_SITE_OTHER): Payer: Medicare Other | Admitting: Dermatology

## 2023-01-27 ENCOUNTER — Encounter: Payer: Self-pay | Admitting: Dermatology

## 2023-01-27 DIAGNOSIS — L578 Other skin changes due to chronic exposure to nonionizing radiation: Secondary | ICD-10-CM | POA: Diagnosis not present

## 2023-01-27 DIAGNOSIS — C4441 Basal cell carcinoma of skin of scalp and neck: Secondary | ICD-10-CM | POA: Diagnosis not present

## 2023-01-27 DIAGNOSIS — Z85828 Personal history of other malignant neoplasm of skin: Secondary | ICD-10-CM

## 2023-01-27 DIAGNOSIS — W908XXA Exposure to other nonionizing radiation, initial encounter: Secondary | ICD-10-CM | POA: Diagnosis not present

## 2023-01-27 NOTE — Progress Notes (Signed)
   Follow-Up Visit   Subjective  Julie Rivera is a 77 y.o. female who presents for the following: EDC of BCC at mid frontal scalp at crown. Bx: 01/01/2023.    The following portions of the chart were reviewed this encounter and updated as appropriate: medications, allergies, medical history  Review of Systems:  No other skin or systemic complaints except as noted in HPI or Assessment and Plan.  Objective  Well appearing patient in no apparent distress; mood and affect are within normal limits.  A focused examination was performed of the following areas: Face, scalp   Relevant exam findings are noted in the Assessment and Plan.  mid frontal scalp at crown 7 x 5 mm pink lesion    Assessment & Plan   ACTINIC DAMAGE - chronic, secondary to cumulative UV radiation exposure/sun exposure over time - diffuse scaly erythematous macules with underlying dyspigmentation - Recommend daily broad spectrum sunscreen SPF 30+ to sun-exposed areas, reapply every 2 hours as needed.  - Recommend staying in the shade or wearing long sleeves, sun glasses (UVA+UVB protection) and wide brim hats (4-inch brim around the entire circumference of the hat). - Call for new or changing lesions.   HISTORY OF BASAL CELL CARCINOMA OF THE SKIN - No evidence of recurrence today - Recommend regular full body skin exams - Recommend daily broad spectrum sunscreen SPF 30+ to sun-exposed areas, reapply every 2 hours as needed.  - Call if any new or changing lesions are noted between office visits   Basal cell carcinoma (BCC) of scalp mid frontal scalp at crown  Destruction of lesion  Destruction method: electrodesiccation and curettage   Timeout:  patient name, date of birth, surgical site, and procedure verified Anesthesia: the lesion was anesthetized in a standard fashion   Anesthetic:  1% lidocaine w/ epinephrine 1-100,000 buffered w/ 8.4% NaHCO3 Curettage performed in three different directions: Yes    Electrodesiccation performed over the curetted area: Yes   Curettage cycles:  3 Lesion length (cm):  7 Lesion width (cm):  5 Margin per side (cm):  0.5 Final wound size (cm):  8 Hemostasis achieved with:  pressure, aluminum chloride and electrodesiccation Outcome: patient tolerated procedure well with no complications   Post-procedure details: wound care instructions given      Return for Follow Up As Scheduled.  I, Lawson Radar, CMA, am acting as scribe for [name], MD.   Documentation: I have reviewed the above documentation for accuracy and completeness, and I agree with the above.  [name], MD

## 2023-01-27 NOTE — Patient Instructions (Addendum)
Wound Care Instructions  Cleanse wound gently with shampoo and water once a day then pat dry with clean gauze. Apply a thin coat of Petrolatum (petroleum jelly, "Vaseline") over the wound (unless you have an allergy to this). We recommend that you use a new, sterile tube of Vaseline. Do not pick or remove scabs. Do not remove the yellow or white "healing tissue" from the base of the wound.  Cover the wound with fresh, clean, nonstick gauze and secure with paper tape. You may use Band-Aids in place of gauze and tape if the wound is small enough, but would recommend trimming much of the tape off as there is often too much. Sometimes Band-Aids can irritate the skin.  You should call the office for your biopsy report after 1 week if you have not already been contacted.  If you experience any problems, such as abnormal amounts of bleeding, swelling, significant bruising, significant pain, or evidence of infection, please call the office immediately.  FOR ADULT SURGERY PATIENTS: If you need something for pain relief you may take 1 extra strength Tylenol (acetaminophen) AND 2 Ibuprofen (200mg  each) together every 4 hours as needed for pain. (do not take these if you are allergic to them or if you have a reason you should not take them.) Typically, you may only need pain medication for 1 to 3 days.     Recommend daily broad spectrum sunscreen SPF 30+ to sun-exposed areas, reapply every 2 hours as needed. Call for new or changing lesions.  Staying in the shade or wearing long sleeves, sun glasses (UVA+UVB protection) and wide brim hats (4-inch brim around the entire circumference of the hat) are also recommended for sun protection.      Due to recent changes in healthcare laws, you may see results of your pathology and/or laboratory studies on MyChart before the doctors have had a chance to review them. We understand that in some cases there may be results that are confusing or concerning to you. Please  understand that not all results are received at the same time and often the doctors may need to interpret multiple results in order to provide you with the best plan of care or course of treatment. Therefore, we ask that you please give [place] 2 business days to thoroughly review all your results before contacting the office for clarification. Should we see a critical lab result, you will be contacted sooner.   If You Need Anything After Your Visit  If you have any questions or concerns for your doctor, please call our main line at [phone] and press option 4 to reach your doctor's medical assistant. If no one answers, please leave a voicemail as directed and we will return your call as soon as possible. Messages left after 4 pm will be answered the following business day.   You may also send [place] a message via MyChart. We typically respond to MyChart messages within 1-2 business days.  For prescription refills, please ask your pharmacy to contact our office. Our fax number is [phone].  If you have an urgent issue when the clinic is closed that cannot wait until the next business day, you can page your doctor at the number below.    Please note that while we do our best to be available for urgent issues outside of office hours, we are not available 24/7.   If you have an urgent issue and are unable to reach [place], you may choose to seek medical care at  your doctor's office, retail clinic, urgent care center, or emergency room.  If you have a medical emergency, please immediately call 911 or go to the emergency department.  Pager Numbers  - Dr. Gwen Pounds: [phone]  - Dr. Roseanne Reno: [phone]  - Dr. Katrinka Blazing: [phone]   In the event of inclement weather, please call our main line at [phone] for an update on the status of any delays or closures.  Dermatology Medication Tips: Please keep the boxes that topical medications come in in order to help keep track of the instructions  about where and how to use these. Pharmacies typically print the medication instructions only on the boxes and not directly on the medication tubes.   If your medication is too expensive, please contact our office at [phone] option 4 or send [place] a message through MyChart.   We are unable to tell what your co-pay for medications will be in advance as this is different depending on your insurance coverage. However, we may be able to find a substitute medication at lower cost or fill out paperwork to get insurance to cover a needed medication.   If a prior authorization is required to get your medication covered by your insurance company, please allow [place] 1-2 business days to complete this process.  Drug prices often vary depending on where the prescription is filled and some pharmacies may offer cheaper prices.  The website www.goodrx.com contains coupons for medications through different pharmacies. The prices here do not account for what the cost may be with help from insurance (it may be cheaper with your insurance), but the website can give you the price if you did not use any insurance.  - You can print the associated coupon and take it with your prescription to the pharmacy.  - You may also stop by our office during regular business hours and pick up a GoodRx coupon card.  - If you need your prescription sent electronically to a different pharmacy, notify our office through [facility] or by phone at [phone] option 4.     Si Usted Necesita Algo Despus de Su Visita  Tambin puede enviarnos un mensaje a travs de Clinical cytogeneticist. Por lo general respondemos a los mensajes de MyChart en el transcurso de 1 a 2 das hbiles.  Para renovar recetas, por favor pida a su farmacia que se ponga en contacto con nuestra oficina. Annie Sable de fax es [place] [phone].  Si tiene un asunto urgente cuando la clnica est cerrada y que no puede esperar hasta el siguiente da hbil, puede  llamar/localizar a su doctor(a) al nmero que aparece a continuacin.   Por favor, tenga en cuenta que aunque hacemos todo lo posible para estar disponibles para asuntos urgentes fuera del horario de [place], no estamos disponibles las 24 horas del da, los 7 [address]  [address] de la [place].   Si tiene un problema urgente y no puede comunicarse con nosotros, puede optar por buscar atencin mdica  en el consultorio de su doctor(a), en una clnica privada, en un centro de atencin urgente o en una sala de emergencias.  Si tiene Engineer, drilling, por favor llame inmediatamente al 911 o vaya a la sala de emergencias.  Nmeros de bper  - Dr. Gwen Pounds: [phone]  - Dra. Roseanne Reno: [phone]  - Dr. Katrinka Blazing: [phone]   En caso de inclemencias del tiempo, por favor llame a Lacy Duverney principal al [phone] para una actualizacin sobre el [place] de cualquier retraso o cierre.  Consejos para la medicacin  en dermatologa: Por favor, guarde las cajas en las que vienen los medicamentos de uso tpico para ayudarle a seguir las instrucciones sobre dnde y cmo usarlos. Las farmacias generalmente imprimen las instrucciones del medicamento slo en las cajas y no directamente en los tubos del [place].   Si su medicamento es muy caro, por favor, pngase en contacto con Rolm Gala llamando al [phone] y presione la opcin 4 o envenos un mensaje a travs de Clinical cytogeneticist.   No podemos decirle cul ser su copago por los medicamentos por adelantado ya que esto es diferente dependiendo de la cobertura de su seguro. Sin embargo, es posible que podamos encontrar un medicamento sustituto a Audiological scientist un formulario para que el seguro cubra el medicamento que se considera necesario.   Si se requiere una autorizacin previa para que su compaa de seguros [place] su medicamento, por favor permtanos de 1 a 2 das hbiles para completar [address]  Los precios de los medicamentos varan con  frecuencia dependiendo del Environmental consultant de dnde se surte la receta y alguna farmacias pueden ofrecer precios ms baratos.  El sitio web www.goodrx.com tiene cupones para medicamentos de Health and safety inspector. Los precios aqu no tienen en cuenta lo que podra costar con la ayuda del seguro (puede ser ms barato con su seguro), pero el sitio web puede darle el precio si no utiliz Tourist information centre manager.  - Puede imprimir el cupn correspondiente y llevarlo con su receta a la farmacia.  - Tambin puede pasar por nuestra oficina durante el horario de atencin regular y Education officer, museum una tarjeta de cupones de GoodRx.  - Si necesita que su receta se enve electrnicamente a una farmacia diferente, informe a nuestra oficina a travs de MyChart de [place] o por telfono llamando al [phone] y presione la opcin 4.

## 2023-02-07 ENCOUNTER — Inpatient Hospital Stay: Payer: Medicare Other | Admitting: Hematology and Oncology

## 2023-02-07 NOTE — Assessment & Plan Note (Deleted)
05/21/2019: Screening mammogram detected an asymmetry and calcifications in the left breast calcifications span 4.5 cm the palpable lump inside of that measured 1.7 cm. Diagnostic mammogram showed a 1.9cm mass and calcifications in the upper outer left breast.  Biopsy revealed grade 2 invasive ductal carcinoma ER/PR positive HER-2 negative with a Ki-67 of 15%   07/29/2019:Left lumpectomy ([facility]): IDC, grade 1, 2.4cm, with low grade DCIS, 2 left axillary lymph nodes negative.  ER/PR positive HER-2 negative with a Ki-67 of 15% positive anterior and inferior margins   Treatment plan: Resection of the positive margins 1.  Adjuvant radiation therapy 08/26/19-09/17/19 2.  Followed by adjuvant antiestrogen therapy: anastrozole 1 mg daily  switched to letrozole stopped 12/19/19 changed to Tamoxifen 12/31/19 due to hot flashes and insomnia and knee pain and fatigue, patient could not tolerate even 10 mg of tamoxifen.  Antiestrogen therapy discontinued  2021   Severe hot flashes: off Ambien regularly.Slightly better.   Breast cancer surveillance:  Mammogram 10/17/2022: [facility]: Benign, breast density category C   Bone density 10/14/2019: T score -1.8   Abdominal pain and back pain: CT CAP 03/07/2022: Mild mediastinal and bilateral hilar lymphadenopathy new since 2019.  2.5 cm benign-appearing right adnexal cyst.   PET CT scan 03/27/2022: Multiple mildly enlarged and hypermetabolic mediastinal and hilar lymph nodes are nonspecific.  Alternative diagnoses like sarcoidosis or inflammatory process should be considered.  Multiple small nodules bilaterally nonspecific.  Patient saw pulmonary and has not had any further workup with scans since that time. Recommend obtaining another CT chest for further evaluation

## 2023-02-18 ENCOUNTER — Other Ambulatory Visit: Payer: Self-pay | Admitting: Dermatology

## 2023-02-18 ENCOUNTER — Inpatient Hospital Stay: Payer: Medicare Other | Attending: Hematology and Oncology | Admitting: Hematology and Oncology

## 2023-02-18 ENCOUNTER — Telehealth: Payer: Self-pay | Admitting: Pulmonary Disease

## 2023-02-18 DIAGNOSIS — C50412 Malignant neoplasm of upper-outer quadrant of left female breast: Secondary | ICD-10-CM

## 2023-02-18 DIAGNOSIS — Z170 Estrogen receptor positive status [ER+]: Secondary | ICD-10-CM

## 2023-02-18 NOTE — Assessment & Plan Note (Signed)
05/21/2019: Screening mammogram detected an asymmetry and calcifications in the left breast calcifications span 4.5 cm the palpable lump inside of that measured 1.7 cm. Diagnostic mammogram showed a 1.9cm mass and calcifications in the upper outer left breast.  Biopsy revealed grade 2 invasive ductal carcinoma ER/PR positive HER-2 negative with a Ki-67 of 15%   07/29/2019:Left lumpectomy ([facility]): IDC, grade 1, 2.4cm, with low grade DCIS, 2 left axillary lymph nodes negative.  ER/PR positive HER-2 negative with a Ki-67 of 15% positive anterior and inferior margins   Treatment plan: Resection of the positive margins 1.  Adjuvant radiation therapy 08/26/19-09/17/19 2.  Followed by adjuvant antiestrogen therapy: anastrozole 1 mg daily  switched to letrozole stopped 12/19/19 changed to Tamoxifen 12/31/19 due to hot flashes and insomnia and knee pain and fatigue, patient could not tolerate even 10 mg of tamoxifen.  Antiestrogen therapy discontinued  2021   Severe hot flashes: off Ambien regularly.Slightly better.   Breast cancer surveillance:  Mammogram 10/17/2022: [facility]: Benign, breast density category C   Bone density 10/14/2019: T score -1.8   Abdominal pain and back pain: CT CAP 03/07/2022: Mild mediastinal and bilateral hilar lymphadenopathy new since 2019.  2.5 cm benign-appearing right adnexal cyst.   PET CT scan 03/27/2022: Multiple mildly enlarged and hypermetabolic mediastinal and hilar lymph nodes are nonspecific.  Alternative diagnoses like sarcoidosis or inflammatory process should be considered.  Multiple small nodules bilaterally nonspecific.  Patient was referred to pulmonary Dr. Tonia Brooms who recommended a monitoring with repeat CT scans.  However she has not undergone those scans.  The plan was to do bronchoscopy and biopsy if the lymph nodes were to get larger.

## 2023-02-18 NOTE — Telephone Encounter (Signed)
Patient would like to schedule CT scan. Patient phone number is [phone].

## 2023-02-18 NOTE — Progress Notes (Signed)
HEMATOLOGY-ONCOLOGY TELEPHONE VISIT PROGRESS NOTE  I connected with our patient on 02/18/23 at  9:00 AM EST by telephone and verified that I am speaking with the correct person using two identifiers.  I discussed the limitations, risks, security and privacy concerns of performing an evaluation and management service by telephone and the availability of in person appointments.  I also discussed with the patient that there may be a patient responsible charge related to this service. The patient expressed understanding and agreed to proceed.   History of Present Illness:  Discussed the use of AI scribe software for clinical note transcription with the patient, who gave verbal consent to proceed.  History of Present Illness   Julie Rivera, a patient with a history breast cancer and lung nodules, presents for a follow-up consultation. She reports overall excellent health, with the exception of dry eyes. She had a CT and PET scan a year ago that revealed lung nodules, and she followed up with a pulmonologist, Dr. Tonia Brooms. The pulmonologist was unsure of the nature of the nodules and suggested a repeat scan in six months, which the patient has not yet done due to inconvenience. Dr.Icard also mentioned the possibility of a biopsy if the nodules were still present, but the patient is hesitant due to the procedure's 50% accuracy rate. She is currently inclined to leave the nodules alone unless advised otherwise.        Oncology History  Malignant neoplasm of upper-outer quadrant of left breast in female, estrogen receptor positive (HCC)  05/21/2019 Initial Diagnosis   Screening mammogram detected an asymmetry and calcifications 4.5 cm in the left breast. Diagnostic mammogram showed a 1.9cm mass and calcifications in the upper outer left breast.  Biopsy revealed grade 2 IDC ER/PR positive HER-2 negative with a Ki-67 of 5%   07/29/2019 Surgery   Left lumpectomy Donell Beers) ([id number]): IDC, grade 1, 2.4cm,  with low grade DCIS, 2 left axillary lymph nodes negative. Broadly present at the anterior margin of specimen B and focally present at the inferior margin of specimen B. Focally present at the lateral margin of specimen B.   07/29/2019 Cancer Staging   Staging form: Breast, AJCC 8th Edition - Pathologic stage from 07/29/2019: Stage IA (pT2, pN0, cM0, G1, ER+, PR+, HER2-)   08/25/2019 - 09/23/2019 Radiation Therapy   The patient initially received a dose of 40.05 Gy in 15 fractions to the breast using whole-breast tangent fields. This was delivered using a 3-D conformal technique. The pt received a boost delivering an additional 12 Gy in 6 fractions using a electron boost with electrons. The total dose was 52.05 Gy.   10/2019 -  Anti-estrogen oral therapy   Anastrozole, discontinued 12/31/19 due to insomnia, hot flashes, and joint pain; switched to tamoxifen 12/31/19     REVIEW OF SYSTEMS:   Constitutional: Denies fevers, chills or abnormal weight loss All other systems were reviewed with the patient and are negative. Observations/Objective:     Assessment Plan:  Malignant neoplasm of upper-outer quadrant of left breast in female, estrogen receptor positive (HCC) 05/21/2019: Screening mammogram detected an asymmetry and calcifications in the left breast calcifications span 4.5 cm the palpable lump inside of that measured 1.7 cm. Diagnostic mammogram showed a 1.9cm mass and calcifications in the upper outer left breast.  Biopsy revealed grade 2 invasive ductal carcinoma ER/PR positive HER-2 negative with a Ki-67 of 15%   07/29/2019:Left lumpectomy ([facility]): IDC, grade 1, 2.4cm, with low grade DCIS, 2 left axillary lymph nodes  negative.  ER/PR positive HER-2 negative with a Ki-67 of 15% positive anterior and inferior margins   Treatment plan: Resection of the positive margins 1.  Adjuvant radiation therapy 08/26/19-09/17/19 2.  Followed by adjuvant antiestrogen therapy: anastrozole 1 mg daily   switched to letrozole stopped 12/19/19 changed to Tamoxifen 12/31/19 due to hot flashes and insomnia and knee pain and fatigue, patient could not tolerate even 10 mg of tamoxifen.  Antiestrogen therapy discontinued  2021   Severe hot flashes: off Ambien regularly.Slightly better.   Breast cancer surveillance:  Mammogram 10/17/2022: [facility]: Benign, breast density category C   Bone density 10/14/2019: T score -1.8   Abdominal pain and back pain: CT CAP 03/07/2022: Mild mediastinal and bilateral hilar lymphadenopathy new since 2019.  2.5 cm benign-appearing right adnexal cyst.   PET CT scan 03/27/2022: Multiple mildly enlarged and hypermetabolic mediastinal and hilar lymph nodes are nonspecific.  Alternative diagnoses like sarcoidosis or inflammatory process should be considered.  Multiple small nodules bilaterally nonspecific.  Patient was referred to pulmonary Dr. Tonia Brooms who recommended a monitoring with repeat CT scans.  However she has not undergone those scans.  The plan was to do bronchoscopy and biopsy if the lymph nodes were to get larger. --------------------------------- Assessment and Plan    Lung Nodule Unclear etiology, with a previous recommendation for a follow-up scan in 6 months. The patient has not yet completed this scan. Discussed the potential outcomes of the nodule (stability, shrinkage, or growth) and the importance of ruling out growth. The patient expressed concern about the potential need for a biopsy, which was discussed as a possibility if the nodule is larger on the next scan. -Patient will call Dr. Tonia Brooms to schedule a follow-up scan to assess the nodule. -If the nodule is larger on the next scan, consider a biopsy.   I discussed the assessment and treatment plan with the patient. The patient was provided an opportunity to ask questions and all were answered. The patient agreed with the plan and demonstrated an understanding of the instructions. The patient was advised to  call back or seek an in-person evaluation if the symptoms worsen or if the condition fails to improve as anticipated.   I provided 12 minutes of non-face-to-face time during this encounter.  This includes time for charting and coordination of care   Tamsen Meek, MD

## 2023-02-19 DIAGNOSIS — H04123 Dry eye syndrome of bilateral lacrimal glands: Secondary | ICD-10-CM | POA: Diagnosis not present

## 2023-02-19 DIAGNOSIS — H1045 Other chronic allergic conjunctivitis: Secondary | ICD-10-CM | POA: Diagnosis not present

## 2023-02-19 DIAGNOSIS — H26491 Other secondary cataract, right eye: Secondary | ICD-10-CM | POA: Diagnosis not present

## 2023-03-10 ENCOUNTER — Ambulatory Visit
Admission: RE | Admit: 2023-03-10 | Discharge: 2023-03-10 | Disposition: A | Discharge status: Home / Self Care | Payer: Medicare Other | Source: Ambulatory Visit | Attending: Pulmonary Disease | Admitting: Pulmonary Disease

## 2023-03-10 DIAGNOSIS — R599 Enlarged lymph nodes, unspecified: Secondary | ICD-10-CM | POA: Insufficient documentation

## 2023-03-10 DIAGNOSIS — R918 Other nonspecific abnormal finding of lung field: Secondary | ICD-10-CM | POA: Diagnosis not present

## 2023-03-17 NOTE — Telephone Encounter (Signed)
Patient would like results of CT scan. Patient phone number is [phone].

## 2023-03-17 NOTE — Telephone Encounter (Signed)
Pt informed results not ready yet. Explained shortage in radiologist within system. Pt not very happy that she is having to wait, she wanted a timeline. Informed I could give her one b/c we don't know when it will be resulted.

## 2023-03-28 ENCOUNTER — Encounter: Payer: Self-pay | Admitting: Nurse Practitioner

## 2023-03-28 ENCOUNTER — Telehealth: Payer: Medicare Other | Admitting: Nurse Practitioner

## 2023-03-28 DIAGNOSIS — R599 Enlarged lymph nodes, unspecified: Secondary | ICD-10-CM | POA: Diagnosis not present

## 2023-03-28 DIAGNOSIS — R918 Other nonspecific abnormal finding of lung field: Secondary | ICD-10-CM | POA: Diagnosis not present

## 2023-03-28 NOTE — Patient Instructions (Signed)
Repeat CT chest in one year  Follow up in one year after CT chest with Dr. Tonia Brooms. If symptoms worsen, please contact office for sooner follow up or seek emergency care.

## 2023-03-28 NOTE — Assessment & Plan Note (Signed)
Numerous nodules in each lung on imaging with growth over time when compared to prior exams.  Adenopathy is stable.  Findings are most consistent with a process such as sarcoidosis or alternative benign/inflammatory process.  She is a never smoker and given presentation, low suspicion for malignancy; however, discussed with patient that we are unable to entirely exclude this given slow growth over time. Discussed options with patient including bronchoscopy with biopsy, mediastinoscopy with biopsy or continued observation. Given her age and lack of symptoms, she does not want to undergo any invasive procedures. Did offer referral to [facility] for consultation but she is not interested. Recommended repeat imaging in 6 months for monitoring. She would prefer to wait for a year. Understands risks of waiting/not monitoring, including disease spread should this be a malignancy. She will monitor for any new symptoms and notify. CT chest order placed for 1 year.   Patient Instructions  Repeat CT chest in one year  Follow up in one year after CT chest with Dr. Tonia Brooms. If symptoms worsen, please contact office for sooner follow up or seek emergency care.

## 2023-03-28 NOTE — Assessment & Plan Note (Signed)
See above

## 2023-03-28 NOTE — Progress Notes (Signed)
Patient ID: Julie Rivera, female     DOB: 1945/07/31, 77 y.o.      MRN: [id number]  No chief complaint on file.   Virtual Visit via Video Note  I connected with Tama Headings on 03/28/23 at  4:00 PM EST by a video enabled telemedicine application and verified that I am speaking with the correct person using two identifiers.  Location: Patient: Home Provider: Office   I discussed the limitations of evaluation and management by telemedicine and the availability of in person appointments. The patient expressed understanding and agreed to proceed.  History of Present Illness: 77 year old female, never smoker followed for lung nodules and adenopathy. She is a patient of Dr. Myrlene Broker and last seen in office 04/17/2022. Past medical history significant for HLD, insomnia, stage I left breast cancer.  TESTS/EVENTS: 03/25/2022 PET scan: Multiple mildly enlarged and hypermetabolic mediastinal and hilar lymph nodes, nonspecific.  Nodal pattern is atypical for metastatic breast cancer and alternative diagnosis such as sarcoidosis or other inflammatory process should be considered.  Multiple small pulmonary nodules bilaterally, some of which are new or enlarged compared with CT from 2020.  No hypermetabolism.  Postsurgical changes in the left breast and axilla without hypermetabolic activity.  Nonobstructing left renal calculi.  Atherosclerosis. 03/10/2023 CT chest wo contrast: Multiple prominent mediastinal and hilar lymph nodes, similar to prior.  Stable postsurgical changes in left axilla.  Innumerable enlarging solid nodules in both lungs, primarily subpleural and peribronchovascular.  Nodule in the minor fissure measuring 9 x 9 mm and medial subpleural left lower lobe nodule measuring 7 x 6 mm.  Overall more than 20 nodules in each lung.  No confluent airspace disease.  Underlying azygous fissure noted.  Stable postradiation changes in the left upper lobe.  Simple cyst in the upper pole of the  left kidney.  No follow-up needed.  Stable postlumpectomy.  04/17/2022: OV with Dr. Tonia Brooms.  Consultation for abnormal CT scan.  Initially diagnosed in 2021 with stage Ia ER/PR positive HER2 negative malignancy.  Had PET scan completed by oncology which revealed hypermetabolic lymph nodes within the bilateral hilum and mediastinal locations.  She had scattered pulmonary nodules.  Imaging concerning for sarcoidosis.  No prior malignancies except for breast cancer.  No Family history of sarcoidosis.  No ongoing respiratory symptoms. Discussed risks and benefits of potential workups including bronchoscopy with tissue sampling, mediastinoscopy and biopsy with [facility] or watchful waiting with repeat CT in 3-6 months. Pt opted for CT imaging   03/28/2023: Today - follow up Patient presents today for CT follow-up.  Adenopathy was stable compared to prior exams.  She did have some slow growth to the lung nodules in both lungs.  She has numerous nodules, over 20 in each lung, with largest measuring 9 x 9 mm and minor fissure.  These findings are most consistent with a benign/inflammatory process, possibly sarcoidosis.  Metastatic disease is unlikely but not entirely excluded.  She tells me today that she is not having any respiratory problems.  She will occasionally get some chest congestion but she relates this to weather allergies for the most part.  Does not occur on a daily basis.  Does not have any significant cough.  No issues with her breathing.  Denies any hemoptysis, weight loss, anorexia, night sweats or fevers, chills.  She prefers to remain as conservative as possibly did not really want to undergo any further or have any repeat scans given her lack of symptoms and overall  Allergies  Allergen Reactions   Wasp Venom Anaphylaxis    Throat closed up.    Gabapentin     Unusual dreams   Immunization History  Administered Date(s) Administered   DTaP 07/05/2016   Influenza, High Dose Seasonal PF 12/17/2016    Influenza-Unspecified 12/13/2017, 01/20/2018, 12/30/2018   PFIZER Comirnaty(Gray Top)Covid-19 Tri-Sucrose Vaccine 04/29/2019, 05/20/2019   PFIZER(Purple Top)SARS-COV-2 Vaccination 02/07/2020   Pneumococcal Conjugate-13 04/09/2015   Pneumococcal Polysaccharide-23 07/05/2016   Tdap 07/05/2016   Zoster Recombinant(Shingrix) 12/12/2017, 02/16/2018   Zoster, Live 01/20/2017   Past Medical History:  Diagnosis Date   Actinic keratosis    Allergy    Basal cell carcinoma 03/22/2019   L nasal tip, MOHs 05/18/2019   Basal cell carcinoma 03/26/2010   L lat upper chest, L chest infer clavicle   Basal cell carcinoma 09/12/2009   L medial pretibial   Basal cell carcinoma 01/03/2020   L alar crease, txted with 5FU, EDC 04/09/22   Basal cell carcinoma 01/01/2023   mid frontal scalp at crown, EDC sched 01/27/23 9:45AM   Chicken pox    Kidney stones    Squamous cell carcinoma of skin 04/22/2016   R ant thigh medial, R ant thigh lateral    Tobacco History: Social History   Tobacco Use  Smoking Status Never  Smokeless Tobacco Never   Counseling given: Not Answered   Outpatient Medications Prior to Visit  Medication Sig Dispense Refill   clobetasol (TEMOVATE) 0.05 % external solution Apply 1 Application topically as directed. mix in 1 tub of Cerave cream, Qd to bid aa itchy rash on back, avoid face, groin, axilla 50 mL 0   conjugated estrogens-medroxyprogesteron (PREMPHASE) TABS tablet Take 1 tablet by mouth daily. (Patient not taking: Reported on 04/17/2022)     EPINEPHrine 0.3 mg/0.3 mL IJ SOAJ injection Inject 0.3 mg into the muscle as needed for anaphylaxis. 2 each 1   gatifloxacin (ZYMAXID) 0.5 % SOLN SMARTSIG:In Eye(s) (Patient not taking: Reported on 04/17/2022)     meloxicam (MOBIC) 7.5 MG tablet TAKE 1 TABLET BY MOUTH EVERY DAY AS NEEDED FOR PAIN 60 tablet 1   minoxidil (LONITEN) 2.5 MG tablet TAKE 1 TABLET BY MOUTH DAILY 30 tablet 2   Roflumilast (ZORYVE) 0.3 % CREA Apply to  affected skin once a daily 60 g 1   tobramycin-dexamethasone (TOBRADEX) ophthalmic solution SMARTSIG:In Eye(s) (Patient not taking: Reported on 12/17/2021)     TYRVAYA 0.03 MG/ACT SOLN Place into both nostrils. (Patient not taking: Reported on 12/17/2021)     No facility-administered medications prior to visit.     Review of Systems:   Constitutional: No weight loss or gain, night sweats, fevers, chills, fatigue, or lassitude. HEENT: No headaches, difficulty swallowing, tooth/dental problems, or sore throat. +occasional allergies  CV:  No chest pain, orthopnea, PND, swelling in lower extremities, anasarca, dizziness, palpitations, syncope Resp: +occasional chest congestion. No shortness of breath with exertion or at rest. No excess mucus or change in color of mucus. No productive or non-productive. No hemoptysis. No wheezing.  No chest wall deformity GI:  No heartburn, indigestion, abdominal pain, nausea, vomiting, diarrhea, change in bowel habits, loss of appetite, bloody stools.  GU: No dysuria, change in color of urine, urgency or frequency.  No flank pain, no hematuria  Skin: No rash, lesions, ulcerations MSK:  No joint pain or swelling.   Neuro: No dizziness or lightheadedness.  Psych: No depression or anxiety. Mood stable.   Observations/Objective: Patient is well-developed, well-nourished in no acute  distress. A&Ox3.  Resting comfortably at home. Unlabored breathing. Speech is clear and coherent with logical content.    Assessment and Plan: Lung nodule, multiple Numerous nodules in each lung on imaging with growth over time when compared to prior exams.  Adenopathy is stable.  Findings are most consistent with a process such as sarcoidosis or alternative benign/inflammatory process.  She is a never smoker and given presentation, low suspicion for malignancy; however, discussed with patient that we are unable to entirely exclude this given slow growth over time. Discussed options with  patient including bronchoscopy with biopsy, mediastinoscopy with biopsy or continued observation. Given her age and lack of symptoms, she does not want to undergo any invasive procedures. Did offer referral to [facility] for consultation but she is not interested. Recommended repeat imaging in 6 months for monitoring. She would prefer to wait for a year. Understands risks of waiting/not monitoring, including disease spread should this be a malignancy. She will monitor for any new symptoms and notify. CT chest order placed for 1 year.   Patient Instructions  Repeat CT chest in one year  Follow up in one year after CT chest with Dr. Tonia Brooms. If symptoms worsen, please contact office for sooner follow up or seek emergency care.    Adenopathy See above     I discussed the assessment and treatment plan with the patient. The patient was provided an opportunity to ask questions and all were answered. The patient agreed with the plan and demonstrated an understanding of the instructions.   The patient was advised to call back or seek an in-person evaluation if the symptoms worsen or if the condition fails to improve as anticipated.  I provided 31 minutes of non-face-to-face time during this encounter.   Noemi Chapel, NP

## 2023-04-11 ENCOUNTER — Telehealth: Payer: Medicare Other | Admitting: Nurse Practitioner

## 2023-04-18 DIAGNOSIS — N8111 Cystocele, midline: Secondary | ICD-10-CM | POA: Diagnosis not present

## 2023-04-18 DIAGNOSIS — N812 Incomplete uterovaginal prolapse: Secondary | ICD-10-CM | POA: Diagnosis not present

## 2023-04-18 DIAGNOSIS — Z4689 Encounter for fitting and adjustment of other specified devices: Secondary | ICD-10-CM | POA: Diagnosis not present

## 2023-04-22 ENCOUNTER — Ambulatory Visit (INDEPENDENT_AMBULATORY_CARE_PROVIDER_SITE_OTHER): Payer: Medicare Other | Admitting: Dermatology

## 2023-04-22 DIAGNOSIS — L988 Other specified disorders of the skin and subcutaneous tissue: Secondary | ICD-10-CM | POA: Diagnosis not present

## 2023-04-22 DIAGNOSIS — L570 Actinic keratosis: Secondary | ICD-10-CM | POA: Diagnosis not present

## 2023-04-22 DIAGNOSIS — Z85828 Personal history of other malignant neoplasm of skin: Secondary | ICD-10-CM | POA: Diagnosis not present

## 2023-04-22 DIAGNOSIS — W908XXA Exposure to other nonionizing radiation, initial encounter: Secondary | ICD-10-CM

## 2023-04-22 DIAGNOSIS — L578 Other skin changes due to chronic exposure to nonionizing radiation: Secondary | ICD-10-CM | POA: Diagnosis not present

## 2023-04-22 NOTE — Progress Notes (Signed)
 Follow-Up Visit   Subjective  Julie Rivera is a 78 y.o. female who presents for the following: Botox for facial elastosis  Patient also reports some spots at face she would like checked.   The following portions of the chart were reviewed this encounter and updated as appropriate: medications, allergies, medical history  Review of Systems:  No other skin or systemic complaints except as noted in HPI or Assessment and Plan.  Objective  Well appearing patient in no apparent distress; mood and affect are within normal limits.  A focused examination was performed of the face.  Relevant physical exam findings are noted in the Assessment and Plan.    left nasal ala x 1, left anterior ala crease x 1, right paranasal x 1 (3) 1 mm crusted macule at left nasal ala, 3 mm pink scaly macule at right paranasal , crusted macule at left anterior ala crease   Assessment & Plan   ACTINIC KERATOSIS (3) left nasal ala x 1, left anterior ala crease x 1, right paranasal x 1 (3) Aks vs early bcc, recheck on f/up  Actinic keratoses are precancerous spots that appear secondary to cumulative UV radiation exposure/sun exposure over time. They are chronic with expected duration over 1 year. A portion of actinic keratoses will progress to squamous cell carcinoma of the skin. It is not possible to reliably predict which spots will progress to skin cancer and so treatment is recommended to prevent development of skin cancer.  Recommend daily broad spectrum sunscreen SPF 30+ to sun-exposed areas, reapply every 2 hours as needed.  Recommend staying in the shade or wearing long sleeves, sun glasses (UVA+UVB protection) and wide brim hats (4-inch brim around the entire circumference of the hat). Call for new or changing lesions. Destruction of lesion - left nasal ala x 1, left anterior ala crease x 1, right paranasal x 1 (3)  Destruction method: cryotherapy   Informed consent: discussed and consent obtained    Lesion destroyed using liquid nitrogen: Yes   Region frozen until ice ball extended beyond lesion: Yes   Outcome: patient tolerated procedure well with no complications   Post-procedure details: wound care instructions given   Additional details:  Prior to procedure, discussed risks of blister formation, small wound, skin dyspigmentation, or rare scar following cryotherapy. Recommend Vaseline ointment to treated areas while healing.  ACTINIC SKIN DAMAGE   ELASTOSIS OF SKIN   HISTORY OF BASAL CELL CARCINOMA (BCC) OF SKIN    ACTINIC DAMAGE - chronic, secondary to cumulative UV radiation exposure/sun exposure over time - diffuse scaly erythematous macules with underlying dyspigmentation - Recommend daily broad spectrum sunscreen SPF 30+ to sun-exposed areas, reapply every 2 hours as needed.  - Recommend staying in the shade or wearing long sleeves, sun glasses (UVA+UVB protection) and wide brim hats (4-inch brim around the entire circumference of the hat). - Call for new or changing lesions.    Facial Elastosis  Location: See attached image  Informed consent: Discussed risks (infection, pain, bleeding, bruising, swelling, allergic reaction, paralysis of nearby muscles, eyelid droop, double vision, neck weakness, difficulty breathing, headache, undesirable cosmetic result, and need for additional treatment) and benefits of the procedure, as well as the alternatives.  Informed consent was obtained.  Preparation: The area was cleansed with alcohol.  Procedure Details:  Botox was injected into the dermis with a 30-gauge needle. Pressure applied to any bleeding. Ice packs offered for swelling.  Lot Number:  [id number] Expiration:  02/2025  Total  Units Injected:  25  Plan: Tylenol  may be used for headache.  Allow 2 weeks before returning to clinic for additional dosing as needed. Patient will call for any problems.   HISTORY OF BASAL CELL CARCINOMA OF THE SKIN - No evidence of  recurrence today - Recommend regular full body skin exams - Recommend daily broad spectrum sunscreen SPF 30+ to sun-exposed areas, reapply every 2 hours as needed.  - Call if any new or changing lesions are noted between office visits    Return for keep follow up in april .  I, Eleanor Blush, CMA, am acting as scribe for Rexene Rattler, MD.   Documentation: I have reviewed the above documentation for accuracy and completeness, and I agree with the above.  Rexene Rattler, MD

## 2023-04-22 NOTE — Patient Instructions (Addendum)
Actinic keratoses are precancerous spots that appear secondary to cumulative UV radiation exposure/sun exposure over time. They are chronic with expected duration over 1 year. A portion of actinic keratoses will progress to squamous cell carcinoma of the skin. It is not possible to reliably predict which spots will progress to skin cancer and so treatment is recommended to prevent development of skin cancer.  Recommend daily broad spectrum sunscreen SPF 30+ to sun-exposed areas, reapply every 2 hours as needed.  Recommend staying in the shade or wearing long sleeves, sun glasses (UVA+UVB protection) and wide brim hats (4-inch brim around the entire circumference of the hat). Call for new or changing lesions.   Cryotherapy Aftercare  Wash gently with soap and water everyday.   Apply Vaseline and Band-Aid daily until healed.     Due to recent changes in healthcare laws, you may see results of your pathology and/or laboratory studies on MyChart before the doctors have had a chance to review them. We understand that in some cases there may be results that are confusing or concerning to you. Please understand that not all results are received at the same time and often the doctors may need to interpret multiple results in order to provide you with the best plan of care or course of treatment. Therefore, we ask that you please give [place] 2 business days to thoroughly review all your results before contacting the office for clarification. Should we see a critical lab result, you will be contacted sooner.   If You Need Anything After Your Visit  If you have any questions or concerns for your doctor, please call our main line at [phone] and press option 4 to reach your doctor's medical assistant. If no one answers, please leave a voicemail as directed and we will return your call as soon as possible. Messages left after 4 pm will be answered the following business day.   You may also send [place] a message  via MyChart. We typically respond to MyChart messages within 1-2 business days.  For prescription refills, please ask your pharmacy to contact our office. Our fax number is [phone].  If you have an urgent issue when the clinic is closed that cannot wait until the next business day, you can page your doctor at the number below.    Please note that while we do our best to be available for urgent issues outside of office hours, we are not available 24/7.   If you have an urgent issue and are unable to reach [place], you may choose to seek medical care at your doctor's office, retail clinic, urgent care center, or emergency room.  If you have a medical emergency, please immediately call 911 or go to the emergency department.  Pager Numbers  - Dr. Gwen Pounds: [phone]  - Dr. Roseanne Reno: [phone]  - Dr. Katrinka Blazing: [phone]   In the event of inclement weather, please call our main line at [phone] for an update on the status of any delays or closures.  Dermatology Medication Tips: Please keep the boxes that topical medications come in in order to help keep track of the instructions about where and how to use these. Pharmacies typically print the medication instructions only on the boxes and not directly on the medication tubes.   If your medication is too expensive, please contact our office at [phone] option 4 or send [place] a message through MyChart.   We are unable to tell what your co-pay for medications will be in advance as  this is different depending on your insurance coverage. However, we may be able to find a substitute medication at lower cost or fill out paperwork to get insurance to cover a needed medication.   If a prior authorization is required to get your medication covered by your insurance company, please allow [place] 1-2 business days to complete this process.  Drug prices often vary depending on where the prescription is filled and some pharmacies may offer cheaper  prices.  The website www.goodrx.com contains coupons for medications through different pharmacies. The prices here do not account for what the cost may be with help from insurance (it may be cheaper with your insurance), but the website can give you the price if you did not use any insurance.  - You can print the associated coupon and take it with your prescription to the pharmacy.  - You may also stop by our office during regular business hours and pick up a GoodRx coupon card.  - If you need your prescription sent electronically to a different pharmacy, notify our office through [facility] or by phone at [phone] option 4.     Si Usted Necesita Algo Despus de Su Visita  Tambin puede enviarnos un mensaje a travs de Clinical cytogeneticist. Por lo general respondemos a los mensajes de MyChart en el transcurso de 1 a 2 das hbiles.  Para renovar recetas, por favor pida a su farmacia que se ponga en contacto con nuestra oficina. Annie Sable de fax es Bernice [phone].  Si tiene un asunto urgente cuando la clnica est cerrada y que no puede esperar hasta el siguiente da hbil, puede llamar/localizar a su doctor(a) al nmero que aparece a continuacin.   Por favor, tenga en cuenta que aunque hacemos todo lo posible para estar disponibles para asuntos urgentes fuera del horario de [place], no estamos disponibles las 24 horas del da, los 7 [address]  [address] de la [place].   Si tiene un problema urgente y no puede comunicarse con nosotros, puede optar por buscar atencin mdica  en el consultorio de su doctor(a), en una clnica privada, en un centro de atencin urgente o en una sala de emergencias.  Si tiene Engineer, drilling, por favor llame inmediatamente al 911 o vaya a la sala de emergencias.  Nmeros de bper  - Dr. Gwen Pounds: [phone]  - Dra. Roseanne Reno: [phone]  - Dr. Katrinka Blazing: [phone]   En caso de inclemencias del tiempo, por favor llame a Lacy Duverney principal al [phone]  para una actualizacin sobre el [place] de cualquier retraso o cierre.  Consejos para la medicacin en dermatologa: Por favor, guarde las cajas en las que vienen los medicamentos de uso tpico para ayudarle a seguir las instrucciones sobre dnde y cmo usarlos. Las farmacias generalmente imprimen las instrucciones del medicamento slo en las cajas y no directamente en los tubos del [place].   Si su medicamento es muy caro, por favor, pngase en contacto con Rolm Gala llamando al [phone] y presione la opcin 4 o envenos un mensaje a travs de Clinical cytogeneticist.   No podemos decirle cul ser su copago por los medicamentos por adelantado ya que esto es diferente dependiendo de la cobertura de su seguro. Sin embargo, es posible que podamos encontrar un medicamento sustituto a Audiological scientist un formulario para que el seguro cubra el medicamento que se considera necesario.   Si se requiere una autorizacin previa para que su compaa de seguros [place] su medicamento, por favor permtanos de 1 a 2 [address]  [address]  hbiles para completar este proceso.  Los precios de los medicamentos varan con frecuencia dependiendo del Environmental consultant de dnde se surte la receta y alguna farmacias pueden ofrecer precios ms baratos.  El sitio web www.goodrx.com tiene cupones para medicamentos de Health and safety inspector. Los precios aqu no tienen en cuenta lo que podra costar con la ayuda del seguro (puede ser ms barato con su seguro), pero el sitio web puede darle el precio si no utiliz Tourist information centre manager.  - Puede imprimir el cupn correspondiente y llevarlo con su receta a la farmacia.  - Tambin puede pasar por nuestra oficina durante el horario de atencin regular y Education officer, museum una tarjeta de cupones de GoodRx.  - Si necesita que su receta se enve electrnicamente a una farmacia diferente, informe a nuestra oficina a travs de MyChart de [place] o por telfono llamando al [phone] y presione la opcin 4.

## 2023-05-19 ENCOUNTER — Other Ambulatory Visit: Payer: Self-pay | Admitting: Dermatology

## 2023-07-14 ENCOUNTER — Ambulatory Visit: Payer: Medicare Other | Admitting: Dermatology

## 2023-07-28 DIAGNOSIS — M79641 Pain in right hand: Secondary | ICD-10-CM | POA: Diagnosis not present

## 2023-07-28 DIAGNOSIS — M66241 Spontaneous rupture of extensor tendons, right hand: Secondary | ICD-10-CM | POA: Diagnosis not present

## 2023-07-28 DIAGNOSIS — R2232 Localized swelling, mass and lump, left upper limb: Secondary | ICD-10-CM | POA: Diagnosis not present

## 2023-07-28 DIAGNOSIS — D219 Benign neoplasm of connective and other soft tissue, unspecified: Secondary | ICD-10-CM | POA: Diagnosis not present

## 2023-07-28 DIAGNOSIS — M720 Palmar fascial fibromatosis [Dupuytren]: Secondary | ICD-10-CM | POA: Diagnosis not present

## 2023-07-29 ENCOUNTER — Ambulatory Visit: Admitting: Dermatology

## 2023-07-29 ENCOUNTER — Encounter: Payer: Self-pay | Admitting: Dermatology

## 2023-07-29 DIAGNOSIS — L820 Inflamed seborrheic keratosis: Secondary | ICD-10-CM

## 2023-07-29 DIAGNOSIS — Z85828 Personal history of other malignant neoplasm of skin: Secondary | ICD-10-CM | POA: Diagnosis not present

## 2023-07-29 DIAGNOSIS — L814 Other melanin hyperpigmentation: Secondary | ICD-10-CM

## 2023-07-29 DIAGNOSIS — D492 Neoplasm of unspecified behavior of bone, soft tissue, and skin: Secondary | ICD-10-CM | POA: Diagnosis not present

## 2023-07-29 DIAGNOSIS — Z1283 Encounter for screening for malignant neoplasm of skin: Secondary | ICD-10-CM

## 2023-07-29 DIAGNOSIS — C44629 Squamous cell carcinoma of skin of left upper limb, including shoulder: Secondary | ICD-10-CM

## 2023-07-29 DIAGNOSIS — D1801 Hemangioma of skin and subcutaneous tissue: Secondary | ICD-10-CM

## 2023-07-29 DIAGNOSIS — L821 Other seborrheic keratosis: Secondary | ICD-10-CM | POA: Diagnosis not present

## 2023-07-29 DIAGNOSIS — D229 Melanocytic nevi, unspecified: Secondary | ICD-10-CM

## 2023-07-29 DIAGNOSIS — W908XXA Exposure to other nonionizing radiation, initial encounter: Secondary | ICD-10-CM | POA: Diagnosis not present

## 2023-07-29 DIAGNOSIS — L739 Follicular disorder, unspecified: Secondary | ICD-10-CM | POA: Diagnosis not present

## 2023-07-29 DIAGNOSIS — L570 Actinic keratosis: Secondary | ICD-10-CM | POA: Diagnosis not present

## 2023-07-29 DIAGNOSIS — L578 Other skin changes due to chronic exposure to nonionizing radiation: Secondary | ICD-10-CM | POA: Diagnosis not present

## 2023-07-29 MED ORDER — KETOCONAZOLE 2 % EX SHAM: MEDICATED_SHAMPOO | CUTANEOUS | 11 refills | Status: AC

## 2023-07-29 NOTE — Patient Instructions (Addendum)
 Cryotherapy Aftercare  Wash gently with soap and water everyday.   Apply Vaseline and Band-Aid daily until healed.     Wound Care Instructions  Cleanse wound gently with soap and water once a day then pat dry with clean gauze. Apply a thin coat of Petrolatum (petroleum jelly, "Vaseline") over the wound (unless you have an allergy to this). We recommend that you use a new, sterile tube of Vaseline. Do not pick or remove scabs. Do not remove the yellow or white "healing tissue" from the base of the wound.  Cover the wound with fresh, clean, nonstick gauze and secure with paper tape. You may use Band-Aids in place of gauze and tape if the wound is small enough, but would recommend trimming much of the tape off as there is often too much. Sometimes Band-Aids can irritate the skin.  You should call the office for your biopsy report after 1 week if you have not already been contacted.  If you experience any problems, such as abnormal amounts of bleeding, swelling, significant bruising, significant pain, or evidence of infection, please call the office immediately.  FOR ADULT SURGERY PATIENTS: If you need something for pain relief you may take 1 extra strength Tylenol  (acetaminophen ) AND 2 Ibuprofen (200mg  each) together every 4 hours as needed for pain. (do not take these if you are allergic to them or if you have a reason you should not take them.) Typically, you may only need pain medication for 1 to 3 days.   Scalp: Start Ketoconazole  2% shampoo 2-3 times per week lather on scalp, leave on 5-8 minutes, rinse out.  Can follow with your own conditioner.     Recommend daily broad spectrum sunscreen SPF 30+ to sun-exposed areas, reapply every 2 hours as needed. Call for new or changing lesions.  Staying in the shade or wearing long sleeves, sun glasses (UVA+UVB protection) and wide brim hats (4-inch brim around the entire circumference of the hat) are also recommended for sun protection.      Melanoma ABCDEs  Melanoma is the most dangerous type of skin cancer, and is the leading cause of death from skin disease.  You are more likely to develop melanoma if you: Have light-colored skin, light-colored eyes, or red or blond hair Spend a lot of time in the sun Tan regularly, either outdoors or in a tanning bed Have had blistering sunburns, especially during childhood Have a close family member who has had a melanoma Have atypical moles or large birthmarks  Early detection of melanoma is key since treatment is typically straightforward and cure rates are extremely high if we catch it early.   The first sign of melanoma is often a change in a mole or a new dark spot.  The ABCDE system is a way of remembering the signs of melanoma.  A for asymmetry:  The two halves do not match. B for border:  The edges of the growth are irregular. C for color:  A mixture of colors are present instead of an even brown color. D for diameter:  Melanomas are usually (but not always) greater than 6mm - the size of a pencil eraser. E for evolution:  The spot keeps changing in size, shape, and color.  Please check your skin once per month between visits. You can use a small mirror in front and a large mirror behind you to keep an eye on the back side or your body.   If you see any new or changing lesions  before your next follow-up, please call to schedule a visit.  Please continue daily skin protection including broad spectrum sunscreen SPF 30+ to sun-exposed areas, reapplying every 2 hours as needed when you're outdoors.   Staying in the shade or wearing long sleeves, sun glasses (UVA+UVB protection) and wide brim hats (4-inch brim around the entire circumference of the hat) are also recommended for sun protection.     Due to recent changes in healthcare laws, you may see results of your pathology and/or laboratory studies on MyChart before the doctors have had a chance to review them. We  understand that in some cases there may be results that are confusing or concerning to you. Please understand that not all results are received at the same time and often the doctors may need to interpret multiple results in order to provide you with the best plan of care or course of treatment. Therefore, we ask that you please give us  2 business days to thoroughly review all your results before contacting the office for clarification. Should we see a critical lab result, you will be contacted sooner.   If You Need Anything After Your Visit  If you have any questions or concerns for your doctor, please call our main line at [phone] and press option 4 to reach your doctor's medical assistant. If no one answers, please leave a voicemail as directed and we will return your call as soon as possible. Messages left after 4 pm will be answered the following business day.   You may also send us  a message via MyChart. We typically respond to MyChart messages within 1-2 business days.  For prescription refills, please ask your pharmacy to contact our office. Our fax number is [phone].  If you have an urgent issue when the clinic is closed that cannot wait until the next business day, you can page your doctor at the number below.    Please note that while we do our best to be available for urgent issues outside of office hours, we are not available 24/7.   If you have an urgent issue and are unable to reach us , you may choose to seek medical care at your doctor's office, retail clinic, urgent care center, or emergency room.  If you have a medical emergency, please immediately call 911 or go to the emergency department.  Pager Numbers  - Dr. Bary Likes: [phone]  - Dr. Annette Barters: [phone]  - Dr. Felipe Horton: [phone]   In the event of inclement weather, please call our main line at [phone] for an update on the status of any delays or closures.  Dermatology Medication Tips: Please  keep the boxes that topical medications come in in order to help keep track of the instructions about where and how to use these. Pharmacies typically print the medication instructions only on the boxes and not directly on the medication tubes.   If your medication is too expensive, please contact our office at [phone] option 4 or send us  a message through MyChart.   We are unable to tell what your co-pay for medications will be in advance as this is different depending on your insurance coverage. However, we may be able to find a substitute medication at lower cost or fill out paperwork to get insurance to cover a needed medication.   If a prior authorization is required to get your medication covered by your insurance company, please allow us  1-2 business days to complete this process.  Drug prices often vary depending on  where the prescription is filled and some pharmacies may offer cheaper prices.  The website www.goodrx.com contains coupons for medications through different pharmacies. The prices here do not account for what the cost may be with help from insurance (it may be cheaper with your insurance), but the website can give you the price if you did not use any insurance.  - You can print the associated coupon and take it with your prescription to the pharmacy.  - You may also stop by our office during regular business hours and pick up a GoodRx coupon card.  - If you need your prescription sent electronically to a different pharmacy, notify our office through [facility] or by phone at [phone] option 4.     Si Usted Necesita Algo Despus de Su Visita  Tambin puede enviarnos un mensaje a travs de Clinical cytogeneticist. Por lo general respondemos a los mensajes de MyChart en el transcurso de 1 a 2 das hbiles.  Para renovar recetas, por favor pida a su farmacia que se ponga en contacto con nuestra oficina. Franz Jacks de fax es [place] [phone].  Si tiene un asunto urgente  cuando la clnica est cerrada y que no puede esperar hasta el siguiente da hbil, puede llamar/localizar a su doctor(a) al nmero que aparece a continuacin.   Por favor, tenga en cuenta que aunque hacemos todo lo posible para estar disponibles para asuntos urgentes fuera del horario de [place], no estamos disponibles las 24 horas del da, los 7 [address]  [address] de la [place].   Si tiene un problema urgente y no puede comunicarse con nosotros, puede optar por buscar atencin mdica  en el consultorio de su doctor(a), en una clnica privada, en un centro de atencin urgente o en una sala de emergencias.  Si tiene Engineer, drilling, por favor llame inmediatamente al 911 o vaya a la sala de emergencias.  Nmeros de bper  - Dr. Bary Likes: [phone]  - Dra. Annette Barters: [phone]  - Dr. Felipe Horton: [phone]   En caso de inclemencias del tiempo, por favor llame a Lajuan Pila principal al [phone] para una actualizacin sobre el [place] de cualquier retraso o cierre.  Consejos para la medicacin en dermatologa: Por favor, guarde las cajas en las que vienen los medicamentos de uso tpico para ayudarle a seguir las instrucciones sobre dnde y cmo usarlos. Las farmacias generalmente imprimen las instrucciones del medicamento slo en las cajas y no directamente en los tubos del [place].   Si su medicamento es muy caro, por favor, pngase en contacto con Bettyjane Brunet llamando al [phone] y presione la opcin 4 o envenos un mensaje a travs de Clinical cytogeneticist.   No podemos decirle cul ser su copago por los medicamentos por adelantado ya que esto es diferente dependiendo de la cobertura de su seguro. Sin embargo, es posible que podamos encontrar un medicamento sustituto a Audiological scientist un formulario para que el seguro cubra el medicamento que se considera necesario.   Si se requiere una autorizacin previa para que su compaa de seguros [place] su medicamento, por favor permtanos de 1 a 2  das hbiles para completar este proceso.  Los precios de los medicamentos varan con frecuencia dependiendo del Environmental consultant de dnde se surte la receta y alguna farmacias pueden ofrecer precios ms baratos.  El sitio web www.goodrx.com tiene cupones para medicamentos de Health and safety inspector. Los precios aqu no tienen en cuenta lo que podra costar con la ayuda del seguro (puede ser ms barato con su seguro), [employer]  el sitio web puede darle el precio si no Visual merchandiser.  - Puede imprimir el cupn correspondiente y llevarlo con su receta a la farmacia.  - Tambin puede pasar por nuestra oficina durante el horario de atencin regular y Education officer, museum una tarjeta de cupones de GoodRx.  - Si necesita que su receta se enve electrnicamente a una farmacia diferente, informe a nuestra oficina a travs de MyChart de [place] o por telfono llamando al [phone] y presione la opcin 4.

## 2023-07-29 NOTE — Progress Notes (Signed)
 Follow-Up Visit   Subjective  Julie Rivera is a 78 y.o. female who presents for the following: Skin Cancer Screening and Full Body Skin Exam. Hx of BCCs. Hx of SCCs. Hx of AKs    Area of concern at left dorsal hand. Raised, tender if touched. Dur: few months. Also bumps in scalp she picks at.  The patient presents for Total-Body Skin Exam (TBSE) for skin cancer screening and mole check. The patient has spots, moles and lesions to be evaluated, some may be new or changing and the patient may have concern these could be cancer.    The following portions of the chart were reviewed this encounter and updated as appropriate: medications, allergies, medical history  Review of Systems:  No other skin or systemic complaints except as noted in HPI or Assessment and Plan.  Objective  Well appearing patient in no apparent distress; mood and affect are within normal limits.  A full examination was performed including scalp, head, eyes, ears, nose, lips, neck, chest, axillae, abdomen, back, buttocks, bilateral upper extremities, bilateral lower extremities, hands, feet, fingers, toes, fingernails, and toenails. All findings within normal limits unless otherwise noted below.   Relevant physical exam findings are noted in the Assessment and Plan.  Left Dorsal Hand 1.6 cm erythematous firm scaly nodule  Right Inferior Tip of Nose x1,  R anterior alar crease x1 (2) Erythematous thin papules/macules with gritty scale.  Left Elbow - Posterior x1, R upper antecubitum x2, L popliteal x1, L calf x1 (ISK) (5) Erythematous keratotic or waxy stuck-on papule.  Pink gray-brown keratotic macules at elbows and popliteal   Assessment & Plan   SKIN CANCER SCREENING PERFORMED TODAY.  ACTINIC DAMAGE - Chronic condition, secondary to cumulative UV/sun exposure - diffuse scaly erythematous macules with underlying dyspigmentation - Recommend daily broad spectrum sunscreen SPF 30+ to sun-exposed areas,  reapply every 2 hours as needed.  - Staying in the shade or wearing long sleeves, sun glasses (UVA+UVB protection) and wide brim hats (4-inch brim around the entire circumference of the hat) are also recommended for sun protection.  - Call for new or changing lesions.  LENTIGINES, SEBORRHEIC KERATOSES, HEMANGIOMAS - Benign normal skin lesions - Benign-appearing - Call for any changes  MELANOCYTIC NEVI - Tan-brown and/or pink-flesh-colored symmetric macules and papules - Benign appearing on exam today - Observation - Call clinic for new or changing moles - Recommend daily use of broad spectrum spf 30+ sunscreen to sun-exposed areas.    FOLLICULITIS Exam: Perifollicular erythematous crusted papules at crown scalp  Chronic and persistent condition with duration or expected duration over one year. Condition is bothersome/symptomatic for patient. Currently flared.   Folliculitis occurs due to inflammation of the superficial hair follicle (pore), resulting in acne-like lesions (pus bumps). It can be infectious (bacterial, fungal) or noninfectious (shaving, tight clothing, heat/sweat, medications).  Folliculitis can be acute or chronic and recommended treatment depends on the underlying cause of folliculitis.  Treatment Plan:  Start Ketoconazole  2% shampoo 2-3 times per week lather on scalp, leave on 5-8 minutes, rinse out.   NEOPLASM OF SKIN Left Dorsal Hand Epidermal / dermal shaving  Lesion diameter (cm):  1.6 Informed consent: discussed and consent obtained   Patient was prepped and draped in usual sterile fashion: Area prepped with alcohol. Anesthesia: the lesion was anesthetized in a standard fashion   Anesthetic:  1% lidocaine  w/ epinephrine  1-100,000 buffered w/ 8.4% NaHCO3 Instrument used: flexible razor blade   Hemostasis achieved with: pressure, aluminum  chloride and electrodesiccation   Outcome: patient tolerated procedure well    Destruction of lesion  Destruction  method: electrodesiccation and curettage   Informed consent: discussed and consent obtained   Curettage performed in three different directions: Yes   Electrodesiccation performed over the curetted area: Yes   Final wound size (cm):  1.6 Hemostasis achieved with:  pressure, aluminum chloride and electrodesiccation Outcome: patient tolerated procedure well with no complications   Post-procedure details: wound care instructions given   Additional details:  Mupirocin ointment and Bandaid applied  Specimen 1 - Surgical pathology Differential Diagnosis: SCC   Check Margins: No   EDC today AK (ACTINIC KERATOSIS) (2) Right Inferior Tip of Nose x1,  R anterior alar crease x1 (2) Actinic keratoses are precancerous spots that appear secondary to cumulative UV radiation exposure/sun exposure over time. They are chronic with expected duration over 1 year. A portion of actinic keratoses will progress to squamous cell carcinoma of the skin. It is not possible to reliably predict which spots will progress to skin cancer and so treatment is recommended to prevent development of skin cancer.  Recommend daily broad spectrum sunscreen SPF 30+ to sun-exposed areas, reapply every 2 hours as needed.  Recommend staying in the shade or wearing long sleeves, sun glasses (UVA+UVB protection) and wide brim hats (4-inch brim around the entire circumference of the hat). Call for new or changing lesions. Destruction of lesion - Right Inferior Tip of Nose x1,  R anterior alar crease x1 (2)  Destruction method: cryotherapy   Informed consent: discussed and consent obtained   Lesion destroyed using liquid nitrogen: Yes   Region frozen until ice ball extended beyond lesion: Yes   Outcome: patient tolerated procedure well with no complications   Post-procedure details: wound care instructions given   Additional details:  Prior to procedure, discussed risks of blister formation, small wound, skin dyspigmentation, or rare  scar following cryotherapy. Recommend Vaseline ointment to treated areas while healing.  INFLAMED SEBORRHEIC KERATOSIS (5) Left Elbow - Posterior x1, R upper antecubitum x2, L popliteal x1, L calf x1 (ISK) (5) Vs pigmented Aks, recheck on f/up  Symptomatic, irritating, patient would like treated. Destruction of lesion - Left Elbow - Posterior x1, R upper antecubitum x2, L popliteal x1, L calf x1 (ISK) (5)  Destruction method: cryotherapy   Informed consent: discussed and consent obtained   Lesion destroyed using liquid nitrogen: Yes   Region frozen until ice ball extended beyond lesion: Yes   Outcome: patient tolerated procedure well with no complications   Post-procedure details: wound care instructions given   Additional details:  Prior to procedure, discussed risks of blister formation, small wound, skin dyspigmentation, or rare scar following cryotherapy. Recommend Vaseline ointment to treated areas while healing.  Return in about 3 months (around 10/28/2023) for AK Follow Up, Biopsy Follow Up.  I, Darcie Easterly, CMA, am acting as scribe for Artemio Larry, MD.   Documentation: I have reviewed the above documentation for accuracy and completeness, and I agree with the above.  Artemio Larry, MD

## 2023-08-04 LAB — SURGICAL PATHOLOGY

## 2023-08-05 ENCOUNTER — Telehealth: Payer: Self-pay

## 2023-08-05 NOTE — Telephone Encounter (Signed)
 Advised pt of bx results/sh ?

## 2023-08-05 NOTE — Telephone Encounter (Signed)
-----   Message from Artemio Larry sent at 08/04/2023  8:47 PM EDT ----- 1. Skin, left dorsal hand :       WELL DIFFERENTIATED SQUAMOUS CELL CARCINOMA   SCC skin cancer- already treated with EDC at time of biopsy   - please call patient

## 2023-09-13 ENCOUNTER — Other Ambulatory Visit: Payer: Self-pay | Admitting: Dermatology

## 2023-10-21 ENCOUNTER — Ambulatory Visit: Admitting: Dermatology

## 2023-11-04 ENCOUNTER — Ambulatory Visit: Admitting: Dermatology

## 2023-11-04 ENCOUNTER — Encounter: Payer: Self-pay | Admitting: Dermatology

## 2023-11-04 DIAGNOSIS — L821 Other seborrheic keratosis: Secondary | ICD-10-CM | POA: Diagnosis not present

## 2023-11-04 DIAGNOSIS — W908XXA Exposure to other nonionizing radiation, initial encounter: Secondary | ICD-10-CM | POA: Diagnosis not present

## 2023-11-04 DIAGNOSIS — L570 Actinic keratosis: Secondary | ICD-10-CM | POA: Diagnosis not present

## 2023-11-04 DIAGNOSIS — Z85828 Personal history of other malignant neoplasm of skin: Secondary | ICD-10-CM | POA: Diagnosis not present

## 2023-11-04 DIAGNOSIS — L578 Other skin changes due to chronic exposure to nonionizing radiation: Secondary | ICD-10-CM | POA: Diagnosis not present

## 2023-11-04 DIAGNOSIS — L739 Follicular disorder, unspecified: Secondary | ICD-10-CM

## 2023-11-04 NOTE — Progress Notes (Signed)
 Follow-Up Visit   Subjective  Julie Rivera is a 78 y.o. female who presents for the following: 3 month AK follow up. Right inferior tip of nose. Right anterior alar crease. Tx with LN2 07/29/2023.  Recheck SCC. Left dorsal hand. Bx/EDC 07/29/2023   The following portions of the chart were reviewed this encounter and updated as appropriate: medications, allergies, medical history  Review of Systems:  No other skin or systemic complaints except as noted in HPI or Assessment and Plan.  Objective  Well appearing patient in no apparent distress; mood and affect are within normal limits.  A focused examination was performed of the following areas: Face, left hand  Relevant exam findings are noted in the Assessment and Plan.  R nasal tip x1, R dorsal hand x2 (hypertrophic), L forearm x1, L dorsal hand x2 (6) Erythematous thin papules/macules with gritty scale. Hyperkeratotic erythematous papules at R dorsal hand.   Assessment & Plan   ACTINIC DAMAGE - chronic, secondary to cumulative UV radiation exposure/sun exposure over time - diffuse scaly erythematous macules with underlying dyspigmentation - Recommend daily broad spectrum sunscreen SPF 30+ to sun-exposed areas, reapply every 2 hours as needed.  - Recommend staying in the shade or wearing long sleeves, sun glasses (UVA+UVB protection) and wide brim hats (4-inch brim around the entire circumference of the hat). - Call for new or changing lesions.   HISTORY OF SQUAMOUS CELL CARCINOMA OF THE SKIN. Left dorsal hand. EDC 07/29/2023. - No evidence of recurrence today - Recommend regular full body skin exams - Recommend daily broad spectrum sunscreen SPF 30+ to sun-exposed areas, reapply every 2 hours as needed.  - Call if any new or changing lesions are noted between office visits  HISTORY OF BASAL CELL CARCINOMA OF THE SKIN - No evidence of recurrence today - Recommend regular full body skin exams - Recommend daily broad spectrum  sunscreen SPF 30+ to sun-exposed areas, reapply every 2 hours as needed.  - Call if any new or changing lesions are noted between office visits  FOLLICULITIS, resolved Exam: crown scalp clear today  Folliculitis occurs due to inflammation of the superficial hair follicle (pore), resulting in acne-like lesions (pus bumps). It can be infectious (bacterial, fungal) or noninfectious (shaving, tight clothing, heat/sweat, medications).  Folliculitis can be acute or chronic and recommended treatment depends on the underlying cause of folliculitis.  Treatment Plan: Use Ketoconazole  2% shampoo as directed/as needed for flares.   SEBORRHEIC KERATOSIS - Stuck-on, waxy, tan-brown papule at scalp - Benign-appearing - Discussed benign etiology and prognosis. - Observe - Call for any changes  AK (ACTINIC KERATOSIS) (6) R nasal tip x1, R dorsal hand x2 (hypertrophic), L forearm x1, L dorsal hand x2 (6) Hypertrophic AKs at R dorsal hand.  Actinic keratoses are precancerous spots that appear secondary to cumulative UV radiation exposure/sun exposure over time. They are chronic with expected duration over 1 year. A portion of actinic keratoses will progress to squamous cell carcinoma of the skin. It is not possible to reliably predict which spots will progress to skin cancer and so treatment is recommended to prevent development of skin cancer.  Recommend daily broad spectrum sunscreen SPF 30+ to sun-exposed areas, reapply every 2 hours as needed.  Recommend staying in the shade or wearing long sleeves, sun glasses (UVA+UVB protection) and wide brim hats (4-inch brim around the entire circumference of the hat). Call for new or changing lesions. Destruction of lesion - R nasal tip x1, R dorsal hand x2 (  hypertrophic), L forearm x1, L dorsal hand x2 (6)  Destruction method: cryotherapy   Informed consent: discussed and consent obtained   Lesion destroyed using liquid nitrogen: Yes   Region frozen until ice  ball extended beyond lesion: Yes   Outcome: patient tolerated procedure well with no complications   Post-procedure details: wound care instructions given   Additional details:  Prior to procedure, discussed risks of blister formation, small wound, skin dyspigmentation, or rare scar following cryotherapy. Recommend Vaseline ointment to treated areas while healing.    Return in about 9 months (around 08/04/2024) for TBSE, HxBCCs, HxSCCs, Botox in September .  I, Jill Parcell, CMA, am acting as scribe for Rexene Rattler, MD.   Documentation: I have reviewed the above documentation for accuracy and completeness, and I agree with the above.  Rexene Rattler, MD

## 2023-11-04 NOTE — Patient Instructions (Addendum)
 Cryotherapy Aftercare  Wash gently with soap and water everyday.   Apply Vaseline Jelly daily until healed.     Recommend daily broad spectrum sunscreen SPF 30+ to sun-exposed areas, reapply every 2 hours as needed. Call for new or changing lesions.  Staying in the shade or wearing long sleeves, sun glasses (UVA+UVB protection) and wide brim hats (4-inch brim around the entire circumference of the hat) are also recommended for sun protection.      Due to recent changes in healthcare laws, you may see results of your pathology and/or laboratory studies on MyChart before the doctors have had a chance to review them. We understand that in some cases there may be results that are confusing or concerning to you. Please understand that not all results are received at the same time and often the doctors may need to interpret multiple results in order to provide you with the best plan of care or course of treatment. Therefore, we ask that you please give [place] 2 business days to thoroughly review all your results before contacting the office for clarification. Should we see a critical lab result, you will be contacted sooner.   If You Need Anything After Your Visit  If you have any questions or concerns for your doctor, please call our main line at [phone] and press option 4 to reach your doctor's medical assistant. If no one answers, please leave a voicemail as directed and we will return your call as soon as possible. Messages left after 4 pm will be answered the following business day.   You may also send [place] a message via MyChart. We typically respond to MyChart messages within 1-2 business days.  For prescription refills, please ask your pharmacy to contact our office. Our fax number is [phone].  If you have an urgent issue when the clinic is closed that cannot wait until the next business day, you can page your doctor at the number below.    Please note that while we do our best to be  available for urgent issues outside of office hours, we are not available 24/7.   If you have an urgent issue and are unable to reach [place], you may choose to seek medical care at your doctor's office, retail clinic, urgent care center, or emergency room.  If you have a medical emergency, please immediately call 911 or go to the emergency department.  Pager Numbers  - Dr. Gwen Pounds: [phone]  - Dr. Roseanne Reno: [phone]  - Dr. Katrinka Blazing: [phone]   In the event of inclement weather, please call our main line at [phone] for an update on the status of any delays or closures.  Dermatology Medication Tips: Please keep the boxes that topical medications come in in order to help keep track of the instructions about where and how to use these. Pharmacies typically print the medication instructions only on the boxes and not directly on the medication tubes.   If your medication is too expensive, please contact our office at [phone] option 4 or send [place] a message through MyChart.   We are unable to tell what your co-pay for medications will be in advance as this is different depending on your insurance coverage. However, we may be able to find a substitute medication at lower cost or fill out paperwork to get insurance to cover a needed medication.   If a prior authorization is required to get your medication covered by your insurance company, please allow [place] 1-2 business days to complete  this process.  Drug prices often vary depending on where the prescription is filled and some pharmacies may offer cheaper prices.  The website www.goodrx.com contains coupons for medications through different pharmacies. The prices here do not account for what the cost may be with help from insurance (it may be cheaper with your insurance), but the website can give you the price if you did not use any insurance.  - You can print the associated coupon and take it with your prescription to the pharmacy.   - You may also stop by our office during regular business hours and pick up a GoodRx coupon card.  - If you need your prescription sent electronically to a different pharmacy, notify our office through [facility] or by phone at [phone] option 4.     Si Usted Necesita Algo Despus de Su Visita  Tambin puede enviarnos un mensaje a travs de Clinical cytogeneticist. Por lo general respondemos a los mensajes de MyChart en el transcurso de 1 a 2 das hbiles.  Para renovar recetas, por favor pida a su farmacia que se ponga en contacto con nuestra oficina. Annie Sable de fax es [place] [phone].  Si tiene un asunto urgente cuando la clnica est cerrada y que no puede esperar hasta el siguiente da hbil, puede llamar/localizar a su doctor(a) al nmero que aparece a continuacin.   Por favor, tenga en cuenta que aunque hacemos todo lo posible para estar disponibles para asuntos urgentes fuera del horario de [place], no estamos disponibles las 24 horas del da, los 7 [address]  [address] de la [place].   Si tiene un problema urgente y no puede comunicarse con nosotros, puede optar por buscar atencin mdica  en el consultorio de su doctor(a), en una clnica privada, en un centro de atencin urgente o en una sala de emergencias.  Si tiene Engineer, drilling, por favor llame inmediatamente al 911 o vaya a la sala de emergencias.  Nmeros de bper  - Dr. Gwen Pounds: [phone]  - Dra. Roseanne Reno: [phone]  - Dr. Katrinka Blazing: [phone]   En caso de inclemencias del tiempo, por favor llame a Lacy Duverney principal al [phone] para una actualizacin sobre el [place] de cualquier retraso o cierre.  Consejos para la medicacin en dermatologa: Por favor, guarde las cajas en las que vienen los medicamentos de uso tpico para ayudarle a seguir las instrucciones sobre dnde y cmo usarlos. Las farmacias generalmente imprimen las instrucciones del medicamento slo en las cajas y no directamente en los tubos del  [place].   Si su medicamento es muy caro, por favor, pngase en contacto con Rolm Gala llamando al [phone] y presione la opcin 4 o envenos un mensaje a travs de Clinical cytogeneticist.   No podemos decirle cul ser su copago por los medicamentos por adelantado ya que esto es diferente dependiendo de la cobertura de su seguro. Sin embargo, es posible que podamos encontrar un medicamento sustituto a Audiological scientist un formulario para que el seguro cubra el medicamento que se considera necesario.   Si se requiere una autorizacin previa para que su compaa de seguros [place] su medicamento, por favor permtanos de 1 a 2 das hbiles para completar [address]  Los precios de los medicamentos varan con frecuencia dependiendo del Environmental consultant de dnde se surte la receta y alguna farmacias pueden ofrecer precios ms baratos.  El sitio web www.goodrx.com tiene cupones para medicamentos de Health and safety inspector. Los precios aqu no tienen en cuenta lo que podra costar con la ayuda del  seguro (puede ser ms barato con su seguro), pero el sitio web puede darle el precio si no Visual merchandiser.  - Puede imprimir el cupn correspondiente y llevarlo con su receta a la farmacia.  - Tambin puede pasar por nuestra oficina durante el horario de atencin regular y Education officer, museum una tarjeta de cupones de GoodRx.  - Si necesita que su receta se enve electrnicamente a una farmacia diferente, informe a nuestra oficina a travs de MyChart de [place] o por telfono llamando al [phone] y presione la opcin 4.

## 2023-12-09 DIAGNOSIS — N812 Incomplete uterovaginal prolapse: Secondary | ICD-10-CM | POA: Diagnosis not present

## 2023-12-09 DIAGNOSIS — Z4689 Encounter for fitting and adjustment of other specified devices: Secondary | ICD-10-CM | POA: Diagnosis not present

## 2023-12-09 DIAGNOSIS — N8111 Cystocele, midline: Secondary | ICD-10-CM | POA: Diagnosis not present

## 2023-12-16 ENCOUNTER — Other Ambulatory Visit: Payer: Self-pay | Admitting: Dermatology

## 2023-12-29 DIAGNOSIS — Z1231 Encounter for screening mammogram for malignant neoplasm of breast: Secondary | ICD-10-CM | POA: Diagnosis not present

## 2024-01-05 ENCOUNTER — Encounter: Payer: Self-pay | Admitting: Hematology and Oncology

## 2024-01-05 ENCOUNTER — Ambulatory Visit (INDEPENDENT_AMBULATORY_CARE_PROVIDER_SITE_OTHER): Payer: Self-pay | Admitting: Dermatology

## 2024-01-05 ENCOUNTER — Encounter: Payer: Self-pay | Admitting: Dermatology

## 2024-01-05 DIAGNOSIS — L988 Other specified disorders of the skin and subcutaneous tissue: Secondary | ICD-10-CM

## 2024-01-05 NOTE — Progress Notes (Signed)
   Follow-Up Visit   Subjective  Julie Rivera is a 78 y.o. female who presents for the following: Botox for facial elastosis  The following portions of the chart were reviewed this encounter and updated as appropriate: medications, allergies, medical history  Review of Systems:  No other skin or systemic complaints except as noted in HPI or Assessment and Plan.  Objective  Well appearing patient in no apparent distress; mood and affect are within normal limits.  A focused examination was performed of the face.  Relevant physical exam findings are noted in the Assessment and Plan.  Injection map photo     Assessment & Plan    Facial Elastosis Botox 25 units injected today to: - Frown complex 25 units  Location: frown complex  Informed consent: Discussed risks (infection, pain, bleeding, bruising, swelling, allergic reaction, paralysis of nearby muscles, eyelid droop, double vision, neck weakness, difficulty breathing, headache, undesirable cosmetic result, and need for additional treatment) and benefits of the procedure, as well as the alternatives.  Informed consent was obtained.  Preparation: The area was cleansed with alcohol.  Procedure Details:  Botox was injected into the dermis with a 30-gauge needle. Pressure applied to any bleeding. Ice packs offered for swelling.  Lot Number:  [id number] Expiration:  01/2026  Total Units Injected:  25  Plan: Tylenol  may be used for headache.  Allow 2 weeks before returning to clinic for additional dosing as needed. Patient will call for any problems.   Return for 3-85m Botox.  I, Grayce Saunas, RMA, am acting as scribe for Rexene Rattler, MD .'   Documentation: I have reviewed the above documentation for accuracy and completeness, and I agree with the above.  Rexene Rattler, MD

## 2024-01-05 NOTE — Patient Instructions (Signed)

## 2024-02-17 ENCOUNTER — Encounter

## 2024-02-23 ENCOUNTER — Inpatient Hospital Stay: Payer: Medicare Other | Attending: Hematology and Oncology | Admitting: Hematology and Oncology

## 2024-02-23 DIAGNOSIS — Z170 Estrogen receptor positive status [ER+]: Secondary | ICD-10-CM

## 2024-02-23 DIAGNOSIS — C50412 Malignant neoplasm of upper-outer quadrant of left female breast: Secondary | ICD-10-CM | POA: Diagnosis not present

## 2024-02-23 NOTE — Progress Notes (Signed)
 HEMATOLOGY-ONCOLOGY TELEPHONE VISIT PROGRESS NOTE  I connected with our patient on 02/23/24 at  8:30 AM EST by telephone and verified that I am speaking with the correct person using two identifiers.  I discussed the limitations, risks, security and privacy concerns of performing an evaluation and management service by telephone and the availability of in person appointments.  I also discussed with the patient that there may be a patient responsible charge related to this service. The patient expressed understanding and agreed to proceed.   History of Present Illness: Telephone follow-up to discuss her mammogram results  History of Present Illness Julie Rivera is a 78 year old female with a history of breast cancer who presents for follow-up regarding lung nodules and breast cancer surveillance.  She undergoes routine surveillance for breast cancer and had a mammogram in September, which showed denser breast tissue without abnormal findings. She is considering a new blood test for detecting breast cancer recurrence.  She has been monitoring lung nodules for six to seven years, with a slight increase in size on the last CT scan. She has an upcoming appointment with a new pulmonary physician. She is concerned about frequent CT scans due to radiation exposure and questions the accuracy of biopsies for small nodules. She experiences morning mucus production without cold or sinus drainage, questioning its relation to her lung condition.  Her brother-in-law has melanoma and undergoes frequent scans, and a friend's metastatic breast cancer diagnosis influences her consideration of surveillance options.    Oncology History  Malignant neoplasm of upper-outer quadrant of left breast in female, estrogen receptor positive (HCC)  05/21/2019 Initial Diagnosis   Screening mammogram detected an asymmetry and calcifications 4.5 cm in the left breast. Diagnostic mammogram showed a 1.9cm mass and  calcifications in the upper outer left breast.  Biopsy revealed grade 2 IDC ER/PR positive HER-2 negative with a Ki-67 of 5%   07/29/2019 Surgery   Left lumpectomy [facility]) ([id number]): IDC, grade 1, 2.4cm, with low grade DCIS, 2 left axillary lymph nodes negative. Broadly present at the anterior margin of specimen B and focally present at the inferior margin of specimen B. Focally present at the lateral margin of specimen B.   07/29/2019 Cancer Staging   Staging form: Breast, AJCC 8th Edition - Pathologic stage from 07/29/2019: Stage IA (pT2, pN0, cM0, G1, ER+, PR+, HER2-)   08/25/2019 - 09/23/2019 Radiation Therapy   The patient initially received a dose of 40.05 Gy in 15 fractions to the breast using whole-breast tangent fields. This was delivered using a 3-D conformal technique. The pt received a boost delivering an additional 12 Gy in 6 fractions using a electron boost with electrons. The total dose was 52.05 Gy.   10/2019 -  Anti-estrogen oral therapy   Anastrozole , discontinued 12/31/19 due to insomnia, hot flashes, and joint pain; switched to tamoxifen  12/31/19     REVIEW OF SYSTEMS:   Constitutional: Denies fevers, chills or abnormal weight loss All other systems were reviewed with the patient and are negative. Observations/Objective:     Assessment Plan:  Malignant neoplasm of upper-outer quadrant of left breast in female, estrogen receptor positive (HCC) 05/21/2019: Screening mammogram detected an asymmetry and calcifications in the left breast calcifications span 4.5 cm the palpable lump inside of that measured 1.7 cm. Diagnostic mammogram showed a 1.9cm mass and calcifications in the upper outer left breast.  Biopsy revealed grade 2 invasive ductal carcinoma ER/PR positive HER-2 negative with a Ki-67 of 15%  07/29/2019:Left lumpectomy ([facility]): IDC, grade 1, 2.4cm, with low grade DCIS, 2 left axillary lymph nodes negative.  ER/PR positive HER-2 negative with a Ki-67 of 15%  positive anterior and inferior margins   Treatment plan: Resection of the positive margins 1.  Adjuvant radiation therapy 08/26/19-09/17/19 2.  Followed by adjuvant antiestrogen therapy: anastrozole  1 mg daily  switched to letrozole  stopped 12/19/19 changed to Tamoxifen  12/31/19 due to hot flashes and insomnia and knee pain and fatigue, patient could not tolerate even 10 mg of tamoxifen .  Antiestrogen therapy discontinued  2021   Severe hot flashes: off Ambien  regularly.Slightly better.   Breast cancer surveillance:  Mammogram 12/29/23: [facility]: Benign, breast density category C Recommended guardant reveal for MRD monitoring   Bone density 10/14/2019: T score -1.8  PET CT scan 03/27/2022: Multiple mildly enlarged and hypermetabolic mediastinal and hilar lymph nodes are nonspecific. Alternative diagnoses like sarcoidosis or inflammatory process should be considered. Multiple small nodules bilaterally nonspecific.  CT chest 03/23/2023: Continued slow growth of innumerable solid nodules in both lungs primarily subpleural and peribronchovascular distribution.  No change in the mediastinal and hilar lymph nodes  Patient will be seeing pulmonary to discuss the role of scans for monitoring.  I assured her that once a year CT scans is not significantly harmful.  She wants to discuss about getting scans every other year.  I recommended guardant reveal for MRD monitoring. Telephone visit in 1 year to discuss some of these tests and results.  Assessment & Plan History of estrogen receptor positive left breast cancer, surveillance and recurrence monitoring Mammogram showed no concerning findings. Guardant test can detect circulating cancer DNA before recurrence. - Ordered Guardant blood test for circulating cancer DNA. - Continue annual mammograms.  Stable pulmonary nodules, ongoing surveillance Pulmonary nodules stable with slight size increase. Biopsy accuracy low due to size. Differential includes  sarcoidosis. - Proceed with annual CT scan for pulmonary nodules. - Discuss with pulmonologist about potential for less frequent CT scans.  Morning productive cough, evaluation for pulmonary or sinus etiology Cough likely due to sinus drainage, possible pulmonary origin. - Continue to monitor symptoms and consider further evaluation if symptoms persist or worsen.      I discussed the assessment and treatment plan with the patient. The patient was provided an opportunity to ask questions and all were answered. The patient agreed with the plan and demonstrated an understanding of the instructions. The patient was advised to call back or seek an in-person evaluation if the symptoms worsen or if the condition fails to improve as anticipated.   I provided 20 minutes of non-face-to-face time during this encounter.  This includes time for charting and coordination of care   Naomi MARLA Chad, MD

## 2024-02-23 NOTE — Assessment & Plan Note (Addendum)
 05/21/2019: Screening mammogram detected an asymmetry and calcifications in the left breast calcifications span 4.5 cm the palpable lump inside of that measured 1.7 cm. Diagnostic mammogram showed a 1.9cm mass and calcifications in the upper outer left breast.  Biopsy revealed grade 2 invasive ductal carcinoma ER/PR positive HER-2 negative with a Ki-67 of 15%   07/29/2019:Left lumpectomy ([facility]): IDC, grade 1, 2.4cm, with low grade DCIS, 2 left axillary lymph nodes negative.  ER/PR positive HER-2 negative with a Ki-67 of 15% positive anterior and inferior margins   Treatment plan: Resection of the positive margins 1.  Adjuvant radiation therapy 08/26/19-09/17/19 2.  Followed by adjuvant antiestrogen therapy: anastrozole  1 mg daily  switched to letrozole  stopped 12/19/19 changed to Tamoxifen  12/31/19 due to hot flashes and insomnia and knee pain and fatigue, patient could not tolerate even 10 mg of tamoxifen .  Antiestrogen therapy discontinued  2021   Severe hot flashes: off Ambien  regularly.Slightly better.   Breast cancer surveillance:  Mammogram 10/17/2022: [facility]: Benign, breast density category C Breast exam 02/23/2024: Benign Recommended guardant reveal for MRD monitoring   Bone density 10/14/2019: T score -1.8  PET CT scan 03/27/2022: Multiple mildly enlarged and hypermetabolic mediastinal and hilar lymph nodes are nonspecific. Alternative diagnoses like sarcoidosis or inflammatory process should be considered. Multiple small nodules bilaterally nonspecific.  CT chest 03/23/2023: Continued slow growth of innumerable solid nodules in both lungs primarily subpleural and peribronchovascular distribution.  No change in the mediastinal and hilar lymph nodes  I recommended continued monitoring with another CT scan to be done in December.

## 2024-02-24 ENCOUNTER — Encounter

## 2024-03-08 ENCOUNTER — Ambulatory Visit
Admission: RE | Admit: 2024-03-08 | Discharge: 2024-03-08 | Disposition: A | Source: Ambulatory Visit | Attending: Nurse Practitioner

## 2024-03-08 DIAGNOSIS — R918 Other nonspecific abnormal finding of lung field: Secondary | ICD-10-CM | POA: Diagnosis not present

## 2024-03-08 DIAGNOSIS — R591 Generalized enlarged lymph nodes: Secondary | ICD-10-CM | POA: Diagnosis not present

## 2024-03-08 DIAGNOSIS — R599 Enlarged lymph nodes, unspecified: Secondary | ICD-10-CM | POA: Diagnosis not present

## 2024-03-08 MED ORDER — IOHEXOL 300 MG/ML  SOLN
75.0000 mL | Freq: Once | INTRAMUSCULAR | Status: AC | PRN
  Administered 2024-03-08: 75 mL via INTRAVENOUS

## 2024-03-12 ENCOUNTER — Ambulatory Visit: Payer: Self-pay | Admitting: Nurse Practitioner

## 2024-03-16 ENCOUNTER — Other Ambulatory Visit: Payer: Self-pay | Admitting: Dermatology

## 2024-04-12 ENCOUNTER — Ambulatory Visit (INDEPENDENT_AMBULATORY_CARE_PROVIDER_SITE_OTHER): Payer: Self-pay | Admitting: Dermatology

## 2024-04-12 ENCOUNTER — Encounter: Payer: Self-pay | Admitting: Dermatology

## 2024-04-12 DIAGNOSIS — L988 Other specified disorders of the skin and subcutaneous tissue: Secondary | ICD-10-CM

## 2024-04-12 NOTE — Progress Notes (Signed)
" ° °  Follow-Up Visit   Subjective  Julie Rivera is a 79 y.o. female who presents for the following: Botox for facial elastosis  The following portions of the chart were reviewed this encounter and updated as appropriate: medications, allergies, medical history  Review of Systems:  No other skin or systemic complaints except as noted in HPI or Assessment and Plan.  Objective  Well appearing patient in no apparent distress; mood and affect are within normal limits.  A focused examination was performed of the face.  Relevant physical exam findings are noted in the Assessment and Plan.  Injection map photo     Assessment & Plan    Facial Elastosis Botox 25 units injected today to: - Frown complex 25 units Note- may increase from 5 to 7.5 units at f/up to nasal root Location: frown complex  Informed consent: Discussed risks (infection, pain, bleeding, bruising, swelling, allergic reaction, paralysis of nearby muscles, eyelid droop, double vision, neck weakness, difficulty breathing, headache, undesirable cosmetic result, and need for additional treatment) and benefits of the procedure, as well as the alternatives.  Informed consent was obtained.  Preparation: The area was cleansed with alcohol.  Procedure Details:  Botox was injected into the dermis with a 30-gauge needle. Pressure applied to any bleeding. Ice packs offered for swelling.  Lot Number:  [id number] Expiration:  02/2026  Total Units Injected:  25  Plan: Tylenol  may be used for headache.  Allow 2 weeks before returning to clinic for additional dosing as needed. Patient will call for any problems.  Return for 3-73m Botox.  I, Grayce Saunas, RMA, am acting as scribe for Rexene Rattler, MD .   Documentation: I have reviewed the above documentation for accuracy and completeness, and I agree with the above.  Rexene Rattler, MD      "

## 2024-04-12 NOTE — Patient Instructions (Signed)

## 2024-07-12 ENCOUNTER — Ambulatory Visit: Admitting: Dermatology

## 2024-08-03 ENCOUNTER — Encounter: Admitting: Dermatology
